# Patient Record
Sex: Male | Born: 1975 | Race: Black or African American | Hispanic: No | Marital: Married | Smoking: Current every day smoker
Health system: Southern US, Community
[De-identification: ages and names within clinical notes are randomized; demographics above are authoritative.]

## PROBLEM LIST (undated history)

## (undated) DIAGNOSIS — G4733 Obstructive sleep apnea (adult) (pediatric): Secondary | ICD-10-CM

## (undated) DIAGNOSIS — M1990 Unspecified osteoarthritis, unspecified site: Secondary | ICD-10-CM

## (undated) DIAGNOSIS — F419 Anxiety disorder, unspecified: Secondary | ICD-10-CM

## (undated) DIAGNOSIS — K5730 Diverticulosis of large intestine without perforation or abscess without bleeding: Secondary | ICD-10-CM

## (undated) DIAGNOSIS — E669 Obesity, unspecified: Secondary | ICD-10-CM

## (undated) DIAGNOSIS — F32A Depression, unspecified: Secondary | ICD-10-CM

## (undated) DIAGNOSIS — K7581 Nonalcoholic steatohepatitis (NASH): Secondary | ICD-10-CM

## (undated) DIAGNOSIS — I10 Essential (primary) hypertension: Secondary | ICD-10-CM

## (undated) DIAGNOSIS — R7303 Prediabetes: Secondary | ICD-10-CM

## (undated) DIAGNOSIS — K602 Anal fissure, unspecified: Secondary | ICD-10-CM

## (undated) HISTORY — DX: Obesity, unspecified: E66.9

## (undated) HISTORY — DX: Obstructive sleep apnea (adult) (pediatric): G47.33

## (undated) HISTORY — DX: Essential (primary) hypertension: I10

---

## 2005-10-07 ENCOUNTER — Emergency Department: Payer: Self-pay | Admitting: Emergency Medicine

## 2006-02-09 ENCOUNTER — Emergency Department (HOSPITAL_COMMUNITY): Admission: EM | Admit: 2006-02-09 | Discharge: 2006-02-09 | Payer: Self-pay | Admitting: Emergency Medicine

## 2007-02-12 IMAGING — CR DG HUMERUS 2V *L*
2 series · 2 of 2 positions shown · non-contrast
Comparison: None.

CLINICAL DATA: A pallet fell against the mid-humerus about two hours ago.  Now with upper arm and elbow pain.        
 LEFT HUMERUS - 2 VIEW:

[w humerus lat left *]
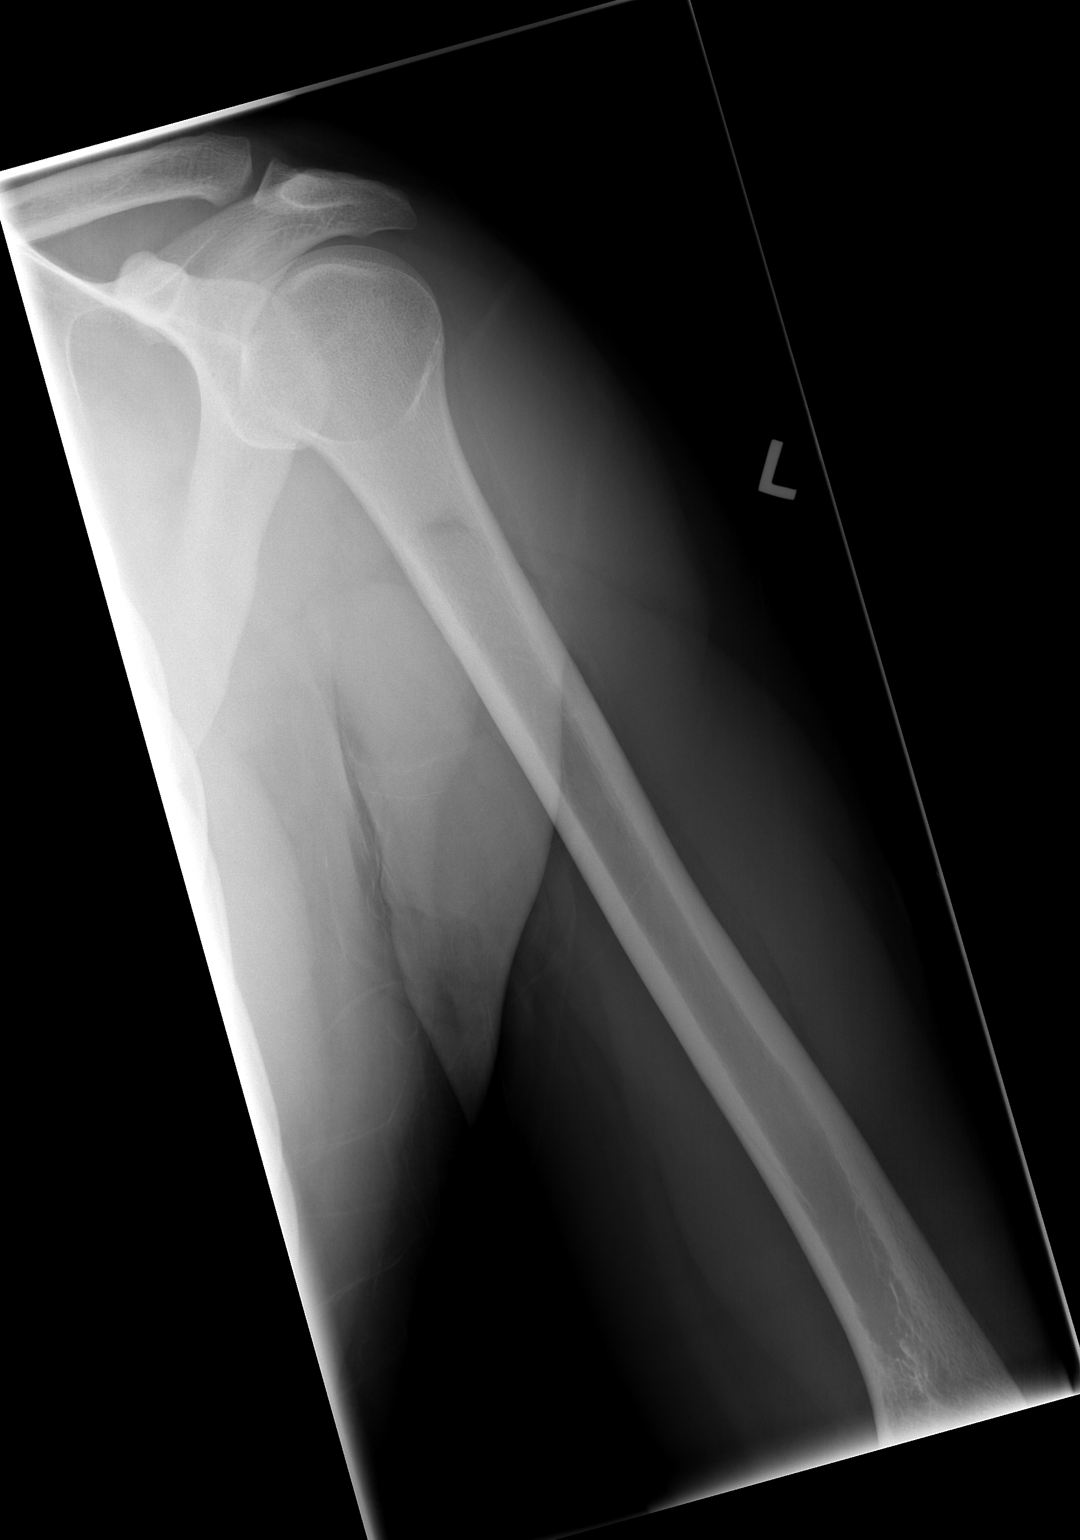

[w humerus ap left *]
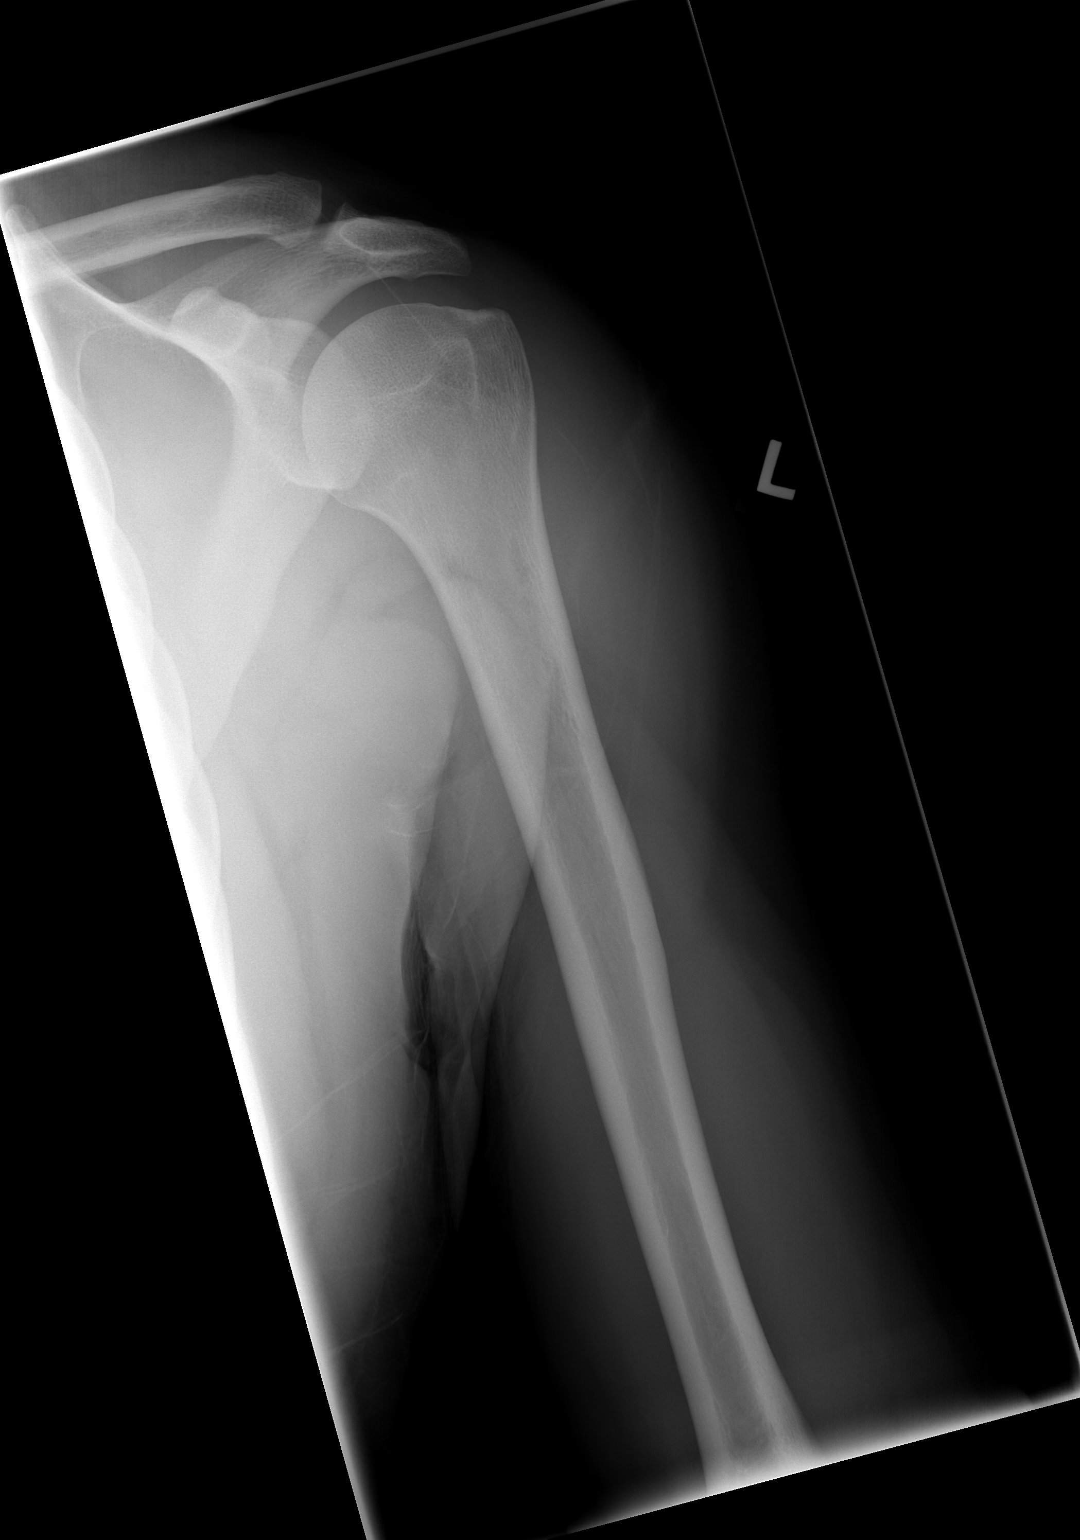

[2 of 2 positions shown; findings below may reference images not displayed]

The study is reviewed in the context of the accompanying elbow exam.  
 There is no evidence of fracture or other focal bone lesions.  Soft tissues are unremarkable.
IMPRESSION: Negative.

## 2007-04-22 ENCOUNTER — Emergency Department: Payer: Self-pay | Admitting: Emergency Medicine

## 2007-12-23 ENCOUNTER — Emergency Department: Payer: Self-pay | Admitting: Emergency Medicine

## 2009-06-20 ENCOUNTER — Emergency Department: Payer: Self-pay | Admitting: Emergency Medicine

## 2009-11-02 ENCOUNTER — Emergency Department: Payer: Self-pay | Admitting: Emergency Medicine

## 2009-11-30 ENCOUNTER — Emergency Department: Payer: Self-pay | Admitting: Emergency Medicine

## 2009-12-06 ENCOUNTER — Emergency Department: Payer: Self-pay | Admitting: Unknown Physician Specialty

## 2009-12-21 ENCOUNTER — Ambulatory Visit: Payer: Self-pay | Admitting: Surgery

## 2010-11-05 IMAGING — CT CT ABD-PELV W/ CM
1 of 2 series · 14 of 32 positions shown, 18 images · non-contrast
Comparison: none

REASON FOR EXAM: (1) left abd pain; (2) same
COMMENTS:

[Series 2: abdomen · axial · 0.81mm/px · z∈[-196,+244]mm · 14 of 98 slices shown, 18 images]
[im 5/98  soft-tissue]
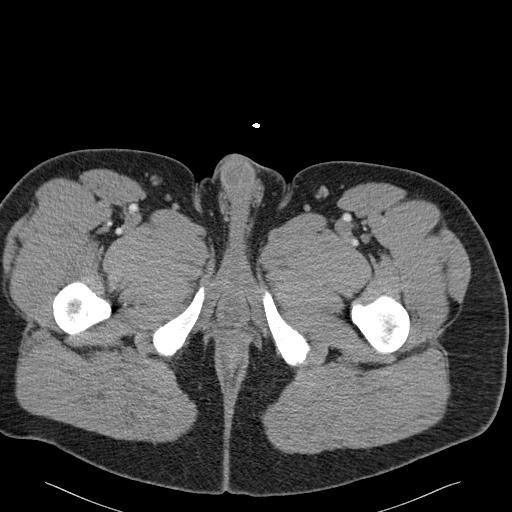
[im 5/98  bone]
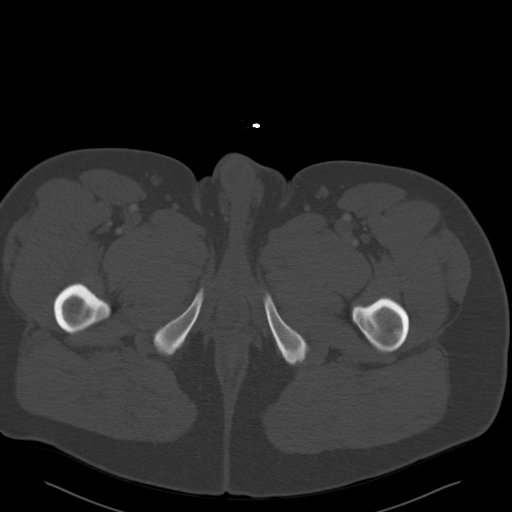
[im 13/98  soft-tissue]
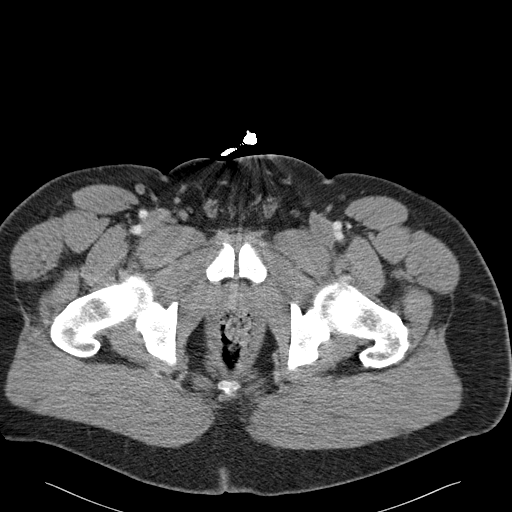
[im 21/98  soft-tissue]
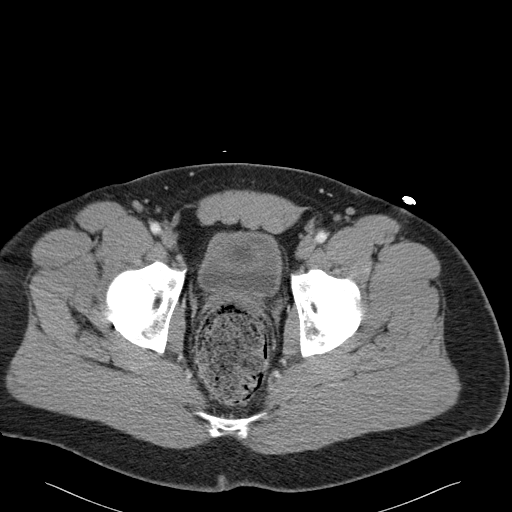
[im 29/98  soft-tissue]
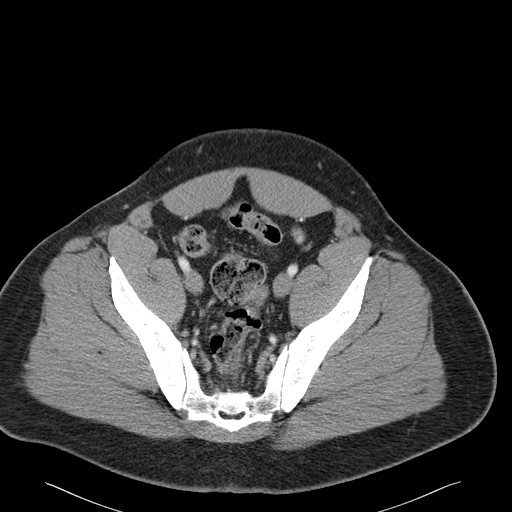
[im 37/98  soft-tissue]
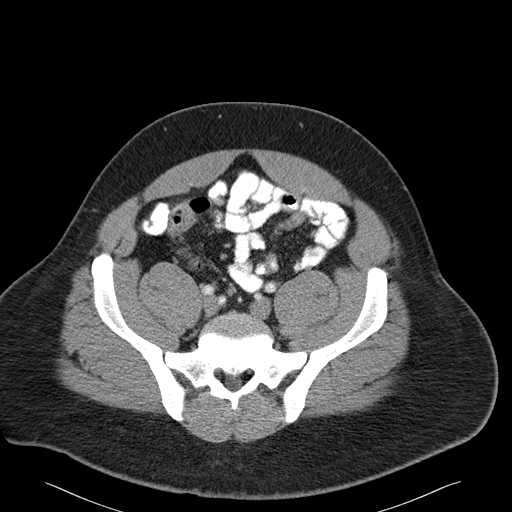
[im 45/98  soft-tissue]
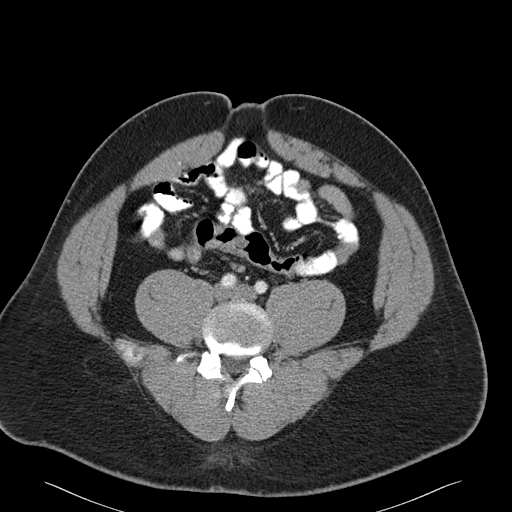
[im 53/98  soft-tissue]
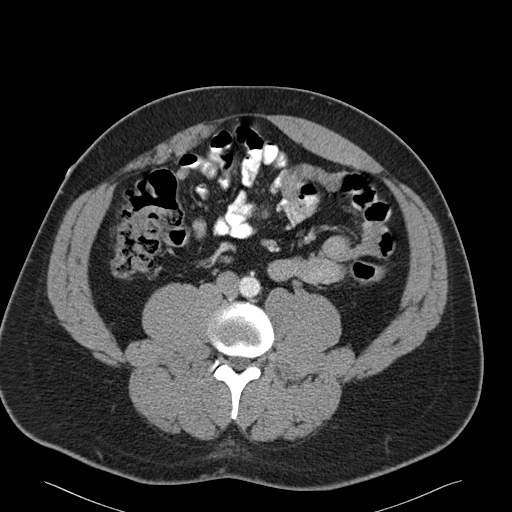
[im 61/98  soft-tissue]
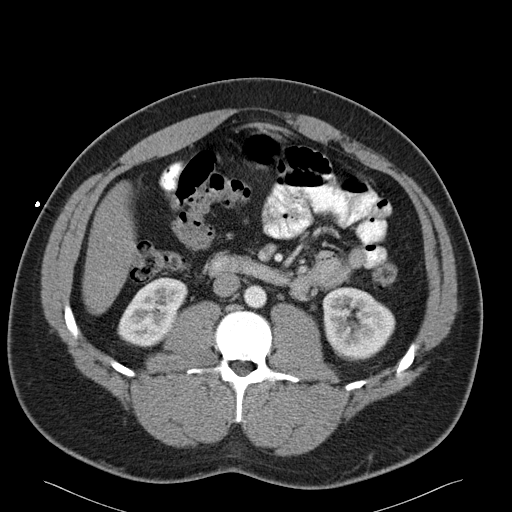
[im 69/98  soft-tissue]
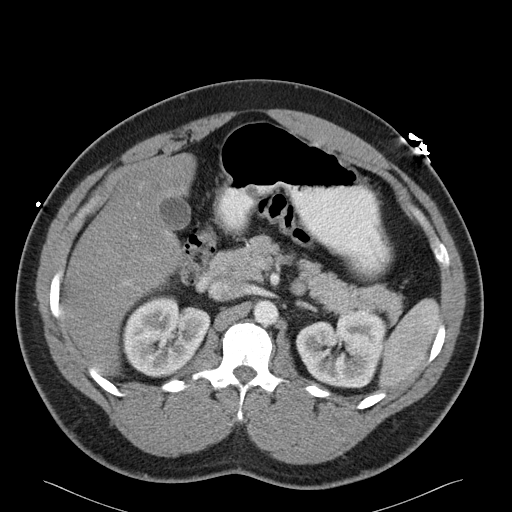
[im 69/98  bone]
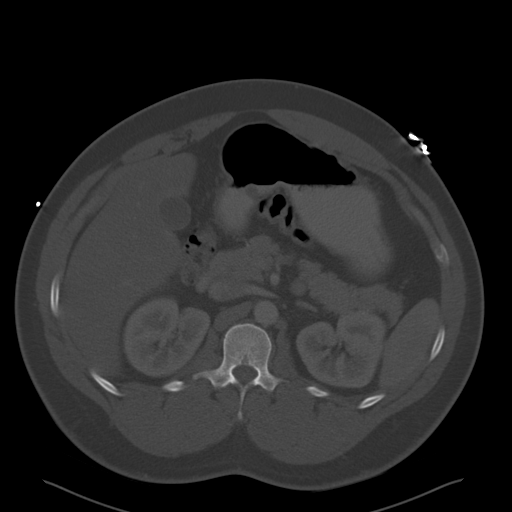
[im 77/98  soft-tissue]
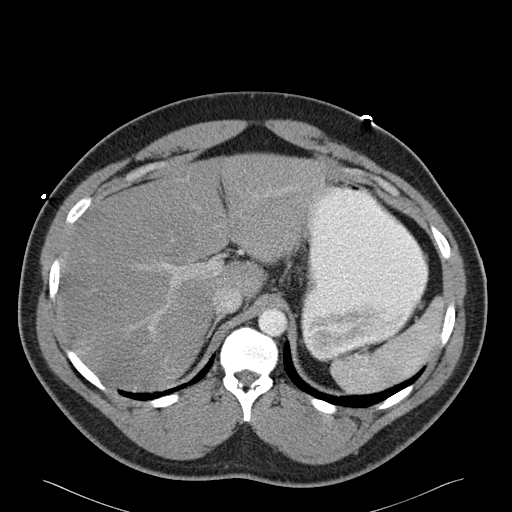
[im 81/98  lung]
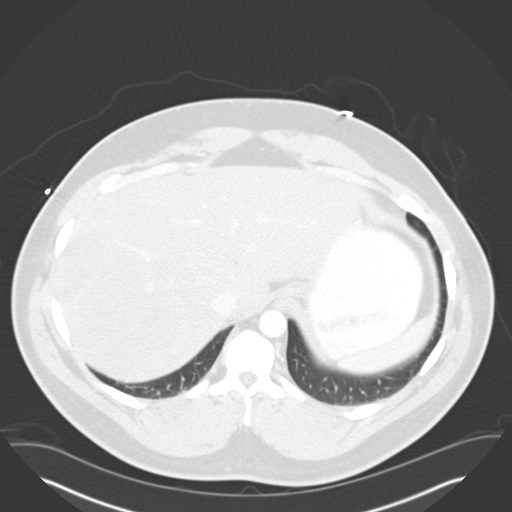
[im 85/98  soft-tissue]
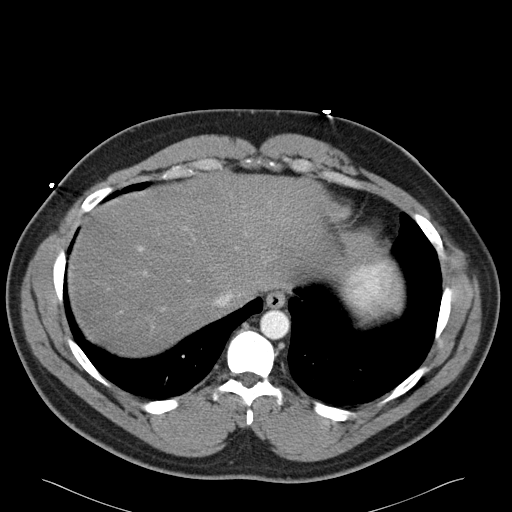
[im 85/98  lung]
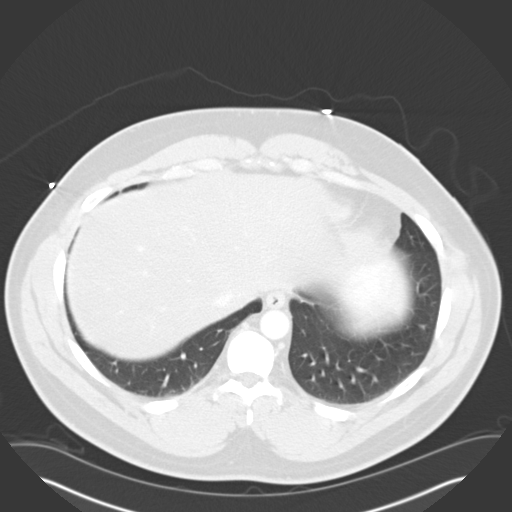
[im 89/98  lung]
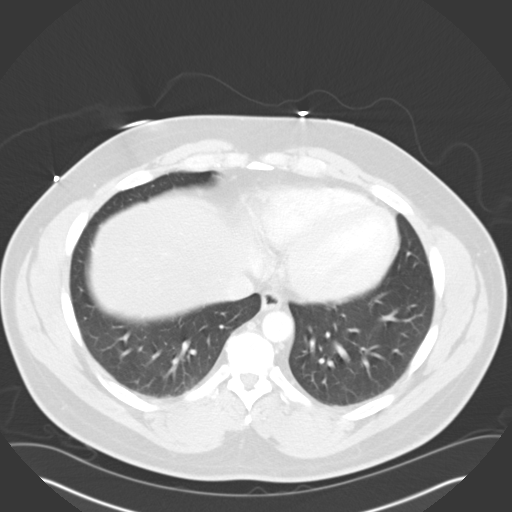
[im 93/98  soft-tissue]
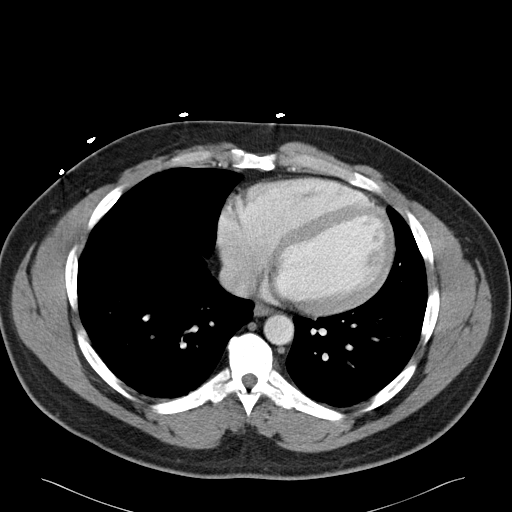
[im 93/98  lung]
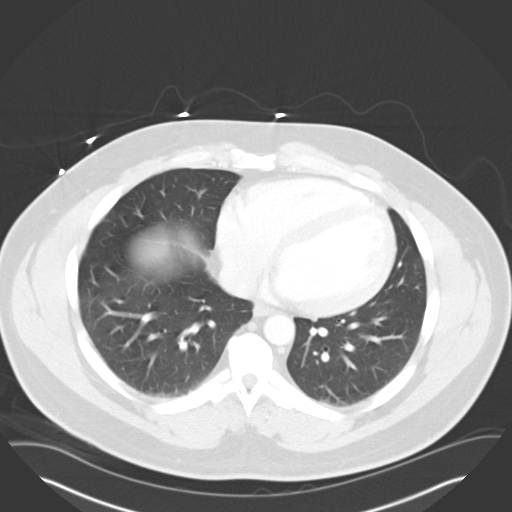

[14 of 32 positions shown; findings below may reference images not displayed]

PROCEDURE:     CT  - CT ABDOMEN / PELVIS  W  - November 02, 2009  [DATE]

RESULT:     Axial CT scanning was performed through the abdomen and pelvis
at 5 mm intervals and slice thicknesses. Comparison is made to a noncontrast
study 22 April, 2007. Review of 3-dimensional reconstructed images was
performed separately on the [employer] server monitor.

 The patient received oral contrast material as well as the IV contrast. The
orally administered contrast however has not traversed the entire small
bowel. The contrast and stool stool and gas pattern within the small bowel
is within the limits of normal. There is a broadly necked umbilical hernia
containing fat and a loop of normal appearing small bowel. The colon
exhibits a normal gas pattern. There is no evidence of obstruction or acute
inflammation. The stomach is moderately distended with fluid.

The spleen is normal in density and contour. The pancreas exhibits no acute
inflammatory change. The kidneys enhance well with no evidence of
obstruction. The perinephric fat is normal in appearance. The periaortic and
pericaval regions are normal in appearance. There are a few normal sized
inguinal lymph nodes present. I see no inguinal hernia.

The liver exhibits decreased density diffusely consistent with fatty
infiltration. The gallbladder is adequately distended with no evidence of
calcified stones. I see no adrenal masses. The caliber of the abdominal
aorta is normal. The partially distended urinary bladder is normal in
appearance. The seminal vesicles and prostate gland are normal in
appearance. The lumbar vertebral bodies are preserved in height. The lung
bases are clear.
IMPRESSION: 1. The study is limited due to lack of contrast within the colon but I do
not see evidence of acute inflammatory change of the small or large bowel.
2. The appendix is demonstrated and is normal in appearance. A few normal
sized lymph nodes in the mesentery in the right lower quadrant of the
abdomen are seen.
3. I do not see evidence of acute hepatobiliary abnormality nor acute
urinary tract abnormality.
4. There is a ventral hernia containing loops of normal appearing small
bowel.

## 2010-12-05 HISTORY — PX: UMBILICAL HERNIA REPAIR: SHX196

## 2014-04-25 ENCOUNTER — Encounter: Payer: Self-pay | Admitting: *Deleted

## 2014-04-29 ENCOUNTER — Ambulatory Visit (INDEPENDENT_AMBULATORY_CARE_PROVIDER_SITE_OTHER): Payer: BC Managed Care – PPO | Admitting: Neurology

## 2014-04-29 ENCOUNTER — Encounter: Payer: Self-pay | Admitting: Neurology

## 2014-04-29 VITALS — BP 148/90 | HR 78 | Ht 70.47 in | Wt 272.2 lb

## 2014-04-29 DIAGNOSIS — R202 Paresthesia of skin: Secondary | ICD-10-CM

## 2014-04-29 DIAGNOSIS — H539 Unspecified visual disturbance: Secondary | ICD-10-CM

## 2014-04-29 DIAGNOSIS — R209 Unspecified disturbances of skin sensation: Secondary | ICD-10-CM

## 2014-04-29 DIAGNOSIS — R269 Unspecified abnormalities of gait and mobility: Secondary | ICD-10-CM

## 2014-04-29 LAB — VITAMIN B12: Vitamin B-12: 467 pg/mL (ref 211–911)

## 2014-04-29 LAB — SEDIMENTATION RATE: Sed Rate: 1 mm/hr (ref 0–16)

## 2014-04-29 LAB — C-REACTIVE PROTEIN

## 2014-04-29 NOTE — Progress Notes (Signed)
Note faxed.

## 2014-04-29 NOTE — Patient Instructions (Addendum)
1.  MRI brain wwo contrast 2.  EMG of the right arm and leg 3.  Check blood work 4.  Return to clinic in 54-months  ELECTROMYOGRAM AND NERVE CONDUCTION STUDIES (EMG/NCS) INSTRUCTIONS  How to Prepare The neurologist conducting the EMG will need to know if you have certain medical conditions. Tell the neurologist and other EMG lab personnel if you:   Have a pacemaker or any other electrical medical device   Take blood-thinning medications   Have hemophilia, a blood-clotting disorder that causes prolonged bleeding Bathing Take a shower or bath shortly before your exam in order to remove oils from your skin. Don't apply lotions or creams before the exam.  What to Expect You'll likely be asked to change into a hospital gown for the procedure and lie down on an examination table. The following explanations can help you understand what will happen during the exam.    Electrodes. The neurologist or a technician places surface electrodes at various locations on your skin depending on where you're experiencing symptoms. Or the neurologist may insert needle electrodes at different sites depending on your symptoms.    Sensations. The electrodes will at times transmit a tiny electrical current that you may feel as a twinge or spasm. The needle electrode may cause discomfort or pain that usually ends shortly after the needle is removed. If you are concerned about discomfort or pain, you may want to talk to the neurologist about taking a short break during the exam.    Instructions. During the needle EMG, the neurologist will assess whether there is any spontaneous electrical activity when the muscle is at rest - activity that isn't present in healthy muscle tissue - and the degree of activity when you slightly contract the muscle.  He or she will give you instructions on resting and contracting a muscle at appropriate times. Depending on what muscles and nerves the neurologist is examining, he or she may ask  you to change positions during the exam.  After your EMG You may experience some temporary, minor bruising where the needle electrode was inserted into your muscle. This bruising should fade within several days. If it persists, contact your primary care doctor.

## 2014-04-29 NOTE — Progress Notes (Signed)
[facility] Clinic Note - Initial Visit   Date: 04/29/2014  Shane Erickson MRN: [id number] DOB: Mar 08, 1976   Dear Shane Levering, PA-C:  Thank you for your kind referral of Shane Erickson for consultation of numbness/tingling of the extremities. Although his history is well known to you, please allow [place] to reiterate it for the purpose of our medical record. The patient was accompanied to the clinic by self who also provides collateral information.     History of Present Illness: Shane Erickson is a 38 y.o. right-handed [place] American male with history of hyperlipidemia, depression, hypertension, OSA, and obesity presenting for evaluation of numbness and tingling of the arms and legs.    He works as a Administrator and on May 44IH, 2015 he felt as if he was having heart attack and call his primary care office who recommended that he gets is blood pressure checked which was 165/103 and go to the nearest emergency department. He was told that his labs were normal, but that he was dehydrated.  Soon afterwards, he developed numbness and tingling involving his his shoulders, arms, fingers, thighs, knees, and feet.  Symptoms are sporadic and intermittent, lasting all day but varying in location.  He denies any exacerbating or alleviating factors.  He also complains of achiness and tingling of his ankles, unsteady gait, and forgetfulness.  He is stumbling more frequently, but denies any weakness.  Denies any loss of vision, but does complain of blurry vision.    No family history of neuropathy.  His sister and aunt have multiple sclerosis.    Out-side paper records, electronic medical record, and images have been reviewed where available and summarized as:  Labs 04/10/2014:  Na 138, potassium 4.1, Glu 87, BUN 12, Cr 1.23, AST 18, ALT 20    Past Medical History  Diagnosis Date  . High cholesterol   . Chronic depression   . Hypertension   . OSA (obstructive sleep apnea)   .  Obesity     Past Surgical History  Procedure Laterality Date  . Umbilical hernia repair  2012     Medications:  Current Outpatient Prescriptions on File Prior to Visit  Medication Sig Dispense Refill  . buPROPion (WELLBUTRIN XL) 300 MG 24 hr tablet Take 300 mg by mouth daily.      . clobetasol cream (TEMOVATE) 4.74 % Apply 1 application topically 2 (two) times daily.      Marland Kitchen lisinopril-hydrochlorothiazide (PRINZIDE,ZESTORETIC) 20-12.5 MG per tablet Take 1 tablet by mouth daily.      Marland Kitchen OVER THE COUNTER MEDICATION Testosterone Booster [employer] Product 2 tablets twice a day       No current facility-administered medications on file prior to visit.    Allergies:  Allergies  Allergen Reactions  . Chapstick   . Lip Balm   . Sunscreens     Family History: Family History  Problem Relation Age of Onset  . Kidney disease Father   . CVA Father   . Heart attack Father   . Hypertension Father   . CAD Father   . Thyroid disease Mother   . Multiple sclerosis Sister   . CAD Paternal Uncle     Social History: History   Social History  . Marital Status: Legally Separated    Spouse Name: N/A    Number of Children: N/A  . Years of Education: N/A   Occupational History  . truck driver    Social History Main Topics  . Smoking status: Former  Smoker  . Smokeless tobacco: Never Used  . Alcohol Use: Yes  . Drug Use: Yes    Special: Marijuana, Cocaine, Methamphetamines  . Sexual Activity: No     Comment: abstinate since his 20's    Other Topics Concern  . Not on file   Social History Narrative  . No narrative on file    Review of Systems:  CONSTITUTIONAL: No fevers, chills, night sweats, or weight loss.   EYES: + blurry visual changes, no eye pain ENT: No hearing changes.  No history of nose bleeds.   RESPIRATORY: No cough, wheezing and shortness of breath.   CARDIOVASCULAR: Negative for chest pain, and palpitations.   GI: Negative for abdominal discomfort, blood in stools  or black stools.  No recent change in bowel habits.   GU:  No history of incontinence.   MUSCLOSKELETAL: No history of joint pain or swelling.  + myalgias.   SKIN: Negative for lesions, rash, and itching.   HEMATOLOGY/ONCOLOGY: Negative for prolonged bleeding, bruising easily, and swollen nodes.  No history of cancer.   ENDOCRINE: Negative for cold or heat intolerance, polydipsia or goiter.   PSYCH:  +depression or anxiety symptoms.   NEURO: As Above.   Vital Signs:  BP 148/90  Pulse 78  Ht 5' 10.47" (1.79 m)  Wt 272 lb 4 oz (123.492 kg)  BMI 38.54 kg/m2  SpO2 96%   General Medical Exam:   General:  Well appearing, comfortable.   Eyes/ENT: see cranial nerve examination.   Neck: No masses appreciated.  Full range of motion without tenderness.  No carotid bruits. Lhermitte's sign negative.  Respiratory:  Clear to auscultation, good air entry bilaterally.   Cardiac:  Regular rate and rhythm, no murmur.   Extremities:  No deformities, edema, or skin discoloration. Good capillary refill.   Skin:  Skin color, texture, turgor normal. No rashes or lesions.  Neurological Exam: MENTAL STATUS including orientation to time, place, person, recent and remote memory, attention span and concentration, language, and fund of knowledge is normal.  Speech is not dysarthric.  CRANIAL NERVES: II:  No visual field defects.  Unremarkable fundi.   III-IV-VI: Pupils equal round and reactive to light. No APD or INO. Normal conjugate, extra-ocular eye movements in all directions of gaze.  No nystagmus.  No ptosis.   V:  Normal facial sensation.   VII:  Normal facial symmetry and movements.  No pathologic facial reflexes.  VIII:  Normal hearing and vestibular function.   IX-X:  Normal palatal movement.   XI:  Normal shoulder shrug and head rotation.   XII:  Normal tongue strength and range of motion, no deviation or fasciculation.  MOTOR:  No atrophy, fasciculations or abnormal movements.  No pronator  drift.  Tone is normal.    Right Upper Extremity:    Left Upper Extremity:    Deltoid  5/5   Deltoid  5/5   Biceps  5/5   Biceps  5/5   Triceps  5/5   Triceps  5/5   Wrist extensors  5/5   Wrist extensors  5/5   Wrist flexors  5/5   Wrist flexors  5/5   Finger extensors  5/5   Finger extensors  5/5   Finger flexors  5/5   Finger flexors  5/5   Dorsal interossei  5/5   Dorsal interossei  5/5   Abductor pollicis  5/5   Abductor pollicis  5/5   Tone (Ashworth scale)  0  Tone (  Ashworth scale)  0   Right Lower Extremity:    Left Lower Extremity:    Hip flexors  5/5   Hip flexors  5/5   Hip extensors  5/5   Hip extensors  5/5   Knee flexors  5/5   Knee flexors  5/5   Knee extensors  5/5   Knee extensors  5/5   Dorsiflexors  5/5   Dorsiflexors  5/5   Plantarflexors  5/5   Plantarflexors  5/5   Toe extensors  5/5   Toe extensors  5/5   Toe flexors  5/5   Toe flexors  5/5   Tone (Ashworth scale)  0  Tone (Ashworth scale)  0   MSRs:  Right                                                                 Left brachioradialis 2+  brachioradialis 2+  biceps 2+  biceps 2+  triceps 2+  triceps 2+  patellar 2+  patellar 2+  ankle jerk 2+  ankle jerk 2+  Hoffman no  Hoffman no  plantar response down  plantar response down   SENSORY:  Diminished temperature in the feet bilaterally, otherwise normal and symmetric perception of light touch, pinprick, vibration, and proprioception.  Romberg's sign absent.   COORDINATION/GAIT: Normal finger-to- nose-finger and heel-to-shin.  Intact rapid alternating movements bilaterally.  Able to rise from a chair without using arms.  Gait slow, narrow based and stable. Tandem and stressed gait intact.    IMPRESSION: Mr. Lundberg is a 38 year-old man presenting for evaluation of generalized paresthesias.  His neurological examination is notable for diminished temperature in the feet, but otherwise intact.  Given the fluctuating nature of his symptoms, I will check  for demyelinating disease with MRI brain.  Generalized and sporadic paresthesias is atypical presentation for peripheral neuropathy, but with his relatively young age, it is prudent to check for neuropathy labs (including autoimmune disease, diabetes, sarcoidosis) and obtain NCS/EMG if imaging is nondiagnostic.   PLAN/RECOMMENDATIONS:  1.  MRI brain wwo contrast 2.  EMG of the right arm and leg, if imaging is negative 3.  Check ESR, CRP, ANA, ENA, ACE, vitamin B12, vitamin D25, 2hr-GTT 4.  Return to clinic in 26-month   The duration of this appointment visit was 40 minutes of face-to-face time with the patient.  Greater than 50% of this time was spent in counseling, explanation of diagnosis, planning of further management, and coordination of care.   Thank you for allowing me to participate in patient's care.  If I can answer any additional questions, I would be pleased to do so.    Sincerely,    Donika K. PPosey Pronto DO

## 2014-04-30 LAB — ANA: Anti Nuclear Antibody(ANA): POSITIVE — AB

## 2014-04-30 LAB — ANGIOTENSIN CONVERTING ENZYME: ANGIOTENSIN-CONVERTING ENZYME: 3 U/L — AB (ref 8–52)

## 2014-04-30 LAB — ENA 9 PANEL
CENTROMERE AB SCREEN: NEGATIVE
ENA SM Ab Ser-aCnc: <1
Jo-1 Antibody, IgG: <1
RIBOSOMAL P PROTEIN AB: NEGATIVE
SM/RNP: <1
SSA (RO) (ENA) ANTIBODY, IGG: NEGATIVE
SSB (La) (ENA) Antibody, IgG: <1
Scleroderma (Scl-70) (ENA) Antibody, IgG: <1

## 2014-04-30 LAB — VITAMIN D 25 HYDROXY (VIT D DEFICIENCY, FRACTURES): Vit D, 25-Hydroxy: 32 ng/mL (ref 30–89)

## 2014-04-30 LAB — ANTI-NUCLEAR AB-TITER (ANA TITER): ANA Titer 1: NEGATIVE

## 2014-05-02 ENCOUNTER — Ambulatory Visit: Payer: Self-pay | Admitting: Neurology

## 2014-05-13 ENCOUNTER — Ambulatory Visit (HOSPITAL_COMMUNITY)
Admission: RE | Admit: 2014-05-13 | Discharge: 2014-05-13 | Disposition: A | Discharge status: Home / Self Care | Payer: BC Managed Care – PPO | Source: Ambulatory Visit | Attending: Neurology | Admitting: Neurology

## 2014-05-13 DIAGNOSIS — H539 Unspecified visual disturbance: Secondary | ICD-10-CM

## 2014-05-13 DIAGNOSIS — J3489 Other specified disorders of nose and nasal sinuses: Secondary | ICD-10-CM | POA: Insufficient documentation

## 2014-05-13 DIAGNOSIS — R269 Unspecified abnormalities of gait and mobility: Secondary | ICD-10-CM | POA: Insufficient documentation

## 2014-05-13 DIAGNOSIS — R209 Unspecified disturbances of skin sensation: Secondary | ICD-10-CM

## 2014-05-13 MED ORDER — GADOBENATE DIMEGLUMINE 529 MG/ML IV SOLN
20.0000 mL | Freq: Once | INTRAVENOUS | Status: AC
  Administered 2014-05-13: 20 mL via INTRAVENOUS

## 2014-06-30 ENCOUNTER — Encounter: Payer: BC Managed Care – PPO | Admitting: Neurology

## 2014-07-01 ENCOUNTER — Encounter: Payer: Self-pay | Admitting: Neurology

## 2014-07-01 ENCOUNTER — Telehealth: Payer: Self-pay | Admitting: Neurology

## 2014-07-01 NOTE — Telephone Encounter (Signed)
Pt no showed EMG study scheduled for 06/30/14. No show letter mailed to pt. Pt still has a follow up scheduled w/ Dr. Allena KatzPatel 07/04/14 / Sherri S.

## 2014-07-03 ENCOUNTER — Telehealth: Payer: Self-pay | Admitting: Neurology

## 2014-07-03 NOTE — Telephone Encounter (Signed)
Pt called at 8:38PM on 07/02/2014 to cancel his 07/04/14 appt. His reason was due to him being out of town working, will call back to r/s

## 2014-07-04 ENCOUNTER — Ambulatory Visit: Payer: BC Managed Care – PPO | Admitting: Neurology

## 2015-11-09 ENCOUNTER — Emergency Department (HOSPITAL_COMMUNITY)
Admission: EM | Admit: 2015-11-09 | Discharge: 2015-11-09 | Disposition: A | Discharge status: Home / Self Care | Payer: Worker's Compensation | Attending: Emergency Medicine | Admitting: Emergency Medicine

## 2015-11-09 ENCOUNTER — Encounter (HOSPITAL_COMMUNITY): Payer: Self-pay | Admitting: *Deleted

## 2015-11-09 DIAGNOSIS — S8992XA Unspecified injury of left lower leg, initial encounter: Secondary | ICD-10-CM | POA: Insufficient documentation

## 2015-11-09 DIAGNOSIS — Z79899 Other long term (current) drug therapy: Secondary | ICD-10-CM | POA: Insufficient documentation

## 2015-11-09 DIAGNOSIS — M545 Low back pain, unspecified: Secondary | ICD-10-CM

## 2015-11-09 DIAGNOSIS — Y990 Civilian activity done for income or pay: Secondary | ICD-10-CM | POA: Insufficient documentation

## 2015-11-09 DIAGNOSIS — W1839XA Other fall on same level, initial encounter: Secondary | ICD-10-CM | POA: Insufficient documentation

## 2015-11-09 DIAGNOSIS — Z8659 Personal history of other mental and behavioral disorders: Secondary | ICD-10-CM | POA: Insufficient documentation

## 2015-11-09 DIAGNOSIS — S8991XA Unspecified injury of right lower leg, initial encounter: Secondary | ICD-10-CM | POA: Insufficient documentation

## 2015-11-09 DIAGNOSIS — F17200 Nicotine dependence, unspecified, uncomplicated: Secondary | ICD-10-CM | POA: Insufficient documentation

## 2015-11-09 DIAGNOSIS — E669 Obesity, unspecified: Secondary | ICD-10-CM | POA: Insufficient documentation

## 2015-11-09 DIAGNOSIS — Z8669 Personal history of other diseases of the nervous system and sense organs: Secondary | ICD-10-CM | POA: Insufficient documentation

## 2015-11-09 DIAGNOSIS — Y9389 Activity, other specified: Secondary | ICD-10-CM | POA: Insufficient documentation

## 2015-11-09 DIAGNOSIS — Z7952 Long term (current) use of systemic steroids: Secondary | ICD-10-CM | POA: Insufficient documentation

## 2015-11-09 DIAGNOSIS — Y9289 Other specified places as the place of occurrence of the external cause: Secondary | ICD-10-CM | POA: Insufficient documentation

## 2015-11-09 DIAGNOSIS — I10 Essential (primary) hypertension: Secondary | ICD-10-CM | POA: Insufficient documentation

## 2015-11-09 DIAGNOSIS — M5416 Radiculopathy, lumbar region: Secondary | ICD-10-CM | POA: Insufficient documentation

## 2015-11-09 MED ORDER — DEXAMETHASONE SODIUM PHOSPHATE 4 MG/ML IJ SOLN
10.0000 mg | Freq: Once | INTRAMUSCULAR | Status: AC
  Administered 2015-11-09: 10 mg via INTRAMUSCULAR
  Filled 2015-11-09: qty 3

## 2015-11-09 MED ORDER — DIAZEPAM 5 MG/ML IJ SOLN
10.0000 mg | Freq: Once | INTRAMUSCULAR | Status: AC
  Administered 2015-11-09: 10 mg via INTRAMUSCULAR
  Filled 2015-11-09: qty 2

## 2015-11-09 MED ORDER — FAMOTIDINE 20 MG PO TABS: 20.0000 mg | ORAL_TABLET | Freq: Two times a day (BID) | ORAL | Status: DC

## 2015-11-09 MED ORDER — NAPROXEN 500 MG PO TABS: 500.0000 mg | ORAL_TABLET | Freq: Two times a day (BID) | ORAL | Status: DC

## 2015-11-09 MED ORDER — PREDNISONE 20 MG PO TABS: ORAL_TABLET | ORAL | Status: DC

## 2015-11-09 MED ORDER — CYCLOBENZAPRINE HCL 10 MG PO TABS: 10.0000 mg | ORAL_TABLET | Freq: Three times a day (TID) | ORAL | Status: DC | PRN

## 2015-11-09 MED ORDER — KETOROLAC TROMETHAMINE 60 MG/2ML IM SOLN
60.0000 mg | Freq: Once | INTRAMUSCULAR | Status: AC
  Administered 2015-11-09: 60 mg via INTRAMUSCULAR
  Filled 2015-11-09: qty 2

## 2015-11-09 NOTE — ED Notes (Signed)
Pt up to restroom with wifes assistance

## 2015-11-09 NOTE — ED Provider Notes (Signed)
CSN: [id number]     Arrival date & time 11/09/15  0159 History   First MD Initiated Contact with Patient 11/09/15 0225    Chief Complaint  Patient presents with  . Back Pain     (Consider location/radiation/quality/duration/timing/severity/associated sxs/prior Treatment) HPI patient states "I think I broke my back". He states he had gotten to work and he was reaching over to pick up some keys out of a box in a secure area and he felt a pop in his lower back. He states he fell to his knees due to the pain. This was about 11:45 PM. He describes the pain as pressure and aching and states it goes down both his legs into his calves but is worse on the left than the right. He states moving his left leg or standing up makes the pain worse. He states nothing makes it feel better however he is sitting on the side of the bed leaning over and he states that helps minimally. He denies any nausea or vomiting. He denies any urinary rectal incontinence. He states he's never had back problems in the past.   PCP Dr Yehuda Mao  Past Medical History  Diagnosis Date  . High cholesterol   . Chronic depression   . Hypertension   . OSA (obstructive sleep apnea)   . Obesity    Past Surgical History  Procedure Laterality Date  . Umbilical hernia repair  2012   Family History  Problem Relation Age of Onset  . Kidney disease Father   . CVA Father   . Heart attack Father   . Hypertension Father   . CAD Father   . Thyroid disease Mother   . Multiple sclerosis Sister   . CAD Paternal Uncle    Social History  Substance Use Topics  . Smoking status: Current Every Day Smoker -- 0.33 packs/day  . Smokeless tobacco: Never Used  . Alcohol Use: Yes  employed Lives with spouse   Review of Systems  All other systems reviewed and are negative.     Allergies  Chapstick; Lip balm; and Sunscreens  Home Medications   Prior to Admission medications   Medication Sig Start Date End Date Taking? Authorizing  Provider  clobetasol cream (TEMOVATE) 0.05 % Apply 1 application topically 2 (two) times daily.    Historical Provider, MD  cyclobenzaprine (FLEXERIL) 10 MG tablet Take 1 tablet (10 mg total) by mouth 3 (three) times daily as needed for muscle spasms. 11/09/15   Devoria Albe, MD  famotidine (PEPCID) 20 MG tablet Take 1 tablet (20 mg total) by mouth 2 (two) times daily. 11/09/15   Devoria Albe, MD  lisinopril-hydrochlorothiazide (PRINZIDE,ZESTORETIC) 20-12.5 MG per tablet Take 1 tablet by mouth daily.    Historical Provider, MD  naproxen (NAPROSYN) 500 MG tablet Take 1 tablet (500 mg total) by mouth 2 (two) times daily. 11/09/15   Devoria Albe, MD  OVER THE COUNTER MEDICATION Testosterone Booster [employer] Product 2 tablets twice a day    Historical Provider, MD  predniSONE (DELTASONE) 20 MG tablet Take 3 po QD x 3d , then 2 po QD x 3d then 1 po QD x 3d 11/09/15   Devoria Albe, MD   BP 128/78 mmHg  Pulse 77  Temp(Src) 98.4 F (36.9 C) (Oral)  Resp 20  Ht 6' (1.829 m)  Wt 290 lb (131.543 kg)  BMI 39.32 kg/m2  SpO2 100%  Vital signs normal   Physical Exam  Constitutional: He is oriented to person, place, and  time. He appears well-developed and well-nourished.  Non-toxic appearance. He does not appear ill. No distress.  Patient is sitting on the side of the bed trying to hold still and not moved.  HENT:  Head: Normocephalic and atraumatic.  Right Ear: External ear normal.  Left Ear: External ear normal.  Nose: Nose normal. No mucosal edema or rhinorrhea.  Mouth/Throat: Oropharynx is clear and moist and mucous membranes are normal. No dental abscesses or uvula swelling.  Eyes: Conjunctivae and EOM are normal. Pupils are equal, round, and reactive to light.  Neck: Normal range of motion and full passive range of motion without pain. Neck supple.  Cardiovascular: Normal rate, regular rhythm and normal heart sounds.  Exam reveals no gallop and no friction rub.   No murmur heard. Pulmonary/Chest: Effort normal  and breath sounds normal. No respiratory distress. He has no wheezes. He has no rhonchi. He has no rales. He exhibits no tenderness and no crepitus.  Abdominal: Soft. Normal appearance and bowel sounds are normal. He exhibits no distension. There is no tenderness. There is no rebound and no guarding.  Musculoskeletal: Normal range of motion. He exhibits no edema or tenderness.  Patient is tender diffusely in his lower lumbar spine but he is extremely tender over the large paraspinous lumbar muscles masses and he does not like to do range of motion because it causes pain. He has not had much pain over the SI joints bilaterally. His patellar reflexes are 0 bilaterally however he is very tense and even with distraction they were reflexes were 0. Patient has bilateral pain with straight leg raising worse on the left than the right.  Neurological: He is alert and oriented to person, place, and time. He has normal strength. No cranial nerve deficit.  Skin: Skin is warm, dry and intact. No rash noted. No erythema. No pallor.  Psychiatric: He has a normal mood and affect. His speech is normal and behavior is normal. His mood appears not anxious.  Nursing note and vitals reviewed.   ED Course  Procedures (including critical care time)  Medications  diazepam (VALIUM) injection 10 mg (10 mg Intramuscular Given 11/09/15 0315)  dexamethasone (DECADRON) injection 10 mg (10 mg Intramuscular Given 11/09/15 0316)  ketorolac (TORADOL) injection 60 mg (60 mg Intramuscular Given 11/09/15 0316)   Patient was given Valium IM, Decadron IM, and Toradol for pain. X-rays were not done since there was no trauma that would have caused a lumbar spine fracture. We discussed if he's not improving he may need a MRI in the future however with the acute pain most patients do improve with conservative treatment.   Recheck 04:10 patient is laying flat on the stretcher sleeping. His pain is improved.    MDM   Final diagnoses:   Acute low back pain  Lumbar radiculopathy    New Prescriptions   CYCLOBENZAPRINE (FLEXERIL) 10 MG TABLET    Take 1 tablet (10 mg total) by mouth 3 (three) times daily as needed for muscle spasms.   FAMOTIDINE (PEPCID) 20 MG TABLET    Take 1 tablet (20 mg total) by mouth 2 (two) times daily.   NAPROXEN (NAPROSYN) 500 MG TABLET    Take 1 tablet (500 mg total) by mouth 2 (two) times daily.   PREDNISONE (DELTASONE) 20 MG TABLET    Take 3 po QD x 3d , then 2 po QD x 3d then 1 po QD x 3d    Plan discharge  Devoria Albe, MD, Armando Gang  Devoria AlbeIva Cady Hafen, MD 11/09/15 [phone]

## 2015-11-09 NOTE — ED Notes (Signed)
Pt states he was bent down to put keys in work box and when he lifted back up he heard a pop and is having severe pain in lower back and down bilateral legs; pt states the pain is worse going down his left leg

## 2015-11-09 NOTE — Discharge Instructions (Signed)
Use ice and heat on your back. Take the cyclobenzprine, prednisone and pepcid until gone. When the prednisone is gone you can continue on the naproxen. Follow up with workman's comp today.    Back Injury Prevention Back injuries can be very painful. They can also be difficult to heal. After having one back injury, you are more likely to injure your back again. It is important to learn how to avoid injuring or re-injuring your back. The following tips can help you to prevent a back injury. WHAT SHOULD I KNOW ABOUT PHYSICAL FITNESS?  Exercise for 30 minutes per day on most days of the week or as told by your doctor. Make sure to:  Do aerobic exercises, such as walking, jogging, biking, or swimming.  Do exercises that increase balance and strength, such as tai chi and yoga.  Do stretching exercises. This helps with flexibility.  Try to develop strong belly (abdominal) muscles. Your belly muscles help to support your back.  Stay at a healthy weight. This helps to decrease your risk of a back injury. WHAT SHOULD I KNOW ABOUT MY DIET?  Talk with your doctor about your overall diet. Take supplements and vitamins only as told by your doctor.  Talk with your doctor about how much calcium and vitamin D you need each day. These nutrients help to prevent weakening of the bones (osteoporosis).  Include good sources of calcium in your diet, such as:  Dairy products.  Green leafy vegetables.  Products that have had calcium added to them (fortified).  Include good sources of vitamin D in your diet, such as:  Milk.  Foods that have had vitamin D added to them. WHAT SHOULD I KNOW ABOUT MY POSTURE?  Sit up straight and stand up straight. Avoid leaning forward when you sit or hunching over when you stand.  Choose chairs that have good low-back (lumbar) support.  If you work at a desk, sit close to it so you do not need to lean over. Keep your chin tucked in. Keep your neck drawn back. Keep  your elbows bent so your arms look like the letter "L" (right angle).  Sit high and close to the steering wheel when you drive. Add a low-back support to your car seat, if needed.  Avoid sitting or standing in one position for very long. Take breaks to get up, stretch, and walk around at least one time every hour. Take breaks every hour if you are driving for long periods of time.  Sleep on your side with your knees slightly bent, or sleep on your back with a pillow under your knees. Do not lie on the front of your body to sleep. WHAT SHOULD I KNOW ABOUT LIFTING, TWISTING, AND REACHING Lifting and Heavy Lifting  Avoid heavy lifting, especially lifting over and over again. If you must do heavy lifting:  Stretch before lifting.  Work slowly.  Rest between lifts.  Use a tool such as a cart or a dolly to move objects if one is available.  Make several small trips instead of carrying one heavy load.  Ask for help when you need it, especially when moving big objects.  Follow these steps when lifting:  Stand with your feet shoulder-width apart.  Get as close to the object as you can. Do not pick up a heavy object that is far from your body.  Use handles or lifting straps if they are available.  Bend at your knees. Squat down, but keep your heels off  the floor.  Keep your shoulders back. Keep your chin tucked in. Keep your back straight.  Lift the object slowly while you tighten the muscles in your legs, belly, and butt. Keep the object as close to the center of your body as possible.  Follow these steps when putting down a heavy load:  Stand with your feet shoulder-width apart.  Lower the object slowly while you tighten the muscles in your legs, belly, and butt. Keep the object as close to the center of your body as possible.  Keep your shoulders back. Keep your chin tucked in. Keep your back straight.  Bend at your knees. Squat down, but keep your heels off the floor.  Use  handles or lifting straps if they are available. Twisting and Reaching  Avoid lifting heavy objects above your waist.  Do not twist at your waist while you are lifting or carrying a load. If you need to turn, move your feet.  Do not bend over without bending at your knees.  Avoid reaching over your head, across a table, or for an object on a high surface.  WHAT ARE SOME OTHER TIPS?  Avoid wet floors and icy ground. Keep sidewalks clear of ice to prevent falls.   Do not sleep on a mattress that is too soft or too hard.   Keep items that you use often within easy reach.   Put heavier objects on shelves at waist level, and put lighter objects on lower or higher shelves.  Find ways to lower your stress, such as:  Exercise.  Massage.  Relaxation techniques.  Talk with your doctor if you feel anxious or depressed. These conditions can make back pain worse.  Wear flat heel shoes with cushioned soles.  Avoid making quick (sudden) movements.  Use both shoulder straps when carrying a backpack.  Do not use any tobacco products, including cigarettes, chewing tobacco, or electronic cigarettes. If you need help quitting, ask your doctor.   This information is not intended to replace advice given to you by your health care provider. Make sure you discuss any questions you have with your health care provider.   Document Released: 05/09/2008 Document Revised: 04/07/2015 Document Reviewed: 11/25/2014 Elsevier Interactive Patient Education 2016 [place] therapy can help ease sore, stiff, injured, and tight muscles and joints. Heat relaxes your muscles, which may help ease your pain. Heat therapy should only be used on old, pre-existing, or long-lasting (chronic) injuries. Do not use heat therapy unless told by your doctor. HOW TO USE HEAT THERAPY There are several different kinds of heat therapy, including:  Moist heat pack.  Warm water bath.  Hot water  bottle.  Electric heating pad.  Heated gel pack.  Heated wrap.  Electric heating pad. GENERAL HEAT THERAPY RECOMMENDATIONS   Do not sleep while using heat therapy. Only use heat therapy while you are awake.  Your skin may turn pink while using heat therapy. Do not use heat therapy if your skin turns red.  Do not use heat therapy if you have new pain.  High heat or long exposure to heat can cause burns. Be careful when using heat therapy to avoid burning your skin.  Do not use heat therapy on areas of your skin that are already irritated, such as with a rash or sunburn. GET HELP IF:   You have blisters, redness, swelling (puffiness), or numbness.  You have new pain.  Your pain is worse. MAKE SURE YOU:  Understand  these instructions.  Will watch your condition.  Will get help right away if you are not doing well or get worse.   This information is not intended to replace advice given to you by your health care provider. Make sure you discuss any questions you have with your health care provider.   Document Released: 02/13/2012 Document Revised: 12/12/2014 Document Reviewed: 01/14/2014 Elsevier Interactive Patient Education [employer].

## 2016-02-01 ENCOUNTER — Other Ambulatory Visit: Payer: Self-pay | Admitting: Gastroenterology

## 2016-02-01 DIAGNOSIS — K6289 Other specified diseases of anus and rectum: Secondary | ICD-10-CM

## 2016-02-03 ENCOUNTER — Ambulatory Visit
Admission: RE | Admit: 2016-02-03 | Discharge: 2016-02-03 | Disposition: A | Discharge status: Home / Self Care | Payer: BLUE CROSS/BLUE SHIELD | Source: Ambulatory Visit | Attending: Gastroenterology | Admitting: Gastroenterology

## 2016-02-03 DIAGNOSIS — K6289 Other specified diseases of anus and rectum: Secondary | ICD-10-CM

## 2016-02-03 MED ORDER — IOPAMIDOL (ISOVUE-300) INJECTION 61%
125.0000 mL | Freq: Once | INTRAVENOUS | Status: AC | PRN
  Administered 2016-02-03: 125 mL via INTRAVENOUS

## 2016-03-05 DIAGNOSIS — K602 Anal fissure, unspecified: Secondary | ICD-10-CM

## 2016-03-05 HISTORY — DX: Anal fissure, unspecified: K60.2

## 2016-03-31 ENCOUNTER — Encounter (HOSPITAL_BASED_OUTPATIENT_CLINIC_OR_DEPARTMENT_OTHER): Payer: Self-pay | Admitting: *Deleted

## 2016-03-31 NOTE — Pre-Procedure Instructions (Signed)
To come for BMET and EKG 

## 2016-04-04 ENCOUNTER — Other Ambulatory Visit: Payer: Self-pay

## 2016-04-04 ENCOUNTER — Encounter (HOSPITAL_BASED_OUTPATIENT_CLINIC_OR_DEPARTMENT_OTHER)
Admission: RE | Admit: 2016-04-04 | Discharge: 2016-04-04 | Disposition: A | Discharge status: Home / Self Care | Payer: BLUE CROSS/BLUE SHIELD | Source: Ambulatory Visit | Attending: Surgery | Admitting: Surgery

## 2016-04-04 DIAGNOSIS — K602 Anal fissure, unspecified: Secondary | ICD-10-CM | POA: Diagnosis present

## 2016-04-04 DIAGNOSIS — I10 Essential (primary) hypertension: Secondary | ICD-10-CM | POA: Diagnosis not present

## 2016-04-04 DIAGNOSIS — F17200 Nicotine dependence, unspecified, uncomplicated: Secondary | ICD-10-CM | POA: Diagnosis not present

## 2016-04-04 LAB — BASIC METABOLIC PANEL
Anion gap: 10 (ref 5–15)
BUN: 11 mg/dL (ref 6–20)
CHLORIDE: 106 mmol/L (ref 101–111)
CO2: 28 mmol/L (ref 22–32)
CREATININE: 1.24 mg/dL (ref 0.61–1.24)
Calcium: 9.3 mg/dL (ref 8.9–10.3)
GFR calc Af Amer: >60 mL/min (ref >60)
GFR calc non Af Amer: >60 mL/min (ref >60)
GLUCOSE: 89 mg/dL (ref 65–99)
POTASSIUM: 3.8 mmol/L (ref 3.5–5.1)
Sodium: 144 mmol/L (ref 135–145)

## 2016-04-05 ENCOUNTER — Ambulatory Visit: Payer: Self-pay | Admitting: Surgery

## 2016-04-07 ENCOUNTER — Ambulatory Visit (HOSPITAL_BASED_OUTPATIENT_CLINIC_OR_DEPARTMENT_OTHER): Payer: BLUE CROSS/BLUE SHIELD | Admitting: Anesthesiology

## 2016-04-07 ENCOUNTER — Encounter (HOSPITAL_BASED_OUTPATIENT_CLINIC_OR_DEPARTMENT_OTHER): Payer: Self-pay | Admitting: Anesthesiology

## 2016-04-07 ENCOUNTER — Ambulatory Visit (HOSPITAL_BASED_OUTPATIENT_CLINIC_OR_DEPARTMENT_OTHER)
Admission: RE | Admit: 2016-04-07 | Discharge: 2016-04-07 | Disposition: A | Discharge status: Home / Self Care | Payer: BLUE CROSS/BLUE SHIELD | Source: Ambulatory Visit | Attending: Surgery | Admitting: Surgery

## 2016-04-07 ENCOUNTER — Encounter (HOSPITAL_BASED_OUTPATIENT_CLINIC_OR_DEPARTMENT_OTHER)
Admission: RE | Disposition: A | Discharge status: Home / Self Care | Payer: Self-pay | Source: Ambulatory Visit | Attending: Surgery

## 2016-04-07 DIAGNOSIS — K602 Anal fissure, unspecified: Secondary | ICD-10-CM | POA: Diagnosis not present

## 2016-04-07 DIAGNOSIS — F17200 Nicotine dependence, unspecified, uncomplicated: Secondary | ICD-10-CM | POA: Insufficient documentation

## 2016-04-07 DIAGNOSIS — I10 Essential (primary) hypertension: Secondary | ICD-10-CM | POA: Insufficient documentation

## 2016-04-07 DIAGNOSIS — Z9889 Other specified postprocedural states: Secondary | ICD-10-CM

## 2016-04-07 HISTORY — DX: Anal fissure, unspecified: K60.2

## 2016-04-07 HISTORY — PX: SPHINCTEROTOMY: SHX5279

## 2016-04-07 SURGERY — SPHINCTEROTOMY, ANAL
Anesthesia: General | Site: Rectum

## 2016-04-07 MED ORDER — LIDOCAINE 2% (20 MG/ML) 5 ML SYRINGE
INTRAMUSCULAR | Status: AC
  Filled 2016-04-07: qty 5

## 2016-04-07 MED ORDER — ONDANSETRON HCL 4 MG/2ML IJ SOLN
INTRAMUSCULAR | Status: AC
  Filled 2016-04-07: qty 2

## 2016-04-07 MED ORDER — BUPIVACAINE-EPINEPHRINE (PF) 0.5% -1:200000 IJ SOLN
INTRAMUSCULAR | Status: AC
  Filled 2016-04-07: qty 30

## 2016-04-07 MED ORDER — LACTATED RINGERS IV SOLN
INTRAVENOUS | Status: DC
  Administered 2016-04-07: 12:00:00 via INTRAVENOUS

## 2016-04-07 MED ORDER — GLYCOPYRROLATE 0.2 MG/ML IJ SOLN: 0.2000 mg | Freq: Once | INTRAMUSCULAR | Status: DC | PRN

## 2016-04-07 MED ORDER — LIDOCAINE HCL (CARDIAC) 20 MG/ML IV SOLN
INTRAVENOUS | Status: DC | PRN
  Administered 2016-04-07: 50 mg via INTRAVENOUS

## 2016-04-07 MED ORDER — MIDAZOLAM HCL 2 MG/2ML IJ SOLN
INTRAMUSCULAR | Status: AC
  Filled 2016-04-07: qty 2

## 2016-04-07 MED ORDER — PROPOFOL 10 MG/ML IV BOLUS
INTRAVENOUS | Status: AC
  Filled 2016-04-07: qty 20

## 2016-04-07 MED ORDER — POVIDONE-IODINE 10 % EX OINT
TOPICAL_OINTMENT | CUTANEOUS | Status: DC | PRN
  Administered 2016-04-07: 1 via TOPICAL

## 2016-04-07 MED ORDER — MEPERIDINE HCL 25 MG/ML IJ SOLN: 6.2500 mg | INTRAMUSCULAR | Status: DC | PRN

## 2016-04-07 MED ORDER — CHLORHEXIDINE GLUCONATE 4 % EX LIQD: 1.0000 "application " | Freq: Once | CUTANEOUS | Status: DC

## 2016-04-07 MED ORDER — FENTANYL CITRATE (PF) 100 MCG/2ML IJ SOLN
INTRAMUSCULAR | Status: AC
  Filled 2016-04-07: qty 2

## 2016-04-07 MED ORDER — OXYCODONE HCL 5 MG/5ML PO SOLN: 5.0000 mg | Freq: Once | ORAL | Status: DC | PRN

## 2016-04-07 MED ORDER — MIDAZOLAM HCL 2 MG/2ML IJ SOLN
1.0000 mg | INTRAMUSCULAR | Status: DC | PRN
  Administered 2016-04-07: 2 mg via INTRAVENOUS

## 2016-04-07 MED ORDER — SCOPOLAMINE 1 MG/3DAYS TD PT72: 1.0000 | MEDICATED_PATCH | Freq: Once | TRANSDERMAL | Status: DC | PRN

## 2016-04-07 MED ORDER — ONDANSETRON HCL 4 MG/2ML IJ SOLN
INTRAMUSCULAR | Status: DC | PRN
  Administered 2016-04-07: 4 mg via INTRAVENOUS

## 2016-04-07 MED ORDER — PROPOFOL 10 MG/ML IV BOLUS
INTRAVENOUS | Status: DC | PRN
  Administered 2016-04-07: 300 mg via INTRAVENOUS

## 2016-04-07 MED ORDER — BUPIVACAINE LIPOSOME 1.3 % IJ SUSP
INTRAMUSCULAR | Status: AC
Start: 2016-04-07 — End: 2016-04-07
  Filled 2016-04-07: qty 20

## 2016-04-07 MED ORDER — POVIDONE-IODINE 10 % EX OINT
TOPICAL_OINTMENT | CUTANEOUS | Status: AC
  Filled 2016-04-07: qty 28.35

## 2016-04-07 MED ORDER — DEXAMETHASONE SODIUM PHOSPHATE 4 MG/ML IJ SOLN
INTRAMUSCULAR | Status: DC | PRN
  Administered 2016-04-07: 10 mg via INTRAVENOUS

## 2016-04-07 MED ORDER — FENTANYL CITRATE (PF) 100 MCG/2ML IJ SOLN
50.0000 ug | INTRAMUSCULAR | Status: DC | PRN
  Administered 2016-04-07: 50 ug via INTRAVENOUS
  Administered 2016-04-07: 100 ug via INTRAVENOUS

## 2016-04-07 MED ORDER — BUPIVACAINE LIPOSOME 1.3 % IJ SUSP
INTRAMUSCULAR | Status: DC | PRN
  Administered 2016-04-07: 20 mL

## 2016-04-07 MED ORDER — HYDROCODONE-ACETAMINOPHEN 5-325 MG PO TABS: 1.0000 | ORAL_TABLET | ORAL | Status: DC | PRN

## 2016-04-07 MED ORDER — OXYCODONE HCL 5 MG PO TABS: 5.0000 mg | ORAL_TABLET | Freq: Once | ORAL | Status: DC | PRN

## 2016-04-07 MED ORDER — HYDROMORPHONE HCL 1 MG/ML IJ SOLN: 0.2500 mg | INTRAMUSCULAR | Status: DC | PRN

## 2016-04-07 SURGICAL SUPPLY — 40 items
BLADE SURG 15 STRL LF DISP TIS (BLADE) ×1 IMPLANT
BLADE SURG 15 STRL SS (BLADE) ×2
CANISTER SUCT 1200ML W/VALVE (MISCELLANEOUS) ×3 IMPLANT
COVER MAYO STAND STRL (DRAPES) IMPLANT
DECANTER SPIKE VIAL GLASS SM (MISCELLANEOUS) IMPLANT
DRSG PAD ABDOMINAL 8X10 ST (GAUZE/BANDAGES/DRESSINGS) ×3 IMPLANT
ELECT REM PT RETURN 9FT ADLT (ELECTROSURGICAL) ×3
ELECTRODE REM PT RTRN 9FT ADLT (ELECTROSURGICAL) ×1 IMPLANT
GAUZE PETROLATUM 1 X8 (GAUZE/BANDAGES/DRESSINGS) IMPLANT
GLOVE BIO SURGEON STRL SZ8 (GLOVE) ×3 IMPLANT
GLOVE EXAM NITRILE EXT CUFF MD (GLOVE) ×3 IMPLANT
GLOVE SURG SS PI 7.0 STRL IVOR (GLOVE) ×3 IMPLANT
GOWN STRL REUS W/ TWL LRG LVL3 (GOWN DISPOSABLE) ×1 IMPLANT
GOWN STRL REUS W/ TWL XL LVL3 (GOWN DISPOSABLE) ×1 IMPLANT
GOWN STRL REUS W/TWL LRG LVL3 (GOWN DISPOSABLE) ×2
GOWN STRL REUS W/TWL XL LVL3 (GOWN DISPOSABLE) ×2
IV CATH PLACEMENT UNIT 16 GA (IV SOLUTION) IMPLANT
NEEDLE HYPO 25X1 1.5 SAFETY (NEEDLE) ×3 IMPLANT
NS IRRIG 1000ML POUR BTL (IV SOLUTION) ×3 IMPLANT
PACK BASIN DAY SURGERY FS (CUSTOM PROCEDURE TRAY) ×3 IMPLANT
PACK LITHOTOMY IV (CUSTOM PROCEDURE TRAY) ×3 IMPLANT
PENCIL BUTTON HOLSTER BLD 10FT (ELECTRODE) ×3 IMPLANT
SHEET MEDIUM DRAPE 40X70 STRL (DRAPES) ×3 IMPLANT
SPONGE GAUZE 4X4 12PLY STER LF (GAUZE/BANDAGES/DRESSINGS) ×6 IMPLANT
SURGILUBE 2OZ TUBE FLIPTOP (MISCELLANEOUS) ×3 IMPLANT
SUT CHROMIC 2 0 SH (SUTURE) IMPLANT
SUT CHROMIC 3 0 SH 27 (SUTURE) IMPLANT
SUT SILK 2 0 TIES 17X18 (SUTURE)
SUT SILK 2-0 18XBRD TIE BLK (SUTURE) IMPLANT
SUT VIC AB 3-0 PS1 18 (SUTURE)
SUT VIC AB 3-0 PS1 18XBRD (SUTURE) IMPLANT
SYR CONTROL 10ML LL (SYRINGE) ×3 IMPLANT
SYRINGE 10CC LL (SYRINGE) IMPLANT
TOWEL OR 17X24 6PK STRL BLUE (TOWEL DISPOSABLE) ×3 IMPLANT
TRAY DSU PREP LF (CUSTOM PROCEDURE TRAY) ×3 IMPLANT
TRAY PROCTOSCOPIC FIBER OPTIC (SET/KITS/TRAYS/PACK) IMPLANT
TUBE CONNECTING 20'X1/4 (TUBING) ×1
TUBE CONNECTING 20X1/4 (TUBING) ×2 IMPLANT
UNDERPAD 30X30 (UNDERPADS AND DIAPERS) ×3 IMPLANT
YANKAUER SUCT BULB TIP NO VENT (SUCTIONS) ×3 IMPLANT

## 2016-04-07 NOTE — Anesthesia Procedure Notes (Signed)
Procedure Name: LMA Insertion Date/Time: 04/07/2016 12:28 PM Performed by: Caren MacadamARTER, Jessina Marse W Pre-anesthesia Checklist: Patient identified, Emergency Drugs available, Suction available and Patient being monitored Patient Re-evaluated:Patient Re-evaluated prior to inductionOxygen Delivery Method: Circle System Utilized Preoxygenation: Pre-oxygenation with 100% oxygen Intubation Type: IV induction Ventilation: Mask ventilation without difficulty LMA: LMA inserted LMA Size: 5.0 Number of attempts: 1 Airway Equipment and Method: Bite block Placement Confirmation: positive ETCO2 and breath sounds checked- equal and bilateral Tube secured with: Tape Dental Injury: Teeth and Oropharynx as per pre-operative assessment

## 2016-04-07 NOTE — H&P (Signed)
Sidonie DickensShaun Oestreicher 02/16/2016 4:29 PM Location: [facility] Patient #: [id number] DOB: 05/04/1976 Undefined / Language: [place] / Race: Black or African American Male   History of Present Illness Molli Hazard(Kentravious Lipford B. Daphine DeutscherMartin MD; 02/16/2016 5:14 PM) Patient words: New-anal fissure.  The patient is a 40 year old male who presents with an anal fissure. He comes in with his wife and he has had many sleepless nights with pain after defecation.    Problem List/Past Medical Doristine Devoid(Chemira Jones, CMA; 02/16/2016 4:36 PM) Back Pain Hypertension Stomach Ulcer  Past Surgical History Doristine Devoid(Chemira Jones, CMA; 02/16/2016 4:37 PM) Umbilical Hernia Repair  Allergies Doristine Devoid(Chemira Jones, CMA; 02/16/2016 4:35 PM) No Known Drug Allergies03/14/2017  Medication History Doristine Devoid(Chemira Jones, CMA; 02/16/2016 4:35 PM) Oxycodone-Acetaminophen (7.5-325MG  Tablet, Oral) Active. Lisinopril-Hydrochlorothiazide (20-12.5MG  Tablet, Oral) Active. Medications Reconciled  Social History Doristine Devoid(Chemira Jones, CMA; 02/16/2016 4:38 PM) Current tobacco use Current every day smoker. Alcohol use Occasional alcohol use. Caffeine use Coffee, Tea, Carbonated beverages. No drug use  Family History Doristine Devoid(Chemira Jones, CMA; 02/16/2016 4:39 PM) Arthritis Father. Bleeding disorder Father. Hypertension Father, Brother. Stroke / CVA of the Brain Father. Thyroid problems Mother, Sister.  Vitals (Chemira Jones CMA; 02/16/2016 4:35 PM) 02/16/2016 4:35 PM Weight: 276.8 lb Height: 72in Body Surface Area: 2.45 m Body Mass Index: 37.54 kg/m  Temp.: 99.58F(Oral)  BP: 120/84 (Sitting, Left Arm, Standard)       Physical Exam (Alvon Nygaard B. Daphine DeutscherMartin MD; 02/16/2016 5:15 PM) Chest and Lung Exam Note: clear   Cardiovascular Note: SR without murmurs   Rectal Note: very tight anal sphicter; posterior ridge and tenderness consistent with an anal fissure     Assessment & Plan Molli Hazard(Madalen Gavin B. Daphine DeutscherMartin MD; 02/16/2016 5:17 PM) ANAL FISSURE  (K60.2) Impression: I have discussed EUA and lateral internal sphicterotomy. He understands about possible incontinence issues and what is causing his pain. The TNG does not help the spasm very much.

## 2016-04-07 NOTE — Transfer of Care (Signed)
Immediate Anesthesia Transfer of Care Note  Patient: Shane Erickson  Procedure(s) Performed: Procedure(s): LATERAL INTERNAL SPHINCTEROTOMY (N/A) EXAM UNDER ANESTHESIA (N/A)  Patient Location: PACU  Anesthesia Type:General  Level of Consciousness: awake, alert  and oriented  Airway & Oxygen Therapy: Patient Spontanous Breathing and Patient connected to face mask oxygen  Post-op Assessment: Report given to RN and Post -op Vital signs reviewed and stable  Post vital signs: Reviewed and stable  Last Vitals:  Filed Vitals:   04/07/16 1148 04/07/16 1326  BP: 130/86 157/89  Pulse: 70 95  Temp: 36.8 C   Resp: 20 16    Last Pain:  Filed Vitals:   04/07/16 1329  PainSc: 3       Patients Stated Pain Goal: 4 (04/07/16 1148)  Complications: No apparent anesthesia complications

## 2016-04-07 NOTE — Anesthesia Preprocedure Evaluation (Signed)
Anesthesia Evaluation  Patient identified by MRN, date of birth, ID band Patient awake    Reviewed: Allergy & Precautions, NPO status , Patient's Chart, lab work & pertinent test results  Airway Mallampati: I  TM Distance: >3 FB Neck ROM: Full    Dental  (+) Teeth Intact, Dental Advisory Given   Pulmonary Current Smoker,  breath sounds clear to auscultation        Cardiovascular hypertension, Pt. on medications Rhythm:Regular Rate:Normal     Neuro/Psych    GI/Hepatic   Endo/Other  Morbid obesity  Renal/GU      Musculoskeletal   Abdominal   Peds  Hematology   Anesthesia Other Findings   Reproductive/Obstetrics                             Anesthesia Physical Anesthesia Plan  ASA: II  Anesthesia Plan: General   Post-op Pain Management:    Induction: Intravenous  Airway Management Planned: LMA  Additional Equipment:   Intra-op Plan:   Post-operative Plan: Extubation in OR  Informed Consent: I have reviewed the patients History and Physical, chart, labs and discussed the procedure including the risks, benefits and alternatives for the proposed anesthesia with the patient or authorized representative who has indicated his/her understanding and acceptance.   Dental advisory given  Plan Discussed with: CRNA, Anesthesiologist and Surgeon  Anesthesia Plan Comments:         Anesthesia Quick Evaluation  

## 2016-04-07 NOTE — Discharge Instructions (Signed)
How to Take a Sitz Bath A sitz bath is a warm water bath that is taken while you are sitting down. The water should only come up to your hips and should cover your buttocks. Your health care provider may recommend a sitz bath to help you:  1. Clean the lower part of your body, including your genital area. 2. With itching. 3. With pain. 4. With sore muscles or muscles that tighten or spasm. HOW TO TAKE A SITZ BATH Take 3-4 sitz baths per day or as told by your health care provider. 1. Partially fill a bathtub with warm water. You will only need the water to be deep enough to cover your hips and buttocks when you are sitting in it. 2. If your health care provider told you to put medicine in the water, follow the directions exactly. 3. Sit in the water and open the tub drain a little. 4. Turn on the warm water again to keep the tub at the correct level. Keep the water running constantly. 5. Soak in the water for 15-20 minutes or as told by your health care provider. 6. After the sitz bath, pat the affected area dry first. Do not rub it. 7. Be careful when you stand up after the sitz bath because you may feel dizzy. SEEK MEDICAL CARE IF: 1. Your symptoms get worse. Do not continue with sitz baths if your symptoms get worse. 2. You have new symptoms. Do not continue with sitz baths until you talk with your health care provider.   This information is not intended to replace advice given to you by your health care provider. Make sure you discuss any questions you have with your health care provider.   Document Released: 08/13/2004 Document Revised: 04/07/2015 Document Reviewed: 11/19/2014 Elsevier Interactive Patient Education 2016 [employer]    Use Miralax daily if needed for constipation   Post Anesthesia Home Care Instructions  Activity: Get plenty of rest for the remainder of the day. A responsible adult should stay with you for 24 hours following the procedure.  For the next 24  hours, DO NOT: -Drive a car -Advertising copywriterperate machinery -Drink alcoholic beverages -Take any medication unless instructed by your physician -Make any legal decisions or sign important papers.  Meals: Start with liquid foods such as gelatin or soup. Progress to regular foods as tolerated. Avoid greasy, spicy, heavy foods. If nausea and/or vomiting occur, drink only clear liquids until the nausea and/or vomiting subsides. Call your physician if vomiting continues.  Special Instructions/Symptoms: Your throat may feel dry or sore from the anesthesia or the breathing tube placed in your throat during surgery. If this causes discomfort, gargle with warm salt water. The discomfort should disappear within 24 hours.  If you had a scopolamine patch placed behind your ear for the management of post- operative nausea and/or vomiting:  1. The medication in the patch is effective for 72 hours, after which it should be removed.  Wrap patch in a tissue and discard in the trash. Wash hands thoroughly with soap and water. 2. You may remove the patch earlier than 72 hours if you experience unpleasant side effects which may include dry mouth, dizziness or visual disturbances. 3. Avoid touching the patch. Wash your hands with soap and water after contact with the patch.   Information for Discharge Teaching: EXPAREL (bupivacaine liposome injectable suspension)   Your surgeon gave you EXPAREL(bupivacaine) in your surgical incision to help control your pain after surgery.   EXPAREL is  a local anesthetic that provides pain relief by numbing the tissue around the surgical site.  EXPAREL is designed to release pain medication over time and can control pain for up to 72 hours.  Depending on how you respond to EXPAREL, you may require less pain medication during your recovery.  Possible side effects:  Temporary loss of sensation or ability to move in the area where bupivacaine was injected.  Nausea, vomiting,  constipation  Rarely, numbness and tingling in your mouth or lips, lightheadedness, or anxiety may occur.  Call your doctor right away if you think you may be experiencing any of these sensations, or if you have other questions regarding possible side effects.  Follow all other discharge instructions given to you by your surgeon or nurse. Eat a healthy diet and drink plenty of water or other fluids.  If you return to the hospital for any reason within 96 hours following the administration of EXPAREL, please inform your health care providers.

## 2016-04-07 NOTE — Interval H&P Note (Signed)
History and Physical Interval Note:  04/07/2016 12:33 PM  Shane Erickson  has presented today for surgery, with the diagnosis of ANAL FISSURE  The various methods of treatment have been discussed with the patient and family. After consideration of risks, benefits and other options for treatment, the patient has consented to  Procedure(s): LATERAL INTERNAL SPHINCTEROTOMY (N/A) EXAM UNDER ANESTHESIA (N/A) as a surgical intervention .  The patient's history has been reviewed, patient examined, no change in status, stable for surgery.  I have reviewed the patient's chart and labs.  Questions were answered to the patient's satisfaction.     Emberlee Sortino B

## 2016-04-07 NOTE — Anesthesia Postprocedure Evaluation (Signed)
Anesthesia Post Note  Patient: Shane Erickson  Procedure(s) Performed: Procedure(s) (LRB): LATERAL INTERNAL SPHINCTEROTOMY (N/A) EXAM UNDER ANESTHESIA (N/A)  Patient location during evaluation: PACU Anesthesia Type: General Level of consciousness: awake and alert Pain management: pain level controlled Vital Signs Assessment: post-procedure vital signs reviewed and stable Respiratory status: spontaneous breathing, nonlabored ventilation and respiratory function stable Cardiovascular status: blood pressure returned to baseline and stable Postop Assessment: no signs of nausea or vomiting Anesthetic complications: no    Last Vitals:  Filed Vitals:   04/07/16 1406 04/07/16 1455  BP:  134/90  Pulse: 63 67  Temp:  36.9 C  Resp: 15 18    Last Pain:  Filed Vitals:   04/07/16 1456  PainSc: 0-No pain                 Samael Blades A
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
## 2016-04-10 ENCOUNTER — Encounter (HOSPITAL_BASED_OUTPATIENT_CLINIC_OR_DEPARTMENT_OTHER): Payer: Self-pay | Admitting: Surgery

## 2016-04-11 NOTE — Addendum Note (Signed)
Addendum  created 04/11/16 [id number] by Lance CoonWesley Jaelyn Cloninger, CRNA   Modules edited: Charges VN

## 2018-03-06 NOTE — Progress Notes (Deleted)
Psychiatric Initial Adult Assessment   Patient Identification: Shane LandauShaun C Francis MRN:  [id number] Date of Evaluation:  03/06/2018 Referral Source: *** Chief Complaint:   Visit Diagnosis: No diagnosis found.  History of Present Illness:   Shane Erickson is a 42 y.o. year old male with a history of depression, hypertension, who is referred for    Associated Signs/Symptoms: Depression Symptoms:  {DEPRESSION SYMPTOMS:20000} (Hypo) Manic Symptoms:  {BHH MANIC SYMPTOMS:22872} Anxiety Symptoms:  {BHH ANXIETY SYMPTOMS:22873} Psychotic Symptoms:  {BHH PSYCHOTIC SYMPTOMS:22874} PTSD Symptoms: {BHH PTSD SYMPTOMS:22875}  Past Psychiatric History:  Outpatient:  Psychiatry admission:  Previous suicide attempt:  Past trials of medication:  History of violence:   Previous Psychotropic Medications: {YES/NO:21197}  Substance Abuse History in the last 12 months:  {yes no:314532}  Consequences of Substance Abuse: {BHH CONSEQUENCES OF SUBSTANCE ABUSE:22880}  Past Medical History:  Past Medical History:  Diagnosis Date  . Anal fissure 03/2016  . Hypertension    states under control with med., has been on med. x 2 yr.  . Obesity   . 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 SPHINCTEROTOMY N/A 04/07/2016   Procedure: LATERAL INTERNAL SPHINCTEROTOMY;  Surgeon: Luretha MurphyMatthew Martin, MD;  Location: [facility];  Service: General;  Laterality: N/A;  . UMBILICAL HERNIA REPAIR  2012    Family Psychiatric History: ***  Family History:  Family History  Problem Relation Age of Onset  . Kidney disease Father   . CVA Father   . Heart attack Father   . Hypertension Father   . CAD Father   . Thyroid disease Mother   . Multiple sclerosis Sister   . CAD Paternal Uncle     Social History:   Social History   Socioeconomic History  . Marital status: Legally Separated    Spouse name:  Not on file  . Number of children: Not on file  . Years of education: Not on file  . Highest education level: Not on file  Occupational History  . Occupation: truck Public librariandriver  Social Needs  . Financial resource strain: Not on file  . Food insecurity:    Worry: Not on file    Inability: Not on file  . Transportation needs:    Medical: Not on file    Non-medical: Not on file  Tobacco Use  . Smoking status: Current Every Day Smoker    Types: E-cigarettes  . Smokeless tobacco: Never Used  Substance and Sexual Activity  . Alcohol use: Yes    Comment: rarely  . Drug use: No  . Sexual activity: Never  Lifestyle  . Physical activity:    Days per week: Not on file    Minutes per session: Not on file  . Stress: Not on file  Relationships  . Social connections:    Talks on phone: Not on file    Gets together: Not on file    Attends religious service: Not on file    Active member of club or organization: Not on file    Attends meetings of clubs or organizations: Not on file    Relationship status: Not on file  Other Topics Concern  . Not on file  Social History Narrative  . Not on file    Additional Social History: ***  Allergies:   Allergies  Allergen Reactions  . Lip Balm Rash  BLISTERS  . Sunscreens Rash    BLISTERS  . Soap Itching    IRISH SPRING    Metabolic Disorder Labs: No results found for: HGBA1C, MPG No results found for: PROLACTIN No results found for: CHOL, TRIG, HDL, CHOLHDL, VLDL, LDLCALC   Current Medications: Current Outpatient Medications  Medication Sig Dispense Refill  . HYDROcodone-acetaminophen (NORCO/VICODIN) 5-325 MG tablet Take 1-2 tablets by mouth every 4 (four) hours as needed for moderate pain or severe pain. 40 tablet 0  . lisinopril-hydrochlorothiazide (PRINZIDE,ZESTORETIC) 20-12.5 MG per tablet Take 1 tablet by mouth daily.     No current facility-administered medications for this visit.     Neurologic: Headache: No Seizure:  No Paresthesias:No  Musculoskeletal: Strength & Muscle Tone: within normal limits Gait & Station: normal Patient leans: N/A  Psychiatric Specialty Exam: ROS  There were no vitals taken for this visit.There is no height or weight on file to calculate BMI.  General Appearance: Fairly Groomed  Eye Contact:  Good  Speech:  Clear and Coherent  Volume:  Normal  Mood:  {BHH MOOD:22306}  Affect:  {Affect (PAA):22687}  Thought Process:  Coherent and Goal Directed  Orientation:  Full (Time, Place, and Person)  Thought Content:  Logical  Suicidal Thoughts:  {ST/HT (PAA):22692}  Homicidal Thoughts:  {ST/HT (PAA):22692}  Memory:  Immediate;   Good Recent;   Good Remote;   Good  Judgement:  {Judgement (PAA):22694}  Insight:  {Insight (PAA):22695}  Psychomotor Activity:  Normal  Concentration:  Concentration: Good and Attention Span: Good  Recall:  Good  Fund of Knowledge:Good  Language: Good  Akathisia:  No  Handed:  Right  AIMS (if indicated):  N/A  Assets:  Communication Skills Desire for Improvement  ADL's:  Intact  Cognition: WNL  Sleep:  ***   Assessment  Plan  The patient demonstrates the following risk factors for suicide: Chronic risk factors for suicide include: {Chronic Risk Factors for ONGEXBM:84132440}. Acute risk factors for suicide include: {Acute Risk Factors for NUUVOZD:66440347}. Protective factors for this patient include: {Protective Factors for Suicide QQVZ:56387564}. Considering these factors, the overall suicide risk at this point appears to be {Desc; low/moderate/high:110033}. Patient {ACTION; IS/IS PPI:95188416} appropriate for outpatient follow up.   Treatment Plan Summary: Plan as above   Neysa Hotter, MD 4/2/201911:58 AM

## 2018-03-08 ENCOUNTER — Ambulatory Visit (HOSPITAL_COMMUNITY): Payer: Self-pay | Admitting: Psychiatry

## 2019-06-25 ENCOUNTER — Other Ambulatory Visit: Payer: Self-pay

## 2019-06-25 DIAGNOSIS — Z20822 Contact with and (suspected) exposure to covid-19: Secondary | ICD-10-CM

## 2019-06-28 LAB — NOVEL CORONAVIRUS, NAA: SARS-CoV-2, NAA: NOT DETECTED

## 2022-03-22 HISTORY — PX: MENISCUS REPAIR: SHX5179

## 2022-06-01 ENCOUNTER — Encounter: Payer: Self-pay | Admitting: Vascular Surgery

## 2022-06-01 ENCOUNTER — Ambulatory Visit: Payer: BC Managed Care – PPO | Admitting: Vascular Surgery

## 2022-06-01 VITALS — BP 138/102 | HR 71 | Temp 99.0°F | Resp 18 | Ht 70.0 in | Wt 311.0 lb

## 2022-06-01 DIAGNOSIS — I83812 Varicose veins of left lower extremities with pain: Secondary | ICD-10-CM

## 2022-06-01 NOTE — Progress Notes (Signed)
ASSESSMENT & PLAN   CHRONIC VENOUS INSUFFICIENCY: This patient presents with painful varicose veins of the left lower extremity.  He has CEAP C2 venous disease (varicose veins).  We have discussed the importance of intermittent leg elevation and the proper positioning for this.  He has thigh-high compression stockings with a gradient of 20 to 30 mmHg and I have encouraged him to continue to wear these.  We have discussed the importance of exercise specifically walking and water aerobics.  I encouraged him to avoid prolonged sitting and standing.  We discussed the importance of maintaining a healthy weight as central obesity especially increases lower extremity venous pressure.  I bring him back in 3 months for formal venous reflux study on the left.  If he has significant reflux in the left great saphenous vein that I think he would be a candidate for laser ablation of the left great saphenous vein with 10-20 stab phlebectomies.  REASON FOR CONSULT:    Painful varicose veins left leg.  The consult is requested by Dr. Eugenia Mcalpine.  HPI:   Shane Erickson is a 46 y.o. male who presents with painful varicose veins of the left lower extremity.  He has had symptoms for over a year.  He describes aching pain and heaviness in his left leg which is aggravated by standing and sitting and relieved with elevation.  He has been wearing thigh-high compression stockings with a gradient of 20 to 30 mmHg.  He denies any previous history of DVT.  He said no previous venous procedures.  Past Medical History:  Diagnosis Date   Anal fissure 03/2016   Hypertension    states under control with med., has been on med. x 2 yr.   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    Family History  Problem Relation Age of Onset   Kidney disease Father    CVA Father    Heart attack Father    Hypertension Father    CAD Father    Thyroid disease Mother     Multiple sclerosis Sister    CAD Paternal Uncle     SOCIAL HISTORY: Social History   Tobacco Use   Smoking status: Erickson Day    Types: E-cigarettes   Smokeless tobacco: Never  Substance Use Topics   Alcohol use: Yes    Comment: rarely    Allergies  Allergen Reactions   Chapstick Rash    BLISTERS   Sunscreens Rash    BLISTERS   Soap Itching    IRISH SPRING    Current Outpatient Medications  Medication Sig Dispense Refill   amoxicillin (AMOXIL) 250 MG capsule Take 250 mg by mouth 3 (three) times daily.     HYDROcodone-acetaminophen (NORCO/VICODIN) 5-325 MG tablet Take 1-2 tablets by mouth Erickson 4 (four) hours as needed for moderate pain or severe pain. 40 tablet 0   lisinopril-hydrochlorothiazide (PRINZIDE,ZESTORETIC) 20-12.5 MG per tablet Take 1 tablet by mouth daily.     No current facility-administered medications for this visit.    REVIEW OF SYSTEMS:  [X]  denotes positive finding, [ ]  denotes negative finding Cardiac  Comments:  Chest pain or chest pressure:    Shortness of breath upon exertion:    Short of breath when lying flat:    Irregular heart rhythm:        Vascular    Pain in calf, thigh, or hip brought  on by ambulation:    Pain in feet at night that wakes you up from your sleep:     Blood clot in your veins:    Leg swelling:         Pulmonary    Oxygen at home:    Productive cough:     Wheezing:         Neurologic    Sudden weakness in arms or legs:     Sudden numbness in arms or legs:     Sudden onset of difficulty speaking or slurred speech:    Temporary loss of vision in one eye:     Problems with dizziness:         Gastrointestinal    Blood in stool:     Vomited blood:         Genitourinary    Burning when urinating:     Blood in urine:        Psychiatric    Major depression:         Hematologic    Bleeding problems:    Problems with blood clotting too easily:        Skin    Rashes or ulcers:        Constitutional    Fever  or chills:    -  PHYSICAL EXAM:   Vitals:   06/01/22 1135  BP: (!) 138/102  Pulse: 71  Resp: 18  Temp: 99 F (37.2 C)  TempSrc: Temporal  SpO2: 96%  Weight: (!) 311 lb (141.1 kg)  Height: 5\' 10"  (1.778 m)   Body mass index is 44.62 kg/m.  GENERAL: The patient is a well-nourished male, in no acute distress. The vital signs are documented above. CARDIAC: There is a regular rate and rhythm.  VASCULAR: I do not detect carotid bruits. He has palpable posterior tibial pulses bilaterally. He has some dilated varicose veins in his medial left thigh and left calf.   I did look at his great saphenous vein myself with the SonoSite and he does appear to have reflux.  The vein is significantly dilated. PULMONARY: There is good air exchange bilaterally without wheezing or rales. ABDOMEN: Soft and non-tender with normal pitched bowel sounds.  MUSCULOSKELETAL: There are no major deformities. NEUROLOGIC: No focal weakness or paresthesias are detected. SKIN: There are no ulcers or rashes noted. PSYCHIATRIC: The patient has a normal affect.  DATA:    He did not have formal venous reflux testing however I did look at the great saphenous vein myself with the SonoSite and he does appear to have reflux.  The vein is dilated up to about 5 mm.  A total of 45 minutes was spent on this visit. 25 minutes was face to face time. More than 50% of the time was spent on counseling and coordinating with the patient.    [facility]

## 2022-06-09 ENCOUNTER — Other Ambulatory Visit: Payer: Self-pay

## 2022-06-09 DIAGNOSIS — I83812 Varicose veins of left lower extremities with pain: Secondary | ICD-10-CM

## 2022-07-04 ENCOUNTER — Ambulatory Visit (HOSPITAL_COMMUNITY)
Admission: RE | Admit: 2022-07-04 | Discharge: 2022-07-04 | Disposition: A | Discharge status: Home / Self Care | Payer: BC Managed Care – PPO | Source: Ambulatory Visit | Attending: Specialist | Admitting: Specialist

## 2022-07-04 ENCOUNTER — Other Ambulatory Visit (HOSPITAL_COMMUNITY): Payer: Self-pay | Admitting: Specialist

## 2022-07-04 DIAGNOSIS — M79605 Pain in left leg: Secondary | ICD-10-CM

## 2022-07-24 ENCOUNTER — Encounter (HOSPITAL_COMMUNITY): Payer: Self-pay | Admitting: *Deleted

## 2022-07-24 ENCOUNTER — Emergency Department (HOSPITAL_COMMUNITY): Payer: BC Managed Care – PPO

## 2022-07-24 ENCOUNTER — Other Ambulatory Visit: Payer: Self-pay

## 2022-07-24 ENCOUNTER — Emergency Department (HOSPITAL_COMMUNITY)
Admission: EM | Admit: 2022-07-24 | Discharge: 2022-07-25 | Disposition: A | Discharge status: Home / Self Care | Payer: BC Managed Care – PPO | Attending: Emergency Medicine | Admitting: Emergency Medicine

## 2022-07-24 DIAGNOSIS — I10 Essential (primary) hypertension: Secondary | ICD-10-CM | POA: Diagnosis not present

## 2022-07-24 DIAGNOSIS — Z79899 Other long term (current) drug therapy: Secondary | ICD-10-CM | POA: Diagnosis not present

## 2022-07-24 DIAGNOSIS — R519 Headache, unspecified: Secondary | ICD-10-CM | POA: Diagnosis present

## 2022-07-24 LAB — BASIC METABOLIC PANEL
Anion gap: 10 (ref 5–15)
BUN: 12 mg/dL (ref 6–20)
CO2: 27 mmol/L (ref 22–32)
Calcium: 9.2 mg/dL (ref 8.9–10.3)
Chloride: 99 mmol/L (ref 98–111)
Creatinine, Ser: 1.38 mg/dL — ABNORMAL HIGH (ref 0.61–1.24)
GFR, Estimated: >60 mL/min (ref >60)
Glucose, Bld: 98 mg/dL (ref 70–99)
Potassium: 3.3 mmol/L — ABNORMAL LOW (ref 3.5–5.1)
Sodium: 136 mmol/L (ref 135–145)

## 2022-07-24 LAB — CBC WITH DIFFERENTIAL/PLATELET
Abs Immature Granulocytes: 0.01 10*3/uL (ref 0.00–0.07)
Basophils Absolute: 0 10*3/uL (ref 0.0–0.1)
Basophils Relative: 1 %
Eosinophils Absolute: 0.1 10*3/uL (ref 0.0–0.5)
Eosinophils Relative: 1 %
HCT: 48.8 % (ref 39.0–52.0)
Hemoglobin: 16.2 g/dL (ref 13.0–17.0)
Immature Granulocytes: 0 %
Lymphocytes Relative: 46 %
Lymphs Abs: 2.4 10*3/uL (ref 0.7–4.0)
MCH: 28.2 pg (ref 26.0–34.0)
MCHC: 33.2 g/dL (ref 30.0–36.0)
MCV: 85 fL (ref 80.0–100.0)
Monocytes Absolute: 0.8 10*3/uL (ref 0.1–1.0)
Monocytes Relative: 15 %
Neutro Abs: 1.9 10*3/uL (ref 1.7–7.7)
Neutrophils Relative %: 37 %
Platelets: 343 10*3/uL (ref 150–400)
RBC: 5.74 MIL/uL (ref 4.22–5.81)
RDW: 14.1 % (ref 11.5–15.5)
WBC: 5.3 10*3/uL (ref 4.0–10.5)
nRBC: 0 % (ref 0.0–0.2)

## 2022-07-24 MED ORDER — HYDRALAZINE HCL 20 MG/ML IJ SOLN
10.0000 mg | Freq: Once | INTRAMUSCULAR | Status: AC
  Administered 2022-07-25: 10 mg via INTRAVENOUS
  Filled 2022-07-24: qty 1

## 2022-07-24 MED ORDER — KETOROLAC TROMETHAMINE 15 MG/ML IJ SOLN
10.0000 mg | Freq: Once | INTRAMUSCULAR | Status: AC
  Administered 2022-07-25: 10 mg via INTRAVENOUS
  Filled 2022-07-24: qty 1

## 2022-07-24 NOTE — ED Triage Notes (Signed)
Pt got BP reading of 182/120 at home.  Also c/o HA. Pt drove from [place] to here today.

## 2022-07-24 NOTE — ED Provider Notes (Signed)
[facility] EMERGENCY DEPARTMENT Provider Note   CSN: [id number] Arrival date & time: 07/24/22  2140     History {Add pertinent medical, surgical, social history, OB history to HPI:1} Chief Complaint  Patient presents with   Hypertension    Shane Erickson is a 46 y.o. male.   Hypertension  Patient presents with headache chest pain hypertension.  Began earlier today.  Came from [place].  States dull headache.  Also some chest tightness.  History of hypertension states he has been running high over the last couple weeks.  He is on medicines but states he is worried it is not working.  Also states that he is under a lot of stress due to some medical issues with his wife.  States that he tends to get headaches when blood pressure gets really high.  States it is still and diffuse over his head.  No numbness weakness.  No confusion.  No photophobia.  Began gradually.  Had some chest tightness.  States it was up when he was moving around but not exertional.     Home Medications Prior to Admission medications   Medication Sig Start Date End Date Taking? Authorizing Provider  amoxicillin (AMOXIL) 250 MG capsule Take 250 mg by mouth 3 (three) times daily.    [provider]  HYDROcodone-acetaminophen (NORCO/VICODIN) 5-325 MG tablet Take 1-2 tablets by mouth every 4 (four) hours as needed for moderate pain or severe pain. 04/07/16   Luretha Murphy, MD  lisinopril-hydrochlorothiazide (PRINZIDE,ZESTORETIC) 20-12.5 MG per tablet Take 1 tablet by mouth daily.    [provider]      Allergies    Chapstick, Sunscreens, and Soap    Review of Systems   Review of Systems  Physical Exam Updated Vital Signs BP (!) 155/113   Pulse 69   Temp 98.3 F (36.8 C) (Oral)   Resp 13   Ht 6' (1.829 m)   Wt (!) 137 kg   SpO2 98%   BMI 40.96 kg/m  Physical Exam Vitals and nursing note reviewed.  Constitutional:      Appearance: Normal appearance.  Cardiovascular:     Rate and  Rhythm: Regular rhythm.  Chest:     Chest wall: No tenderness.  Abdominal:     Tenderness: There is no abdominal tenderness.  Musculoskeletal:        General: No tenderness.     Cervical back: Neck supple.  Skin:    General: Skin is warm.     Capillary Refill: Capillary refill takes less than 2 seconds.  Neurological:     Mental Status: He is oriented to person, place, and time.     ED Results / Procedures / Treatments   Labs (all labs ordered are listed, but only abnormal results are displayed) Labs Reviewed  BASIC METABOLIC PANEL - Abnormal; Notable for the following components:      Result Value   Potassium 3.3 (*)    Creatinine, Ser 1.38 (*)    All other components within normal limits  CBC WITH DIFFERENTIAL/PLATELET  TROPONIN I (HIGH SENSITIVITY)    EKG EKG Interpretation  Date/Time:  Sunday July 24 2022 23:43:11 EDT Ventricular Rate:  73 PR Interval:  178 QRS Duration: 113 QT Interval:  394 QTC Calculation: 435 R Axis:   57 Text Interpretation: Sinus rhythm Borderline intraventricular conduction delay Low voltage, precordial leads Borderline ST elevation, lateral leads No significant change since last tracing Nonspecific inferior ST changes similar to prior. Confirmed by Benjiman Core [phone])  on 07/24/2022 11:48:08 PM  Radiology No results found.  Procedures Procedures  {Document cardiac monitor, telemetry assessment procedure when appropriate:1}  Medications Ordered in ED Medications  hydrALAZINE (APRESOLINE) injection 10 mg (has no administration in time range)  ketorolac (TORADOL) 15 MG/ML injection 10 mg (has no administration in time range)    ED Course/ Medical Decision Making/ A&P                           Medical Decision Making Amount and/or Complexity of Data Reviewed Labs: ordered. Radiology: ordered.  Risk Prescription drug management.   Patient with head tension.  History of same.  Is also under stress.  But states it has been  high over the last week with systolics more at the 150s compared to 180s today.  Dull chest pain with it.  Also dull headache.  States both these sometimes come on when his headache goes high.  Not exertional.  However will get chest x-ray EKG and blood work to evaluate.  Creatinine slightly elevated but near baseline.  EKG similar before but does have some nonspecific inferior changes.  We will give some medicine to help bring down the blood pressure more acutely in the setting of the chest pain and headache.  We will also treat a headache.  {Document critical care time when appropriate:1} {Document review of labs and clinical decision tools ie heart score, Chads2Vasc2 etc:1}  {Document your independent review of radiology images, and any outside records:1} {Document your discussion with family members, caretakers, and with consultants:1} {Document social determinants of health affecting pt's care:1} {Document your decision making why or why not admission, treatments were needed:1} Final Clinical Impression(s) / ED Diagnoses Final diagnoses:  None    Rx / DC Orders ED Discharge Orders     None

## 2022-07-25 LAB — TROPONIN I (HIGH SENSITIVITY): Troponin I (High Sensitivity): 5 ng/L (ref <18)

## 2022-07-25 NOTE — Discharge Instructions (Signed)
Call your provider tomorrow for potential adjustment of your medications.

## 2022-09-07 ENCOUNTER — Encounter (HOSPITAL_COMMUNITY): Payer: BC Managed Care – PPO

## 2022-09-07 ENCOUNTER — Ambulatory Visit: Payer: BC Managed Care – PPO | Admitting: Vascular Surgery

## 2022-10-12 ENCOUNTER — Ambulatory Visit: Payer: BC Managed Care – PPO | Admitting: Vascular Surgery

## 2022-10-12 ENCOUNTER — Ambulatory Visit (HOSPITAL_COMMUNITY): Payer: BC Managed Care – PPO | Attending: Vascular Surgery

## 2024-02-02 ENCOUNTER — Encounter (HOSPITAL_BASED_OUTPATIENT_CLINIC_OR_DEPARTMENT_OTHER): Payer: Self-pay | Admitting: Orthopedic Surgery

## 2024-02-08 ENCOUNTER — Encounter (HOSPITAL_BASED_OUTPATIENT_CLINIC_OR_DEPARTMENT_OTHER)
Admission: RE | Admit: 2024-02-08 | Discharge: 2024-02-08 | Disposition: A | Discharge status: Home / Self Care | Source: Ambulatory Visit | Attending: Orthopedic Surgery | Admitting: Orthopedic Surgery

## 2024-02-08 DIAGNOSIS — M25811 Other specified joint disorders, right shoulder: Secondary | ICD-10-CM | POA: Diagnosis not present

## 2024-02-08 DIAGNOSIS — I10 Essential (primary) hypertension: Secondary | ICD-10-CM | POA: Diagnosis not present

## 2024-02-08 DIAGNOSIS — E66813 Obesity, class 3: Secondary | ICD-10-CM | POA: Diagnosis not present

## 2024-02-08 DIAGNOSIS — Z01818 Encounter for other preprocedural examination: Secondary | ICD-10-CM | POA: Insufficient documentation

## 2024-02-08 DIAGNOSIS — Z8249 Family history of ischemic heart disease and other diseases of the circulatory system: Secondary | ICD-10-CM | POA: Diagnosis not present

## 2024-02-08 DIAGNOSIS — M19011 Primary osteoarthritis, right shoulder: Secondary | ICD-10-CM | POA: Diagnosis not present

## 2024-02-08 DIAGNOSIS — Z6841 Body Mass Index (BMI) 40.0 and over, adult: Secondary | ICD-10-CM | POA: Diagnosis not present

## 2024-02-08 DIAGNOSIS — Z87891 Personal history of nicotine dependence: Secondary | ICD-10-CM | POA: Diagnosis not present

## 2024-02-08 DIAGNOSIS — G4730 Sleep apnea, unspecified: Secondary | ICD-10-CM | POA: Diagnosis not present

## 2024-02-08 LAB — BASIC METABOLIC PANEL
Anion gap: 13 (ref 5–15)
BUN: 14 mg/dL (ref 6–20)
CO2: 28 mmol/L (ref 22–32)
Calcium: 10 mg/dL (ref 8.9–10.3)
Chloride: 98 mmol/L (ref 98–111)
Creatinine, Ser: 1.46 mg/dL — ABNORMAL HIGH (ref 0.61–1.24)
GFR, Estimated: 60 mL/min — ABNORMAL LOW (ref >60)
Glucose, Bld: 76 mg/dL (ref 70–99)
Potassium: 4.3 mmol/L (ref 3.5–5.1)
Sodium: 139 mmol/L (ref 135–145)

## 2024-02-08 NOTE — Progress Notes (Signed)

## 2024-02-09 ENCOUNTER — Encounter (HOSPITAL_BASED_OUTPATIENT_CLINIC_OR_DEPARTMENT_OTHER)
Admission: RE | Disposition: A | Discharge status: Home / Self Care | Payer: Self-pay | Source: Home / Self Care | Attending: Orthopedic Surgery

## 2024-02-09 ENCOUNTER — Ambulatory Visit (HOSPITAL_BASED_OUTPATIENT_CLINIC_OR_DEPARTMENT_OTHER): Payer: Self-pay | Admitting: Certified Registered"

## 2024-02-09 ENCOUNTER — Other Ambulatory Visit: Payer: Self-pay

## 2024-02-09 ENCOUNTER — Encounter (HOSPITAL_BASED_OUTPATIENT_CLINIC_OR_DEPARTMENT_OTHER): Payer: Self-pay | Admitting: Orthopedic Surgery

## 2024-02-09 ENCOUNTER — Ambulatory Visit (HOSPITAL_BASED_OUTPATIENT_CLINIC_OR_DEPARTMENT_OTHER)
Admission: RE | Admit: 2024-02-09 | Discharge: 2024-02-09 | Disposition: A | Discharge status: Home / Self Care | Payer: BC Managed Care – PPO | Attending: Orthopedic Surgery | Admitting: Orthopedic Surgery

## 2024-02-09 DIAGNOSIS — Z87891 Personal history of nicotine dependence: Secondary | ICD-10-CM | POA: Insufficient documentation

## 2024-02-09 DIAGNOSIS — E66813 Obesity, class 3: Secondary | ICD-10-CM | POA: Insufficient documentation

## 2024-02-09 DIAGNOSIS — Z6841 Body Mass Index (BMI) 40.0 and over, adult: Secondary | ICD-10-CM | POA: Insufficient documentation

## 2024-02-09 DIAGNOSIS — Z01818 Encounter for other preprocedural examination: Secondary | ICD-10-CM

## 2024-02-09 DIAGNOSIS — M19011 Primary osteoarthritis, right shoulder: Secondary | ICD-10-CM | POA: Diagnosis not present

## 2024-02-09 DIAGNOSIS — I10 Essential (primary) hypertension: Secondary | ICD-10-CM | POA: Insufficient documentation

## 2024-02-09 DIAGNOSIS — M25811 Other specified joint disorders, right shoulder: Secondary | ICD-10-CM | POA: Insufficient documentation

## 2024-02-09 DIAGNOSIS — G4730 Sleep apnea, unspecified: Secondary | ICD-10-CM | POA: Insufficient documentation

## 2024-02-09 DIAGNOSIS — Z8249 Family history of ischemic heart disease and other diseases of the circulatory system: Secondary | ICD-10-CM | POA: Insufficient documentation

## 2024-02-09 HISTORY — PX: SHOULDER ARTHROSCOPY WITH SUBACROMIAL DECOMPRESSION: SHX5684

## 2024-02-09 HISTORY — PX: SHOULDER ARTHROSCOPY WITH DISTAL CLAVICLE RESECTION: SHX5675

## 2024-02-09 SURGERY — SHOULDER ARTHROSCOPY WITH SUBACROMIAL DECOMPRESSION
Anesthesia: Regional | Site: Shoulder | Laterality: Right

## 2024-02-09 MED ORDER — MIDAZOLAM HCL 2 MG/2ML IJ SOLN
2.0000 mg | Freq: Once | INTRAMUSCULAR | Status: AC
  Administered 2024-02-09: 2 mg via INTRAVENOUS

## 2024-02-09 MED ORDER — DEXAMETHASONE SODIUM PHOSPHATE 10 MG/ML IJ SOLN
INTRAMUSCULAR | Status: DC | PRN
  Administered 2024-02-09: 10 mg via INTRAVENOUS

## 2024-02-09 MED ORDER — ROCURONIUM BROMIDE 100 MG/10ML IV SOLN
INTRAVENOUS | Status: DC | PRN
  Administered 2024-02-09: 70 mg via INTRAVENOUS

## 2024-02-09 MED ORDER — DEXAMETHASONE SODIUM PHOSPHATE 10 MG/ML IJ SOLN
INTRAMUSCULAR | Status: AC
  Filled 2024-02-09: qty 1

## 2024-02-09 MED ORDER — OXYCODONE HCL 5 MG PO TABS: 5.0000 mg | ORAL_TABLET | Freq: Once | ORAL | Status: DC | PRN

## 2024-02-09 MED ORDER — ROCURONIUM BROMIDE 10 MG/ML (PF) SYRINGE
PREFILLED_SYRINGE | INTRAVENOUS | Status: AC
  Filled 2024-02-09: qty 10

## 2024-02-09 MED ORDER — MIDAZOLAM HCL 2 MG/2ML IJ SOLN
INTRAMUSCULAR | Status: AC
  Filled 2024-02-09: qty 2

## 2024-02-09 MED ORDER — ACETAMINOPHEN 500 MG PO TABS
1000.0000 mg | ORAL_TABLET | Freq: Once | ORAL | Status: AC
  Administered 2024-02-09: 1000 mg via ORAL

## 2024-02-09 MED ORDER — SUGAMMADEX SODIUM 200 MG/2ML IV SOLN
INTRAVENOUS | Status: DC | PRN
  Administered 2024-02-09: 400 mg via INTRAVENOUS

## 2024-02-09 MED ORDER — OXYCODONE HCL 5 MG/5ML PO SOLN: 5.0000 mg | Freq: Once | ORAL | Status: DC | PRN

## 2024-02-09 MED ORDER — LIDOCAINE 2% (20 MG/ML) 5 ML SYRINGE
INTRAMUSCULAR | Status: AC
  Filled 2024-02-09: qty 5

## 2024-02-09 MED ORDER — SODIUM CHLORIDE 0.9 % IR SOLN
Status: DC | PRN
  Administered 2024-02-09: 3000 mL

## 2024-02-09 MED ORDER — OXYCODONE HCL 5 MG PO TABS: 5.0000 mg | ORAL_TABLET | ORAL | 0 refills | Status: DC | PRN

## 2024-02-09 MED ORDER — PROPOFOL 10 MG/ML IV BOLUS
INTRAVENOUS | Status: DC | PRN
  Administered 2024-02-09: 200 mg via INTRAVENOUS

## 2024-02-09 MED ORDER — PROPOFOL 10 MG/ML IV BOLUS
INTRAVENOUS | Status: AC
  Filled 2024-02-09: qty 20

## 2024-02-09 MED ORDER — FENTANYL CITRATE (PF) 100 MCG/2ML IJ SOLN
INTRAMUSCULAR | Status: AC
  Filled 2024-02-09: qty 2

## 2024-02-09 MED ORDER — LIDOCAINE 2% (20 MG/ML) 5 ML SYRINGE
INTRAMUSCULAR | Status: DC | PRN
  Administered 2024-02-09: 20 mg via INTRAVENOUS

## 2024-02-09 MED ORDER — PHENYLEPHRINE HCL (PRESSORS) 10 MG/ML IV SOLN
INTRAVENOUS | Status: DC | PRN
  Administered 2024-02-09 (×2): 160 ug via INTRAVENOUS
  Administered 2024-02-09: 80 ug via INTRAVENOUS

## 2024-02-09 MED ORDER — LACTATED RINGERS IV SOLN: INTRAVENOUS | Status: DC

## 2024-02-09 MED ORDER — ONDANSETRON 4 MG PO TBDP: 4.0000 mg | ORAL_TABLET | Freq: Three times a day (TID) | ORAL | 0 refills | Status: AC | PRN

## 2024-02-09 MED ORDER — ONDANSETRON HCL 4 MG/2ML IJ SOLN
INTRAMUSCULAR | Status: AC
  Filled 2024-02-09: qty 2

## 2024-02-09 MED ORDER — AMISULPRIDE (ANTIEMETIC) 5 MG/2ML IV SOLN: 10.0000 mg | Freq: Once | INTRAVENOUS | Status: DC | PRN

## 2024-02-09 MED ORDER — ONDANSETRON HCL 4 MG/2ML IJ SOLN
INTRAMUSCULAR | Status: DC | PRN
  Administered 2024-02-09: 4 mg via INTRAVENOUS

## 2024-02-09 MED ORDER — PHENYLEPHRINE 80 MCG/ML (10ML) SYRINGE FOR IV PUSH (FOR BLOOD PRESSURE SUPPORT)
PREFILLED_SYRINGE | INTRAVENOUS | Status: AC
  Filled 2024-02-09: qty 10

## 2024-02-09 MED ORDER — FENTANYL CITRATE (PF) 100 MCG/2ML IJ SOLN
100.0000 ug | Freq: Once | INTRAMUSCULAR | Status: AC
  Administered 2024-02-09: 100 ug via INTRAVENOUS

## 2024-02-09 MED ORDER — BUPIVACAINE HCL (PF) 0.5 % IJ SOLN
INTRAMUSCULAR | Status: DC | PRN
  Administered 2024-02-09: 15 mL via PERINEURAL

## 2024-02-09 MED ORDER — CEFAZOLIN SODIUM-DEXTROSE 3-4 GM/150ML-% IV SOLN
3.0000 g | INTRAVENOUS | Status: AC
  Administered 2024-02-09: 3 g via INTRAVENOUS

## 2024-02-09 MED ORDER — FENTANYL CITRATE (PF) 100 MCG/2ML IJ SOLN
25.0000 ug | INTRAMUSCULAR | Status: DC | PRN
Start: 2024-02-09 — End: 2024-02-09

## 2024-02-09 MED ORDER — CEFAZOLIN SODIUM-DEXTROSE 3-4 GM/150ML-% IV SOLN
INTRAVENOUS | Status: AC
  Filled 2024-02-09: qty 150

## 2024-02-09 MED ORDER — BUPIVACAINE LIPOSOME 1.3 % IJ SUSP
INTRAMUSCULAR | Status: DC | PRN
  Administered 2024-02-09: 10 mL via PERINEURAL

## 2024-02-09 MED ORDER — ACETAMINOPHEN 500 MG PO TABS
ORAL_TABLET | ORAL | Status: AC
  Filled 2024-02-09: qty 2

## 2024-02-09 SURGICAL SUPPLY — 47 items
BNDG COHESIVE 4X5 TAN STRL LF (GAUZE/BANDAGES/DRESSINGS) IMPLANT
BURR OVAL 8 FLU 4.0X13 (MISCELLANEOUS) IMPLANT
CANNULA 5.75X71 LONG (CANNULA) IMPLANT
CANNULA TWIST IN 8.25X7CM (CANNULA) IMPLANT
COOLER ICEMAN CLASSIC (MISCELLANEOUS) ×1 IMPLANT
CUTTER BONE 4.0MM X 13CM (MISCELLANEOUS) ×1 IMPLANT
DRAPE INCISE IOBAN 66X45 STRL (DRAPES) ×1 IMPLANT
DRAPE STERI 35X30 U-POUCH (DRAPES) ×1 IMPLANT
DRAPE SURG 17X23 STRL (DRAPES) ×1 IMPLANT
DRAPE U-SHAPE 47X51 STRL (DRAPES) ×1 IMPLANT
DRAPE U-SHAPE 76X120 STRL (DRAPES) ×2 IMPLANT
DURAPREP 26ML APPLICATOR (WOUND CARE) ×1 IMPLANT
GAUZE PAD ABD 8X10 STRL (GAUZE/BANDAGES/DRESSINGS) ×2 IMPLANT
GAUZE SPONGE 4X4 12PLY STRL (GAUZE/BANDAGES/DRESSINGS) ×1 IMPLANT
GAUZE XEROFORM 1X8 LF (GAUZE/BANDAGES/DRESSINGS) IMPLANT
GLOVE BIO SURGEON STRL SZ7.5 (GLOVE) ×2 IMPLANT
GLOVE BIOGEL PI IND STRL 8 (GLOVE) ×2 IMPLANT
GOWN STRL REUS W/ TWL LRG LVL3 (GOWN DISPOSABLE) ×1 IMPLANT
GOWN STRL REUS W/TWL XL LVL3 (GOWN DISPOSABLE) ×2 IMPLANT
LOOP 2 FIBERLINK CLOSED (SUTURE) IMPLANT
MANIFOLD NEPTUNE II (INSTRUMENTS) ×1 IMPLANT
NDL HD SCORPION MEGA LOADER (NEEDLE) IMPLANT
NDL SAFETY ECLIPSE 18X1.5 (NEEDLE) ×1 IMPLANT
PACK ARTHROSCOPY DSU (CUSTOM PROCEDURE TRAY) ×1 IMPLANT
PACK BASIN DAY SURGERY FS (CUSTOM PROCEDURE TRAY) ×1 IMPLANT
PAD COLD SHLDR WRAP-ON (PAD) ×1 IMPLANT
PAD ORTHO SHOULDER 7X19 LRG (SOFTGOODS) IMPLANT
SLEEVE ARM SUSPENSION SYSTEM (MISCELLANEOUS) ×1 IMPLANT
SLEEVE SCD COMPRESS KNEE MED (STOCKING) ×1 IMPLANT
SLING ARM FOAM STRAP LRG (SOFTGOODS) IMPLANT
SLING ARM FOAM STRAP XLG (SOFTGOODS) IMPLANT
SLING S3 LATERAL DISP (MISCELLANEOUS) IMPLANT
SPIKE FLUID TRANSFER (MISCELLANEOUS) IMPLANT
STRIP CLOSURE SKIN 1/2X4 (GAUZE/BANDAGES/DRESSINGS) ×1 IMPLANT
SUT ETHILON 3 0 PS 1 (SUTURE) IMPLANT
SUT MNCRL AB 3-0 PS2 27 (SUTURE) ×1 IMPLANT
SUT PDS AB 0 CT 36 (SUTURE) IMPLANT
SUT VIC AB 0 CT1 36 (SUTURE) IMPLANT
SUT VIC AB 2-0 CT1 TAPERPNT 27 (SUTURE) IMPLANT
SUTURE TAPE TIGERLINK 1.3MM BL (SUTURE) IMPLANT
SUTURETAPE TIGERLINK 1.3MM BL (SUTURE) IMPLANT
SYR 5ML LL (SYRINGE) ×1 IMPLANT
TAPE FIBER 2MM 7IN #2 BLUE (SUTURE) IMPLANT
TOWEL GREEN STERILE FF (TOWEL DISPOSABLE) ×1 IMPLANT
TUBE CONNECTING 20X1/4 (TUBING) ×1 IMPLANT
TUBING ARTHROSCOPY IRRIG 16FT (MISCELLANEOUS) ×1 IMPLANT
WAND ABLATOR APOLLO I90 (BUR) ×1 IMPLANT

## 2024-02-09 NOTE — Brief Op Note (Signed)
 02/09/2024  1:03 PM  PATIENT:  Shane Erickson  48 y.o. male  PRE-OPERATIVE DIAGNOSIS:  Right shoulder acromiocalvicular osteoarthritis , impingement  POST-OPERATIVE DIAGNOSIS:  Right shoulder acromiocalvicular osteoarthritis , impingement  PROCEDURE:  Procedure(s): SHOULDER ARTHROSCOPY WITH SUBACROMIAL DECOMPRESSION (Right) SHOULDER ARTHROSCOPY WITH DISTAL CLAVICLE RESECTION (Right)  SURGEON:  Surgeons and Role:    * Yolonda Kida, MD - Primary  PHYSICIAN ASSISTANT: Dion Saucier, PA-C  ANESTHESIA:   regional and general  EBL:  10 mL   BLOOD ADMINISTERED:none  DRAINS: none   LOCAL MEDICATIONS USED:  NONE  SPECIMEN:  No Specimen  DISPOSITION OF SPECIMEN:  N/A  COUNTS:  YES  TOURNIQUET:  * No tourniquets in log *  DICTATION: .Note written in EPIC  PLAN OF CARE: Discharge to home after PACU  PATIENT DISPOSITION:  PACU - hemodynamically stable.   Delay start of Pharmacological VTE agent (>24hrs) due to surgical blood loss or risk of bleeding: not applicable

## 2024-02-09 NOTE — Op Note (Signed)
 Date of service : 02/09/2024  PREOPERATIVE DIAGNOSIS:  1.  Right shoulder subacromial impingement 2.  Right shoulder acromioclavicular joint arthritis   POSTOPERATIVE DIAGNOSIS:  1.  Right shoulder subacromial impingement 2.  Right shoulder acromioclavicular joint arthritis 3.  Right shoulder degenerative anterior labral tearing   PROCEDURE:  1.  Right shoulder arthroscopic extensive debridement of anterior labrum, subacromial bursa, rotator interval 2.  Right shoulder arthroscopic subacromial decompression with coracoacromial ligament release 3.  Right shoulder arthroscopic distal clavicle resection, 1 cm of resected distal clavicle   SURGEON: Maryan Rued, M.D.   ASSIST: Dion Saucier, PA-C.  Assistant attestation:  PA Mcclung present for the entire procedure.   ANESTHESIA:  general, interscalene block with Exparel   IV FLUIDS AND URINE: See anesthesia.   ESTIMATED BLOOD LOSS: 10 mL.   IMPLANTS: None   DRAINS: None   COMPLICATIONS: None.   DESCRIPTION OF PROCEDURE: The patient was brought to the operating room and placed supine on the operating table.  The patient had been signed prior to the procedure and this was documented. The patient had the anesthesia placed by the anesthesiologist.  A time-out was performed to confirm that this was the correct patient, site, side and location. The patient did receive antibiotics prior to the incision and was re-dosed during the procedure as needed at indicated intervals.  A tourniquet was not placed.  The patient had the operative extremity prepped and draped in the standard surgical fashion.        After obtaining informed consent the patient was brought to the operating table and underwent satisfactory anesthesia. An exam under anesthesia revealed full range of motion. He was placed in the left lateral decubitus position with an axillary roll and all bony prominences properly padded. A standard surgical timeout was performed. He was  placed in 10 pounds of gentle in-line suspension.   Standard posterior and anterior superior portals were established. A diagnostic evaluation of the glenohumeral joint was performed.  Which did demonstrate no cartilaginous abnormalities with no arthritis.  He did have degenerative tearing of the anterior labrum and some superior type I SLAP pathology.  Rotator cuff tendons were intact in the infraspinatus and teres minor.  There was some undersurface articular sided tearing of the supraspinatus which was less than 10%.  Subscapularis was intact.  The long head of biceps tendon was absent from the joint consistent with spontaneous rupture.  There was some degenerative tearing of the anterior and superior labrum.  While in the joint we did our extensive debridement.  The motorized shaver was introduced through the mid glenoid portal and we extensively debrided the anterior labrum, superior labrum as well as rotator interval.  We also gently debrided partial tears of the supraspinatus.  When we introduced into the subacromial space a wide bursectomy was performed with motorized shaver as well.   The arthroscope was inserted in the subacromial space and an additional lateral portal was established. An acromioplasty performed nicely decompressing the subacromial space with a motorized burr. Bursitis in the subacromial space was removed.   Next removed our arthroscope to the lateral portal and worked from the posterior portal to complete our subacromial decompression with a cutting block fashion.  This created a nice type I acromion.   We then turned our attention to the distal clavicle resection.  While viewing from the lateral portal and working from the mid glenoid/anterior portal we induced the radiofrequency wand to remove synovium from the [facility] joint.  We identified  a sclerotic and cystic distal clavicle.  The motorized bur was then placed in the anterior portal and we resected 1 cm distal clavicle.   The  arthroscope was then removed and portals closed with 4-0 Monocryl in standard fashion followed by a sterile occlusive dressing Polar Care ice sleeve and a slingshot sling. The patient was sent to recovery in stable condition and tolerated the procedure well   POSTOPERATIVE PLAN:  Leonidus will be in his sling until his nerve block resolves.  He can then discontinue the sling and begin all activities as tolerated.  Will plan on having him discharged home today from PACU.  See him back in the office in 2 weeks.

## 2024-02-09 NOTE — Anesthesia Procedure Notes (Signed)
 Anesthesia Regional Block: Interscalene brachial plexus block   Pre-Anesthetic Checklist: , timeout performed,  Correct Patient, Correct Site, Correct Laterality,  Correct Procedure, Correct Position, site marked,  Risks and benefits discussed,  Pre-op evaluation,  At surgeon's request and post-op pain management  Laterality: Right  Prep: Maximum Sterile Barrier Precautions used, chloraprep       Needles:  Injection technique: Single-shot  Needle Type: Echogenic Stimulator Needle     Needle Length: 5cm  Needle Gauge: 21     Additional Needles:   Procedures:,,,, ultrasound used (permanent image in chart),,    Narrative:  Start time: 02/09/2024 11:07 AM End time: 02/09/2024 11:10 AM Injection made incrementally with aspirations every 5 mL. Anesthesiologist: Elmer Picker, MD

## 2024-02-09 NOTE — Discharge Instructions (Addendum)
 Orthopedic surgery discharge instructions:  -Maintain postoperative bandage for 3 days.  You may then leave the Steri-Strips in place.  you may begin showering on postoperative day #3.  Do not submerge underwater.  Maintain that bandage until your follow-up appointment in 2 weeks.  -No lifting over 2 pounds with operateive arm.  You may use the arm immediately for activities of daily living such as bathing, washing your face and brushing your teeth, eating, and getting dressed.  Otherwise maintain your sling when you are out of the house and sleeping.  -Apply ice liberally to the shoulder throughout the day.  For mild to moderate pain use Tylenol and Advil as needed around-the-clock.  For breakthrough pain use oxycodone as necessary.  -You will return to see Dr. Aundria Rud in the office in 2 weeks for routine postoperative check with x-rays.  No Tylenol before 4:15pm today.   Post Anesthesia Home Care Instructions  Activity: Get plenty of rest for the remainder of the day. A responsible individual must stay with you for 24 hours following the procedure.  For the next 24 hours, DO NOT: -Drive a car -Advertising copywriter -Drink alcoholic beverages -Take any medication unless instructed by your physician -Make any legal decisions or sign important papers.  Meals: Start with liquid foods such as gelatin or soup. Progress to regular foods as tolerated. Avoid greasy, spicy, heavy foods. If nausea and/or vomiting occur, drink only clear liquids until the nausea and/or vomiting subsides. Call your physician if vomiting continues.  Special Instructions/Symptoms: Your throat may feel dry or sore from the anesthesia or the breathing tube placed in your throat during surgery. If this causes discomfort, gargle with warm salt water. The discomfort should disappear within 24 hours.  If you had a scopolamine patch placed behind your ear for the management of post- operative nausea and/or vomiting:  1. The  medication in the patch is effective for 72 hours, after which it should be removed.  Wrap patch in a tissue and discard in the trash. Wash hands thoroughly with soap and water. 2. You may remove the patch earlier than 72 hours if you experience unpleasant side effects which may include dry mouth, dizziness or visual disturbances. 3. Avoid touching the patch. Wash your hands with soap and water after contact with the patch.   Regional Anesthesia Blocks  1. You may not be able to move or feel the "blocked" extremity after a regional anesthetic block. This may last may last from 3-48 hours after placement, but it will go away. The length of time depends on the medication injected and your individual response to the medication. As the nerves start to wake up, you may experience tingling as the movement and feeling returns to your extremity. If the numbness and inability to move your extremity has not gone away after 48 hours, please call your surgeon.   2. The extremity that is blocked will need to be protected until the numbness is gone and the strength has returned. Because you cannot feel it, you will need to take extra care to avoid injury. Because it may be weak, you may have difficulty moving it or using it. You may not know what position it is in without looking at it while the block is in effect.  3. For blocks in the legs and feet, returning to weight bearing and walking needs to be done carefully. You will need to wait until the numbness is entirely gone and the strength has returned. You should  be able to move your leg and foot normally before you try and bear weight or walk. You will need someone to be with you when you first try to ensure you do not fall and possibly risk injury.  4. Bruising and tenderness at the needle site are common side effects and will resolve in a few days.  5. Persistent numbness or new problems with movement should be communicated to the surgeon or the [facility] [phone] [facility] [phone]).Information for Discharge Teaching: EXPAREL (bupivacaine liposome injectable suspension)   Pain relief is important to your recovery. The goal is to control your pain so you can move easier and return to your normal activities as soon as possible after your procedure. Your physician may use several types of medicines to manage pain, swelling, and more.  Your surgeon or anesthesiologist gave you EXPAREL(bupivacaine) to help control your pain after surgery.  EXPAREL is a local anesthetic designed to release slowly over an extended period of time to provide pain relief by numbing the tissue around the surgical site. EXPAREL is designed to release pain medication over time and can control pain for up to 72 hours. Depending on how you respond to EXPAREL, you may require less pain medication during your recovery. EXPAREL can help reduce or eliminate the need for opioids during the first few days after surgery when pain relief is needed the most. EXPAREL is not an opioid and is not addictive. It does not cause sleepiness or sedation.   Important! A teal colored band has been placed on your arm with the date, time and amount of EXPAREL you have received. Please leave this armband in place for the full 96 hours following administration, and then you may remove the band. If you return to the hospital for any reason within 96 hours following the administration of EXPAREL, the armband provides important information that your health care providers to know, and alerts them that you have received this anesthetic.    Possible side effects of EXPAREL: Temporary loss of sensation or ability to move in the area where medication was injected. Nausea, vomiting, constipation Rarely, numbness and tingling in your mouth or lips, lightheadedness, or anxiety may occur. Call your doctor right away if you think you may be experiencing any of these  sensations, or if you have other questions regarding possible side effects.  Follow all other discharge instructions given to you by your surgeon or nurse. Eat a healthy diet and drink plenty of water or other fluids.

## 2024-02-09 NOTE — Anesthesia Preprocedure Evaluation (Addendum)
 Anesthesia Evaluation  Patient identified by MRN, date of birth, ID band Patient awake    Reviewed: Allergy & Precautions, NPO status , Patient's Chart, lab work & pertinent test results  Airway Mallampati: II  TM Distance: >3 FB Neck ROM: Full    Dental  (+) Chipped, Dental Advisory Given,    Pulmonary sleep apnea and Continuous Positive Airway Pressure Ventilation , former smoker   Pulmonary exam normal breath sounds clear to auscultation       Cardiovascular hypertension, Pt. on medications Normal cardiovascular exam Rhythm:Regular Rate:Normal     Neuro/Psych negative neurological ROS  negative psych ROS   GI/Hepatic negative GI ROS, Neg liver ROS,,,  Endo/Other    Class 3 obesity (BMI 42)  Renal/GU negative Renal ROS  negative genitourinary   Musculoskeletal negative musculoskeletal ROS (+)    Abdominal   Peds  Hematology negative hematology ROS (+)   Anesthesia Other Findings   Reproductive/Obstetrics                             Anesthesia Physical Anesthesia Plan  ASA: 3  Anesthesia Plan: General and Regional   Post-op Pain Management: Regional block* and Tylenol PO (pre-op)*   Induction: Intravenous  PONV Risk Score and Plan: Midazolam, Dexamethasone and Ondansetron  Airway Management Planned: Oral ETT  Additional Equipment:   Intra-op Plan:   Post-operative Plan: Extubation in OR  Informed Consent: I have reviewed the patients History and Physical, chart, labs and discussed the procedure including the risks, benefits and alternatives for the proposed anesthesia with the patient or authorized representative who has indicated his/her understanding and acceptance.     Dental advisory given  Plan Discussed with: CRNA  Anesthesia Plan Comments:        Anesthesia Quick Evaluation

## 2024-02-09 NOTE — H&P (Signed)
 ORTHOPAEDIC H and P  REQUESTING PHYSICIAN: Yolonda Kida, MD  PCP:  Diamantina Providence, FNP  Chief Complaint: Right shoulder pain  HPI: Shane Erickson is a 48 y.o. male who complains of longstanding right shoulder pain that has been recalcitrant to conservative treatment.Marland Kitchen  Here today for arthroscopic treatment.  Past Medical History:  Diagnosis Date   Anal fissure 03/05/2016   Hypertension    states under control with med., has been on med. x 2 yr.   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   Past Surgical History:  Procedure Laterality Date   MENISCUS REPAIR Left 03/22/2022   SPHINCTEROTOMY N/A 04/07/2016   Procedure: LATERAL INTERNAL SPHINCTEROTOMY;  Surgeon: Luretha Murphy, MD;  Location: [facility];  Service: General;  Laterality: N/A;   UMBILICAL HERNIA REPAIR  12/05/2010   Social History   Socioeconomic History   Marital status: Married    Spouse name: Not on file   Number of children: Not on file   Years of education: Not on file   Highest education level: Not on file  Occupational History   Occupation: truck driver  Tobacco Use   Smoking status: Former    Types: E-cigarettes   Smokeless tobacco: Never  Substance and Sexual Activity   Alcohol use: Yes    Comment: rarely   Drug use: No   Sexual activity: Never  Other Topics Concern   Not on file  Social History Narrative   Not on file   Social Drivers of Health   Financial Resource Strain: Not on file  Food Insecurity: Not on file  Transportation Needs: Not on file  Physical Activity: Not on file  Stress: Not on file  Social Connections: Not on file   Family History  Problem Relation Age of Onset   Kidney disease Father    CVA Father    Heart attack Father    Hypertension Father    CAD Father    Thyroid disease Mother    Multiple sclerosis Sister    CAD Paternal Uncle    Allergies  Allergen Reactions    Chapstick Rash    BLISTERS   Sunscreens Rash    BLISTERS   Soap Itching    IRISH SPRING   Prior to Admission medications   Medication Sig Start Date End Date Taking? Authorizing Provider  losartan-hydrochlorothiazide (HYZAAR) 50-12.5 MG tablet Take 1 tablet by mouth daily.   Yes [provider]   No results found.  Positive ROS: All other systems have been reviewed and were otherwise negative with the exception of those mentioned in the HPI and as above.  Physical Exam: General: Alert, no acute distress Cardiovascular: No pedal edema Respiratory: No cyanosis, no use of accessory musculature GI: No organomegaly, abdomen is soft and non-tender Skin: No lesions in the area of chief complaint Neurologic: Sensation intact distally Psychiatric: Patient is competent for consent with normal mood and affect Lymphatic: No axillary or cervical lymphadenopathy  MUSCULOSKELETAL: Right upper extremity is warm and well-perfused with no open wounds or lesions.  Assessment: 1.  Right shoulder subacromial impingement  2.  Right shoulder acromioclavicular arthritis  Plan: Plan is to proceed today with arthroscopic surgery on the right shoulder.  We discussed arthroscopy with debridement and subacromial decompression and distal clavicle resection.  We also discussed diagnostics.  We reviewed and discussed again, the risk and benefits  of the procedure.  These include but are not limited to bleeding, infection, damage to surrounding nerves and vessels, stiffness, failure for pain relief, need for revision surgery or future surgery, as well as the risk of anesthesia.  He has provided informed consent.  Discharge home postop from PACU.    Yolonda Kida, MD Cell [phone]    02/09/2024 9:59 AM

## 2024-02-09 NOTE — Anesthesia Procedure Notes (Signed)
 Procedure Name: Intubation Date/Time: 02/09/2024 12:11 PM  Performed by: Lauralyn Primes, CRNAPre-anesthesia Checklist: Patient identified, Emergency Drugs available, Suction available and Patient being monitored Patient Re-evaluated:Patient Re-evaluated prior to induction Oxygen Delivery Method: Circle system utilized Preoxygenation: Pre-oxygenation with 100% oxygen Induction Type: IV induction Ventilation: Mask ventilation without difficulty and Oral airway inserted - appropriate to patient size Laryngoscope Size: Mac and 4 Grade View: Grade II Tube type: Oral Tube size: 7.5 mm Number of attempts: 1 Airway Equipment and Method: Stylet, Oral airway and Bite block Placement Confirmation: ETT inserted through vocal cords under direct vision, positive ETCO2 and breath sounds checked- equal and bilateral Secured at: 24 cm Tube secured with: Tape Dental Injury: Teeth and Oropharynx as per pre-operative assessment

## 2024-02-09 NOTE — Anesthesia Postprocedure Evaluation (Signed)
 Anesthesia Post Note  Patient: Shane Erickson  Procedure(s) Performed: SHOULDER ARTHROSCOPY WITH SUBACROMIAL DECOMPRESSION (Right: Shoulder) SHOULDER ARTHROSCOPY WITH DISTAL CLAVICLE RESECTION (Right: Shoulder)     Patient location during evaluation: PACU Anesthesia Type: Regional and General Level of consciousness: awake and alert Pain management: pain level controlled Vital Signs Assessment: post-procedure vital signs reviewed and stable Respiratory status: spontaneous breathing, nonlabored ventilation, respiratory function stable and patient connected to nasal cannula oxygen Cardiovascular status: blood pressure returned to baseline and stable Postop Assessment: no apparent nausea or vomiting Anesthetic complications: no  No notable events documented.  Last Vitals:  Vitals:   02/09/24 1330 02/09/24 1352  BP: (!) 146/94 (!) 138/97  Pulse: 79 78  Resp: 15 14  Temp:  (!) 36.1 C  SpO2: 95% 94%    Last Pain:  Vitals:   02/09/24 1352  TempSrc: Temporal  PainSc: 0-No pain                 Eliodoro Gullett L Katriana Dortch

## 2024-02-09 NOTE — Transfer of Care (Signed)
 Immediate Anesthesia Transfer of Care Note  Patient: Shane Erickson  Procedure(s) Performed: SHOULDER ARTHROSCOPY WITH SUBACROMIAL DECOMPRESSION (Right: Shoulder) SHOULDER ARTHROSCOPY WITH DISTAL CLAVICLE RESECTION (Right: Shoulder)  Patient Location: PACU  Anesthesia Type:GA combined with regional for post-op pain  Level of Consciousness: drowsy  Airway & Oxygen Therapy: Patient Spontanous Breathing and Patient connected to face mask oxygen  Post-op Assessment: Report given to RN and Post -op Vital signs reviewed and stable  Post vital signs: Reviewed and stable  Last Vitals:  Vitals Value Taken Time  BP 150/105 Dr. Armond Hang aware 02/09/24 1303  Temp    Pulse 82 02/09/24 1305  Resp 12 02/09/24 1305  SpO2 100 % 02/09/24 1305  Vitals shown include unfiled device data.  Last Pain:  Vitals:   02/09/24 1011  TempSrc: Temporal  PainSc: 0-No pain      Patients Stated Pain Goal: 3 (02/09/24 1011)  Complications: No notable events documented.

## 2024-02-10 ENCOUNTER — Encounter (HOSPITAL_BASED_OUTPATIENT_CLINIC_OR_DEPARTMENT_OTHER): Payer: Self-pay | Admitting: Orthopedic Surgery

## 2024-02-27 ENCOUNTER — Encounter (INDEPENDENT_AMBULATORY_CARE_PROVIDER_SITE_OTHER): Payer: Self-pay | Admitting: Internal Medicine

## 2024-02-27 ENCOUNTER — Ambulatory Visit (INDEPENDENT_AMBULATORY_CARE_PROVIDER_SITE_OTHER): Payer: Self-pay | Admitting: Internal Medicine

## 2024-02-27 VITALS — BP 131/92 | HR 67 | Temp 98.7°F | Ht 70.0 in | Wt 310.0 lb

## 2024-02-27 DIAGNOSIS — I10 Essential (primary) hypertension: Secondary | ICD-10-CM | POA: Diagnosis not present

## 2024-02-27 DIAGNOSIS — G4733 Obstructive sleep apnea (adult) (pediatric): Secondary | ICD-10-CM | POA: Insufficient documentation

## 2024-02-27 DIAGNOSIS — R944 Abnormal results of kidney function studies: Secondary | ICD-10-CM | POA: Insufficient documentation

## 2024-02-27 DIAGNOSIS — Z0289 Encounter for other administrative examinations: Secondary | ICD-10-CM

## 2024-02-27 DIAGNOSIS — K7581 Nonalcoholic steatohepatitis (NASH): Secondary | ICD-10-CM | POA: Insufficient documentation

## 2024-02-27 DIAGNOSIS — R7303 Prediabetes: Secondary | ICD-10-CM | POA: Insufficient documentation

## 2024-02-27 DIAGNOSIS — E66813 Obesity, class 3: Secondary | ICD-10-CM

## 2024-02-27 DIAGNOSIS — Z6841 Body Mass Index (BMI) 40.0 and over, adult: Secondary | ICD-10-CM

## 2024-02-27 NOTE — Assessment & Plan Note (Signed)
 Currently on amlodipine 10 mg once a day blood pressure slightly above target of less than 130/80.  Losing 10% of body weight may improve blood pressure control.  I reviewed renal parameters from labs in November which showed a decreased GFR he is not aware of having chronic kidney disease but upon review of medical records he says several GFR is in the low 60s and will 1 recently at 57.  His blood pressure medications have been changed he is no longer on ARB hydrochlorothiazide so anticipate some improvement.  Patient advised to maintain adequate hydration and avoid nephrotoxins.

## 2024-02-27 NOTE — Assessment & Plan Note (Signed)
 We reviewed anthropometrics, biometrics, associated medical conditions and contributing factors with patient. he would benefit from a medically tailored reduced calorie nutrional plan based on his REE (resting energy expenditure), which will be determined by indirect calorimetry.  We will also assess for cardiometabolic risk and nutritional derangements via fasting labs at intake appointment.

## 2024-02-27 NOTE — Assessment & Plan Note (Signed)
 I reviewed labs in Care Everywhere from November 2024 he had an AST of 33 and ALT of 67 alkaline phosphatase was 57 and albumin was 3.9.  He had a CT scan in 2017 of the abdomen that showed diffuse hepatic fatty infiltration.  Patient would benefit from losing 15% of body weight also reducing saturated fats and simple sugars in diet.  He may also be a candidate for GLP-1 therapy.  We will do further risk stratification at intake visit

## 2024-02-27 NOTE — Assessment & Plan Note (Signed)
 According to patient he has severe OSA he is currently on CPAP therapy.  Losing 15% of body weight may reduce AHI he may also benefit from treatment with GLP-1.

## 2024-02-27 NOTE — Assessment & Plan Note (Signed)
 Most recent A1c is 5.8 in November 2024  Patient aware of disease state and risk of progression. This may contribute to abnormal cravings, fatigue and diabetic complications without having diabetes.   We have discussed treatment options which include: losing 7 to 10% of body weight, increasing physical activity to a goal of 150 minutes a week at moderate intensity.  Advised to maintain a diet low on simple and processed carbohydrates.  He may also be a candidate for pharmacoprophylaxis with metformin or incretin mimetic.

## 2024-02-27 NOTE — Progress Notes (Signed)
 Office: [phone]  /  Fax: [phone]   Initial Visit  Shane Erickson was seen in clinic today to evaluate for obesity. He is interested in losing weight to improve overall health and reduce the risk of weight related complications. He presents today to review program treatment options, initial physical assessment, and evaluation.     He was referred by: Specialist back surgeon  When asked what else they would like to accomplish? He states: Improve existing medical conditions and Improve quality of life  When asked how has your weight affected you? 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, had work related injury last year and required back surgery   Some associated conditions: Hypertension, OSA, and Prediabetes  Contributing factors: Disruption of circadian rhythm / sleep disordered breathing, Consumption of processed foods, Moderate to high levels of stress, Reduced physical activity, and Strong orexigenic signaling and inadequate inhibitory control   Weight promoting medications identified: None  Current nutrition plan: None  Current level of physical activity: None  Current or previous pharmacotherapy: Other: Ozempic for 6 months and did not lose   Response to medication: Ineffective so it was discontinued   Past medical history includes:   Past Medical History:  Diagnosis Date   Anal fissure 03/05/2016   Hypertension    states under control with med., has been on med. x 2 yr.   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  Peak Weight:327 , Body Fat%:39.1, Visceral Fat Rating:39, Weight trend over the last 12 months: Increasing  General:  Alert, oriented and cooperative. Patient is in no acute distress.  Respiratory: Normal respiratory effort, no problems with respiration noted   Gait: able to ambulate independently  Mental Status: Normal mood and affect. Normal behavior. Normal judgment and thought content.   DIAGNOSTIC DATA REVIEWED:  BMET    Component Value Date/Time   NA 139 02/08/2024 1300   K 4.3 02/08/2024 1300   CL 98 02/08/2024 1300   CO2 28 02/08/2024 1300   GLUCOSE 76 02/08/2024 1300   BUN 14 02/08/2024 1300   CREATININE 1.46 (H) 02/08/2024 1300   CALCIUM 10.0 02/08/2024 1300   GFRNONAA 60 (L) 02/08/2024 1300   GFRAA >60 04/04/2016 1325   No results found for: "HGBA1C" No results found for: "INSULIN" CBC    Component Value Date/Time   WBC 5.3 07/24/2022 2216   RBC 5.74 07/24/2022 2216   HGB 16.2 07/24/2022 2216   HCT 48.8 07/24/2022 2216   PLT 343 07/24/2022 2216   MCV 85.0 07/24/2022 2216   MCH 28.2 07/24/2022 2216   MCHC 33.2 07/24/2022 2216   RDW 14.1 07/24/2022 2216   Iron/TIBC/Ferritin/ %Sat No results found for: "IRON", "TIBC", "FERRITIN", "IRONPCTSAT" Lipid Panel  No results found for: "CHOL", "TRIG", "HDL", "CHOLHDL", "VLDL", "LDLCALC", "LDLDIRECT" Hepatic Function Panel  No results found for: "PROT", "ALBUMIN", "AST", "ALT", "ALKPHOS", "BILITOT", "BILIDIR", "IBILI" No results found  for: "TSH"   Assessment and Plan:   Metabolic dysfunction-associated steatohepatitis (MASH) Assessment & Plan: I reviewed labs in Care Everywhere from November 2024 he had an AST of 33 and ALT of 67 alkaline phosphatase was 57 and albumin was 3.9.  He had a CT scan in 2017 of the abdomen that showed diffuse hepatic fatty infiltration.  Patient would benefit from losing 15% of body weight also reducing saturated fats and simple sugars in diet.  He may also be a candidate  for GLP-1 therapy.  We will do further risk stratification at intake visit   Prediabetes Assessment & Plan: Most recent A1c is 5.8 in November 2024  Patient aware of disease state and risk of progression. This may contribute to abnormal cravings, fatigue and diabetic complications without having diabetes.   We have discussed treatment options which include: losing 7 to 10% of body weight, increasing physical activity to a goal of 150 minutes a week at moderate intensity.  Advised to maintain a diet low on simple and processed carbohydrates.  He may also be a candidate for pharmacoprophylaxis with metformin or incretin mimetic.     OSA (obstructive sleep apnea) - possibly severe, on CPAP Assessment & Plan: According to patient he has severe OSA he is currently on CPAP therapy.  Losing 15% of body weight may reduce AHI he may also benefit from treatment with GLP-1.   Primary hypertension Assessment & Plan: Currently on amlodipine 10 mg once a day blood pressure slightly above target of less than 130/80.  Losing 10% of body weight may improve blood pressure control.  I reviewed renal parameters from labs in November which showed a decreased GFR he is not aware of having chronic kidney disease but upon review of medical records he says several GFR is in the low 60s and will 1 recently at 57.  His blood pressure medications have been changed he is no longer on ARB hydrochlorothiazide so anticipate some improvement.  Patient advised to maintain adequate hydration and avoid nephrotoxins.   Class 3 severe obesity with serious comorbidity and body mass index (BMI) of 40.0 to 44.9 in adult, unspecified obesity type [facility]) Assessment & Plan: We reviewed anthropometrics, biometrics, associated medical conditions and contributing factors with patient. he would benefit from a medically tailored reduced calorie nutrional plan based on his REE (resting energy expenditure), which will be determined by  indirect calorimetry.  We will also assess for cardiometabolic risk and nutritional derangements via fasting labs at intake appointment.     Decreased GFR Assessment & Plan: Currently on amlodipine 10 mg once a day blood pressure slightly above target of less than 130/80.  Losing 10% of body weight may improve blood pressure control.  I reviewed renal parameters from labs in November which showed a decreased GFR he is not aware of having chronic kidney disease but upon review of medical records he says several GFR is in the low 60s and will 1 recently at 57.  His blood pressure medications have been changed he is no longer on ARB hydrochlorothiazide so anticipate some improvement.  Patient advised to maintain adequate hydration and avoid nephrotoxins.         Obesity Treatment / Action Plan:  Patient will work on garnering support from family and friends to begin weight loss journey. Will work on eliminating or reducing the presence of highly palatable, calorie dense foods in the home. Will complete provided nutritional and psychosocial assessment questionnaire before the next appointment. Will be scheduled  for indirect calorimetry to determine resting energy expenditure in a fasting state.  This will allow [place] to create a reduced calorie, high-protein meal plan to promote loss of fat mass while preserving muscle mass. Counseled on the health benefits of losing 5%-15% of total body weight. Was counseled on nutritional approaches to weight loss and benefits of reducing processed foods and consuming plant-based foods and high quality protein as part of nutritional weight management. Was counseled on pharmacotherapy and role as an adjunct in weight management.   Obesity Education Performed Today:  He was weighed on the bioimpedance scale and results were discussed and documented in the synopsis.  We discussed obesity as a disease and the importance of a more detailed evaluation of all the  factors contributing to the disease.  We discussed the importance of long term lifestyle changes which include nutrition, exercise and behavioral modifications as well as the importance of customizing this to his specific health and social needs.  We discussed the benefits of reaching a healthier weight to alleviate the symptoms of existing conditions and reduce the risks of the biomechanical, metabolic and psychological effects of obesity.  Shane Erickson appears to be in the action stage of change and states they are ready to start intensive lifestyle modifications and behavioral modifications.  I have spent 40 minutes in the care of the patient today including: preparing to see patient (e.g. review and interpretation of tests, old notes ), obtaining and/or reviewing separately obtained history, performing a medically appropriate examination or evaluation, documenting clinical information in the electronic or other health care record, and independently interpreting results and communicating results to the patient, family, or caregiver   Reviewed by clinician on day of visit: allergies, medications, problem list, medical history, surgical history, family history, social history, and previous encounter notes pertinent to obesity diagnosis.   Worthy Rancher, MD

## 2024-03-25 ENCOUNTER — Ambulatory Visit (INDEPENDENT_AMBULATORY_CARE_PROVIDER_SITE_OTHER): Admitting: Family Medicine

## 2024-04-08 ENCOUNTER — Ambulatory Visit (INDEPENDENT_AMBULATORY_CARE_PROVIDER_SITE_OTHER): Admitting: Family Medicine

## 2024-04-09 ENCOUNTER — Ambulatory Visit (HOSPITAL_BASED_OUTPATIENT_CLINIC_OR_DEPARTMENT_OTHER): Payer: Worker's Compensation | Attending: Orthopedic Surgery | Admitting: Physical Therapy

## 2024-04-09 DIAGNOSIS — R2689 Other abnormalities of gait and mobility: Secondary | ICD-10-CM | POA: Insufficient documentation

## 2024-04-09 DIAGNOSIS — M5459 Other low back pain: Secondary | ICD-10-CM | POA: Insufficient documentation

## 2024-04-09 DIAGNOSIS — M6281 Muscle weakness (generalized): Secondary | ICD-10-CM | POA: Insufficient documentation

## 2024-04-09 NOTE — Therapy (Deleted)
 OUTPATIENT PHYSICAL THERAPY THORACOLUMBAR EVALUATION   Patient Name: Shane Erickson MRN: [id number] DOB:1976-04-17, 48 y.o., male Today's Date: 04/09/2024  END OF SESSION:   Past Medical History:  Diagnosis Date   Anal fissure 03/05/2016   Hypertension    states under control with med., has been on med. x 2 yr.   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   Past Surgical History:  Procedure Laterality Date   MENISCUS REPAIR Left 03/22/2022   SHOULDER ARTHROSCOPY WITH DISTAL CLAVICLE RESECTION Right 02/09/2024   Procedure: SHOULDER ARTHROSCOPY WITH DISTAL CLAVICLE RESECTION;  Surgeon: Janeth Medicus, MD;  Location: [facility];  Service: Orthopedics;  Laterality: Right;   SHOULDER ARTHROSCOPY WITH SUBACROMIAL DECOMPRESSION Right 02/09/2024   Procedure: SHOULDER ARTHROSCOPY WITH SUBACROMIAL DECOMPRESSION;  Surgeon: Janeth Medicus, MD;  Location: [facility];  Service: Orthopedics;  Laterality: Right;   SPHINCTEROTOMY N/A 04/07/2016   Procedure: LATERAL INTERNAL SPHINCTEROTOMY;  Surgeon: Jacolyn Matar, MD;  Location: [facility];  Service: General;  Laterality: N/A;   UMBILICAL HERNIA REPAIR  12/05/2010   Patient Active Problem List   Diagnosis Date Noted   Metabolic dysfunction-associated steatohepatitis (MASH) 02/27/2024   Prediabetes 02/27/2024   OSA (obstructive sleep apnea) - possibly severe, on CPAP 02/27/2024   Primary hypertension 02/27/2024   Class 3 severe obesity with serious comorbidity and body mass index (BMI) of 40.0 to 44.9 in adult 02/27/2024   Decreased GFR 02/27/2024   Hx of rectal sphincterotomy May 2017 04/07/2016   Paresthesia 04/29/2014    PCP: Aaron Aas  REFERRING PROVIDER:   Mort Ards, MD    REFERRING DIAG: M54.59 (ICD-10-CM) - Other low back pain   Rationale for Evaluation and Treatment: Rehabilitation  THERAPY DIAG:  No diagnosis  found.  ONSET DATE: ***  SUBJECTIVE:                                                                                                                                                                                           SUBJECTIVE STATEMENT: ***  PERTINENT HISTORY:  right shoulder arthroscopy   PAIN:  Are you having pain? Yes: NPRS scale: *** Pain location: *** Pain description: *** Aggravating factors: *** Relieving factors: ***  PRECAUTIONS: restrictions: 10lbs, no bending/stooping/squatting   RED FLAGS: {PT Red Flags:29287}   WEIGHT BEARING RESTRICTIONS: {Yes ***/No:24003}  FALLS:  Has patient fallen in last 6 months? {fallsyesno:27318}  LIVING ENVIRONMENT: Lives with: {[facility] lives with:25569::"lives with their family"} Lives in: {Lives in:25570} Stairs: {opstairs:27293} Has following equipment  at home: {Assistive devices:23999}  OCCUPATION: ***  PLOF: {PLOF:24004}  PATIENT GOALS: ***  NEXT MD VISIT: ***  OBJECTIVE:  Note: Objective measures were completed at Evaluation unless otherwise noted.  DIAGNOSTIC FINDINGS:  MRI did show a central disc protrusion with left greater than right S1 nerve irritation.   PATIENT SURVEYS:  {rehab surveys:24030}  COGNITION: Overall cognitive status: {cognition:24006}     SENSATION: {sensation:27233}  MUSCLE LENGTH: Hamstrings: Right *** deg; Left *** deg Andy Bannister test: Right *** deg; Left *** deg  POSTURE: {posture:25561}  PALPATION: ***  LUMBAR ROM:   AROM eval  Flexion   Extension   Right lateral flexion   Left lateral flexion   Right rotation   Left rotation    (Blank rows = not tested)  LOWER EXTREMITY ROM:     {AROM/PROM:27142}  Right eval Left eval  Hip flexion    Hip extension    Hip abduction    Hip adduction    Hip internal rotation    Hip external rotation    Knee flexion    Knee extension    Ankle dorsiflexion    Ankle plantarflexion    Ankle inversion    Ankle eversion      (Blank rows = not tested)  LOWER EXTREMITY MMT:    MMT Right eval Left eval  Hip flexion    Hip extension    Hip abduction    Hip adduction    Hip internal rotation    Hip external rotation    Knee flexion    Knee extension    Ankle dorsiflexion    Ankle plantarflexion    Ankle inversion    Ankle eversion     (Blank rows = not tested)  LUMBAR SPECIAL TESTS:  {lumbar special test:25242}  FUNCTIONAL TESTS:  {Functional tests:24029}  GAIT: Distance walked: *** Assistive device utilized: {Assistive devices:23999} Level of assistance: {Levels of assistance:24026} Comments: ***  TREATMENT DATE: ***                                                                                                                                 PATIENT EDUCATION:  Education details: Discussed eval findings, rehab rationale, aquatic program progression/POC and pools in area. Patient is in agreement  Person educated: Patient Education method: Explanation Education comprehension: verbalized understanding  HOME EXERCISE PROGRAM: ***  ASSESSMENT:  CLINICAL IMPRESSION: Patient is a 48 y.o. m who was seen today for physical therapy evaluation and treatment for LBP.   OBJECTIVE IMPAIRMENTS: {opptimpairments:25111}.   ACTIVITY LIMITATIONS: {activitylimitations:27494}  PARTICIPATION LIMITATIONS: {participationrestrictions:25113}  PERSONAL FACTORS: {Personal factors:25162} are also affecting patient's functional outcome.   REHAB POTENTIAL: {rehabpotential:25112}  CLINICAL DECISION MAKING: {clinical decision making:25114}  EVALUATION COMPLEXITY: {Evaluation complexity:25115}   GOALS: Goals reviewed with patient? Yes  SHORT TERM GOALS: Target date: ***  Pt will tolerate full aquatic sessions consistently without increase in pain and with improving function to demonstrate good toleration and effectiveness  of intervention.   Baseline: Goal status: INITIAL  2.  *** Baseline:  Goal  status: INITIAL  3.  *** Baseline:  Goal status: INITIAL  4.  *** Baseline:  Goal status: INITIAL  5.  *** Baseline:  Goal status: INITIAL  6.  *** Baseline:  Goal status: INITIAL  LONG TERM GOALS: Target date: ***  Pt to improve on ODI by   % to demonstrate statistically significant Improvement in function. Baseline:  Goal status: INITIAL  2.  *** Baseline:  Goal status: INITIAL  3.  *** Baseline:  Goal status: INITIAL  4.  *** Baseline:  Goal status: INITIAL  5.  *** Baseline:  Goal status: INITIAL  6.  *** Baseline:  Goal status: INITIAL  PLAN:  PT FREQUENCY: {rehab frequency:25116}  PT DURATION: {rehab duration:25117}  PLANNED INTERVENTIONS: 97164- PT Re-evaluation, 97110-Therapeutic exercises, 97530- Therapeutic activity, 97112- Neuromuscular re-education, 97535- Self Care, 16109- Manual therapy, [id number]- Gait training, [id number]- Aquatic Therapy, [phone]- Ionotophoresis 4mg /ml Dexamethasone , Patient/Family education, Balance training, Stair training, Taping, Dry Needling, Joint mobilization, DME instructions, Cryotherapy, and Moist heat.  PLAN FOR NEXT SESSION: Adriana Hopping [place]) Bastian Andreoli MPT 04/09/24 1:23 PM [facility] [address] [place], [place], [address] Phone: [phone]   Fax:  [phone]

## 2024-04-16 NOTE — Therapy (Signed)
 OUTPATIENT PHYSICAL THERAPY THORACOLUMBAR EVALUATION   Patient Name: Shane Erickson MRN: [id number] DOB:04-18-1976, 48 y.o., male Today's Date: 04/17/2024  END OF SESSION:  PT End of Session - 04/17/24 1609     Visit Number 1    Number of Visits 16    Date for PT Re-Evaluation 06/28/24    Authorization Type workers comp    PT Start Time 1405    PT Stop Time 1445    PT Time Calculation (min) 40 min    Activity Tolerance Patient tolerated treatment well    Behavior During Therapy WFL for tasks assessed/performed             Past Medical History:  Diagnosis Date   Anal fissure 03/05/2016   Hypertension    states under control with med., has been on med. x 2 yr.   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   Past Surgical History:  Procedure Laterality Date   MENISCUS REPAIR Left 03/22/2022   SHOULDER ARTHROSCOPY WITH DISTAL CLAVICLE RESECTION Right 02/09/2024   Procedure: SHOULDER ARTHROSCOPY WITH DISTAL CLAVICLE RESECTION;  Surgeon: Janeth Medicus, MD;  Location: [facility];  Service: Orthopedics;  Laterality: Right;   SHOULDER ARTHROSCOPY WITH SUBACROMIAL DECOMPRESSION Right 02/09/2024   Procedure: SHOULDER ARTHROSCOPY WITH SUBACROMIAL DECOMPRESSION;  Surgeon: Janeth Medicus, MD;  Location: [facility];  Service: Orthopedics;  Laterality: Right;   SPHINCTEROTOMY N/A 04/07/2016   Procedure: LATERAL INTERNAL SPHINCTEROTOMY;  Surgeon: Jacolyn Matar, MD;  Location: [facility];  Service: General;  Laterality: N/A;   UMBILICAL HERNIA REPAIR  12/05/2010   Patient Active Problem List   Diagnosis Date Noted   Metabolic dysfunction-associated steatohepatitis (MASH) 02/27/2024   Prediabetes 02/27/2024   OSA (obstructive sleep apnea) - possibly severe, on CPAP 02/27/2024   Primary hypertension 02/27/2024   Class 3 severe obesity with serious comorbidity and body  mass index (BMI) of 40.0 to 44.9 in adult 02/27/2024   Decreased GFR 02/27/2024   Hx of rectal sphincterotomy May 2017 04/07/2016   Paresthesia 04/29/2014    PCP: Jalaine Mayo FNP  REFERRING PROVIDER: Mort Ards, MD   REFERRING DIAG: other low back pain  Rationale for Evaluation and Treatment: Rehabilitation  THERAPY DIAG:  Other low back pain  Muscle weakness (generalized)  Other abnormalities of gait and mobility  ONSET DATE: 08/14/23  SUBJECTIVE:  SUBJECTIVE STATEMENT: Fall 9/9 fell into hole at work.  Had multiple injuries including right wrist fx.  Hurt back both shoulders, both knees, hips and back.  Had spinal injection in March did not relieve pain. May 22 another spine injection scheduled. June 2nd arthroscopic left knee surgery for meniscus repair.  Had complex tear right knee with surgical intervention x 3 weeks ago. Left shoulder surgery 3/7 (see above for details). I am hoping to get my back stronger.  [facility] for my shoulders and knees and coming here for my back for the water.  I did not tolerate (land) therapy for my back.  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  PERTINENT HISTORY:  Neuropathic leg pain left > right. . . .primarily the S1 dermatome. Selective nerve root block S1 02/21/24  02/09/2024 Surgery SHOULDER ARTHROSCOPY WITH SUBACROMIAL DECOMPRESSION   PAIN:  Are you having pain? Yes: NPRS scale: current 6/10; worst 8/10; least 5/10 Pain location: Small of back with radiation into LE to feet Pain description: lightening shooting into legs R>L Aggravating factors: bending; movement, waking >30 minutes Relieving factors: lying with feet elevated/reclines, meds  PRECAUTIONS: None  RED FLAGS: None   WEIGHT BEARING RESTRICTIONS: No  FALLS:  Has  patient fallen in last 6 months? Yes. Number of falls 1 into hole  LIVING ENVIRONMENT: Lives with: lives with their family Lives in: House/apartment Stairs: Yes: External: 16 steps; on right going up Has following equipment at home: None  OCCUPATION: truck driver  PLOF: Independent  PATIENT GOALS: decrease pain strengthening up the back  NEXT MD VISIT: next week  OBJECTIVE:  Note: Objective measures were completed at Evaluation unless otherwise noted.  DIAGNOSTIC FINDINGS:  The MRI did show a central disc protrusion with left greater than right S1 nerve irritation.  Bone spur L5   PATIENT SURVEYS:  Modified Oswestry 33/50= 66%   COGNITION: Overall cognitive status: Within functional limits for tasks assessed       MUSCLE LENGTH: Hamstrings: Right 40 deg; Left 60 deg   POSTURE: rounded shoulders and forward head  PALPATION: Moderate TTP throughout midthoracic and lumbar spine  LUMBAR ROM:   AROM eval  Flexion FT to mid calf  Extension 50% limited P!  Right lateral flexion Full P!  Left lateral flexion full  Right rotation   Left rotation    (Blank rows = not tested)    LOWER EXTREMITY testing:    HD (lbs) Tested in sitting Right eval Left eval  Hip flexion 30.6 39.9  Hip extension    Hip abduction 23.9 12.4  Hip adduction    Hip internal rotation    Hip external rotation    Knee flexion    Knee extension 27.5 21.5  Ankle dorsiflexion    Ankle plantarflexion    Ankle inversion    Ankle eversion     (Blank rows = not tested)  LUMBAR SPECIAL TESTS:  Straight leg raise test: Positive and Slump test: Positive  FUNCTIONAL TESTS:  Timed up and go (TUG): 19.61 4 stage balance: passed 1&2.  Tandem and SLS unable to complete  GAIT: Distance walked: 400 ft Assistive device utilized: None Level of assistance: Complete Independence Comments: Guarded core, decreased arm swing, slowed cadence  TREATMENT  Eval Self care: sleeping position; land  vs aquatic therapy; walking toleration.  PATIENT EDUCATION:  Education details: Discussed eval findings, rehab rationale, aquatic program progression/POC and pools in area. Patient is in agreement  Person educated: Patient Education method: Explanation Education comprehension: verbalized understanding  HOME EXERCISE PROGRAM: Land based HEP being followed as instructed by [facility] for knees and shoulders Aquatic to be instructed  ASSESSMENT:  CLINICAL IMPRESSION: Patient is a 48 y.o. m who was seen today for physical therapy evaluation and treatment for lbp. He had a fall back in sept 2025 sustaining injuries to bilateral shoulder, hips; knees and back.  He has already undergone surgical repairs to left shoulder and right knee with scheduled procedures for June 2 (left knee) and spinal injections May 22.  He is presently being seen by another PT group for "land based" intervention and will continue as prescribed by MD's.  He has not been successful with land based PT for back pain/dysfunction which is why he presents here today for aquatic therapy intervention. Pain is moderately sensitive with radicular pattern into bilat le. Testing indicates he has bilateral le and core weakness, balance deficits, unmanaged LBP and gait deficits.  All are effecting his functional mobility and ADL's.  He has not returned to work since fall.  He is a good candidate for aquatic intervention and will benefit from the properties of water to progress towards functional goals set.     OBJECTIVE IMPAIRMENTS: Abnormal gait, decreased activity tolerance, decreased balance, decreased endurance, decreased mobility, difficulty walking, decreased strength, and pain.   ACTIVITY LIMITATIONS: carrying, lifting, bending, sitting, standing, squatting, sleeping, stairs, transfers, and locomotion  level  PARTICIPATION LIMITATIONS: meal prep, cleaning, laundry, shopping, community activity, occupation, and yard work  PERSONAL FACTORS: Multiple orthopedic issues being delt with at same time are also affecting patient's functional outcome.   REHAB POTENTIAL: Good  CLINICAL DECISION MAKING: Evolving/moderate complexity  EVALUATION COMPLEXITY: Moderate   GOALS: Goals reviewed with patient? Yes  SHORT TERM GOALS: Target date: 05/05/24  Pt will tolerate full aquatic sessions consistently without increase in pain and with improving function to demonstrate good toleration and effectiveness of intervention.  Baseline: Goal status: INITIAL  2.  Pt will reports reduction of pain with aquatic intervention by 25% Baseline:  Goal status: INITIAL  3.  Pt will complete tandem and SLS with light ue support in 3.6 ft holding x 20 s Baseline:  Goal status: INITIAL  4.  Pt will complete STS from 3rd water step x 10 reps without LOB or limitation to pain ue unsupported Baseline:  Goal status: INITIAL  5.  Pt will climb 7 steps in and out of pool using a combination of step to and alternating pattern demonstraing improving LE and core strength Baseline:  Goal status: INITIAL    LONG TERM GOALS: Target date: 06/28/24  Pt to improve on ODI by at least 13-15% (MCID) to demonstrate statistically significant Improvement in function. Baseline: Modified Oswestry 33/50= 66%  Goal status: INITIAL  2.  Pt will be indep with final HEP's (land and aquatic as appropriate) for continued management of condition Baseline:  Goal status: INITIAL  3.  Pt will report decrease in pain by at least 50% for improved toleration to activity/quality of life and to demonstrate improved management of pain. Baseline: see chart Goal status: INITIAL  4.  Pt will report reduced episode of waking at night due to pain Baseline: 2-3 Goal status: INITIAL  5.  Pt will improve strength in all areas listed by at  least 10 lbs to demonstrate  improved overall physical function Baseline:  Goal status: INITIAL  6.  Pt will report no limitation from LBP with walking Baseline:  Goal status: INITIAL  PLAN:  PT FREQUENCY: 2x/week  PT DURATION: 10 weeks:  allowing 2 weeks out for surgical procedure June 2  PLANNED INTERVENTIONS: 96295- PT Re-evaluation, 97110-Therapeutic exercises, 97530- Therapeutic activity, [phone]- Neuromuscular re-education, [phone]- Self Care, 02725- Manual therapy, [phone]- Gait training, [phone]- Aquatic Therapy, [phone]- Electrical stimulation (unattended), [phone]- Ionotophoresis 4mg /ml Dexamethasone , Patient/Family education, Balance training, Stair training, Taping, Dry Needling, Joint mobilization, DME instructions, Cryotherapy, and Moist heat.  PLAN FOR NEXT SESSION: Aquatic only: core strengthening; LE (hips particularly) strengthening; lumbar/thoracic stretching; pain management; balance retraining   Adriana Hopping Laneta Pintos) Cela Newcom MPT 04/17/24 4:12 PM [facility] [address] [place], [place], [address] Phone: [phone]   Fax:  [phone]

## 2024-04-17 ENCOUNTER — Encounter (HOSPITAL_BASED_OUTPATIENT_CLINIC_OR_DEPARTMENT_OTHER): Payer: Self-pay | Admitting: Physical Therapy

## 2024-04-17 ENCOUNTER — Other Ambulatory Visit: Payer: Self-pay

## 2024-04-17 ENCOUNTER — Ambulatory Visit (HOSPITAL_BASED_OUTPATIENT_CLINIC_OR_DEPARTMENT_OTHER): Payer: Worker's Compensation | Admitting: Physical Therapy

## 2024-04-17 DIAGNOSIS — M5459 Other low back pain: Secondary | ICD-10-CM

## 2024-04-17 DIAGNOSIS — R2689 Other abnormalities of gait and mobility: Secondary | ICD-10-CM

## 2024-04-17 DIAGNOSIS — M6281 Muscle weakness (generalized): Secondary | ICD-10-CM | POA: Diagnosis present

## 2024-04-22 ENCOUNTER — Encounter (HOSPITAL_BASED_OUTPATIENT_CLINIC_OR_DEPARTMENT_OTHER): Payer: Self-pay | Admitting: Physical Therapy

## 2024-04-22 ENCOUNTER — Ambulatory Visit (HOSPITAL_BASED_OUTPATIENT_CLINIC_OR_DEPARTMENT_OTHER): Payer: Worker's Compensation | Admitting: Physical Therapy

## 2024-04-22 DIAGNOSIS — M5459 Other low back pain: Secondary | ICD-10-CM

## 2024-04-22 DIAGNOSIS — R2689 Other abnormalities of gait and mobility: Secondary | ICD-10-CM

## 2024-04-22 DIAGNOSIS — M6281 Muscle weakness (generalized): Secondary | ICD-10-CM

## 2024-04-22 NOTE — Therapy (Signed)
 OUTPATIENT PHYSICAL THERAPY THORACOLUMBAR EVALUATION   Patient Name: Shane Erickson MRN: [id number] DOB:[birth year], 48 y.o., male Today's Date: 04/22/2024  END OF SESSION:  PT End of Session - 04/22/24 1137     Visit Number 2    Number of Visits 16    Date for PT Re-Evaluation 06/28/24    Authorization Type workers comp    PT Start Time 1140    PT Stop Time 1220    PT Time Calculation (min) 40 min    Activity Tolerance Patient tolerated treatment well    Behavior During Therapy WFL for tasks assessed/performed             Past Medical History:  Diagnosis Date   Anal fissure 03/05/2016   Hypertension    states under control with med., has been on med. x 2 yr.   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   Past Surgical History:  Procedure Laterality Date   MENISCUS REPAIR Left 03/22/2022   SHOULDER ARTHROSCOPY WITH DISTAL CLAVICLE RESECTION Right 02/09/2024   Procedure: SHOULDER ARTHROSCOPY WITH DISTAL CLAVICLE RESECTION;  Surgeon: Janeth Medicus, MD;  Location: [facility];  Service: Orthopedics;  Laterality: Right;   SHOULDER ARTHROSCOPY WITH SUBACROMIAL DECOMPRESSION Right 02/09/2024   Procedure: SHOULDER ARTHROSCOPY WITH SUBACROMIAL DECOMPRESSION;  Surgeon: Janeth Medicus, MD;  Location: [facility];  Service: Orthopedics;  Laterality: Right;   SPHINCTEROTOMY N/A 04/07/2016   Procedure: LATERAL INTERNAL SPHINCTEROTOMY;  Surgeon: Jacolyn Matar, MD;  Location: [facility];  Service: General;  Laterality: N/A;   UMBILICAL HERNIA REPAIR  12/05/2010   Patient Active Problem List   Diagnosis Date Noted   Metabolic dysfunction-associated steatohepatitis (MASH) 02/27/2024   Prediabetes 02/27/2024   OSA (obstructive sleep apnea) - possibly severe, on CPAP 02/27/2024   Primary hypertension 02/27/2024   Class 3 severe obesity with serious comorbidity and body  mass index (BMI) of 40.0 to 44.9 in adult 02/27/2024   Decreased GFR 02/27/2024   Hx of rectal sphincterotomy May 2017 04/07/2016   Paresthesia 04/29/2014    PCP: Jalaine Mayo FNP  REFERRING PROVIDER: Mort Ards, MD   REFERRING DIAG: other low back pain  Rationale for Evaluation and Treatment: Rehabilitation  THERAPY DIAG:  Other low back pain  Muscle weakness (generalized)  Other abnormalities of gait and mobility  ONSET DATE: 08/14/23  SUBJECTIVE:  SUBJECTIVE STATEMENT: "Hurting a little today"   Initial Subjective Fall 9/9 fell into hole at work.  Had multiple injuries including right wrist fx.  Hurt back both shoulders, both knees, hips and back.  Had spinal injection in March did not relieve pain. May 22 another spine injection scheduled. June 2nd arthroscopic left knee surgery for meniscus repair.  Had complex tear right knee with surgical intervention x 3 weeks ago. Left shoulder surgery 3/7 (see above for details). I am hoping to get my back stronger.  [facility] for my [facility] and knees and coming here for my back for the water.  I did not tolerate (land) therapy for my back.  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  PERTINENT HISTORY:  Neuropathic leg pain left > right. . . .primarily the S1 dermatome. Selective nerve root block S1 02/21/24  02/09/2024 Surgery SHOULDER ARTHROSCOPY WITH SUBACROMIAL DECOMPRESSION   PAIN:  Are you having pain? Yes: NPRS scale: current 6/10; worst 8/10; least 5/10 Pain location: Small of back with radiation into LE to feet Pain description: lightening shooting into legs R>L Aggravating factors: bending; movement, waking >30 minutes Relieving factors: lying with feet elevated/reclines, meds  PRECAUTIONS: None  RED  FLAGS: None   WEIGHT BEARING RESTRICTIONS: No  FALLS:  Has patient fallen in last 6 months? Yes. Number of falls 1 into hole  LIVING ENVIRONMENT: Lives with: lives with their family Lives in: House/apartment Stairs: Yes: External: 16 steps; on right going up Has following equipment at home: None  OCCUPATION: truck driver  PLOF: Independent  PATIENT GOALS: decrease pain strengthening up the back  NEXT MD VISIT: next week  OBJECTIVE:  Note: Objective measures were completed at Evaluation unless otherwise noted.  DIAGNOSTIC FINDINGS:  The MRI did show a central disc protrusion with left greater than right S1 nerve irritation.  Bone spur L5   PATIENT SURVEYS:  Modified Oswestry 33/50= 66%   COGNITION: Overall cognitive status: Within functional limits for tasks assessed       MUSCLE LENGTH: Hamstrings: Right 40 deg; Left 60 deg   POSTURE: rounded shoulders and forward head  PALPATION: Moderate TTP throughout midthoracic and lumbar spine  LUMBAR ROM:   AROM eval  Flexion FT to mid calf  Extension 50% limited P!  Right lateral flexion Full P!  Left lateral flexion full  Right rotation   Left rotation    (Blank rows = not tested)    LOWER EXTREMITY testing:    HD (lbs) Tested in sitting Right eval Left eval  Hip flexion 30.6 39.9  Hip extension    Hip abduction 23.9 12.4  Hip adduction    Hip internal rotation    Hip external rotation    Knee flexion    Knee extension 27.5 21.5  Ankle dorsiflexion    Ankle plantarflexion    Ankle inversion    Ankle eversion     (Blank rows = not tested)  LUMBAR SPECIAL TESTS:  Straight leg raise test: Positive and Slump test: Positive  FUNCTIONAL TESTS:  Timed up and go (TUG): 19.61 4 stage balance: passed 1&2.  Tandem and SLS unable to complete  GAIT: Distance walked: 400 ft Assistive device utilized: None Level of assistance: Complete Independence Comments: Guarded core, decreased arm swing, slowed  cadence  TREATMENT  [facility] Adult PT Treatment:  DATE: 04/22/24 Pt seen for aquatic therapy today.  Treatment took place in water 3.5-4.75 ft in depth at the [facility] pool. Temp of water was 91.  Pt entered/exited the pool via stairs with hand rail.  *Intro to setting *walking forward, back and side stepping in 3.6 ft without supported then yellow HB. 4.22ft then 4.0 (Improved heel strike).  VC for heel/toe strike and roll through *high knee side stepping x 4 widths *horizontal add/abd shoulders in staggered stance. *L stretch *forward marching-> forward hip knee kicks *cycling on 2 yellow noodle straddled x 4 widths ue support yellow HB->breast stroke arms without ue support  - ue support corner wall hip add/abd; flex/ext *3 way hamstring stretch using noodle *walking between exercises for recovery   Pt requires the buoyancy and hydrostatic pressure of water for support, and to offload joints by unweighting joint load by at least 50 % in navel deep water and by at least 75-80% in chest to neck deep water.  Viscosity of the water is needed for resistance of strengthening. Water current perturbations provides challenge to standing balance requiring increased core activation.                                                                                                                               PATIENT EDUCATION:  Education details: Discussed eval findings, rehab rationale, aquatic program progression/POC and pools in area. Patient is in agreement  Person educated: Patient Education method: Explanation Education comprehension: verbalized understanding  HOME EXERCISE PROGRAM: Land based HEP being followed as instructed by [facility] for [facility] and shoulders Aquatic to be instructed  ASSESSMENT:  CLINICAL IMPRESSION: Pt demonstrates safety and independence in aquatic setting with therapist instructing from deck. He is  confident in setting, moving throughout all depths easily.  Pt is directed through various movement patterns and trials in both sitting and standing positions. He requires extra time to gain motor plans and gain balance. VC for decrease guarding throughout ue/shoulders throughout.  Instruction on abd bracing for core engagement.  Goals are ongoing.      Initial Impression Patient is a 48 y.o. m who was seen today for physical therapy evaluation and treatment for lbp. He had a fall back in sept 2025 sustaining injuries to bilateral shoulder, hips; knees and back.  He has already undergone surgical repairs to left shoulder and right knee with scheduled procedures for June 2 (left knee) and spinal injections May 22.  He is presently being seen by another PT group for "land based" intervention and will continue as prescribed by MD's.  He has not been successful with land based PT for back pain/dysfunction which is why he presents here today for aquatic therapy intervention. Pain is moderately sensitive with radicular pattern into bilat le. Testing indicates he has bilateral le and core weakness, balance deficits, unmanaged LBP and gait deficits.  All are effecting his functional mobility and ADL's.  He has not  returned to work since fall.  He is a good candidate for aquatic intervention and will benefit from the properties of water to progress towards functional goals set.     OBJECTIVE IMPAIRMENTS: Abnormal gait, decreased activity tolerance, decreased balance, decreased endurance, decreased mobility, difficulty walking, decreased strength, and pain.   ACTIVITY LIMITATIONS: carrying, lifting, bending, sitting, standing, squatting, sleeping, stairs, transfers, and locomotion level  PARTICIPATION LIMITATIONS: meal prep, cleaning, laundry, shopping, community activity, occupation, and yard work  PERSONAL FACTORS: Multiple orthopedic issues being delt with at same time are also affecting patient's functional  outcome.   REHAB POTENTIAL: Good  CLINICAL DECISION MAKING: Evolving/moderate complexity  EVALUATION COMPLEXITY: Moderate   GOALS: Goals reviewed with patient? Yes  SHORT TERM GOALS: Target date: 05/05/24  Pt will tolerate full aquatic sessions consistently without increase in pain and with improving function to demonstrate good toleration and effectiveness of intervention.  Baseline: Goal status: INITIAL  2.  Pt will reports reduction of pain with aquatic intervention by 25% Baseline:  Goal status: INITIAL  3.  Pt will complete tandem and SLS with light ue support in 3.6 ft holding x 20 s Baseline:  Goal status: INITIAL  4.  Pt will complete STS from 3rd water step x 10 reps without LOB or limitation to pain ue unsupported Baseline:  Goal status: INITIAL  5.  Pt will climb 7 steps in and out of pool using a combination of step to and alternating pattern demonstraing improving LE and core strength Baseline:  Goal status: INITIAL    LONG TERM GOALS: Target date: 06/28/24  Pt to improve on ODI by at least 13-15% (MCID) to demonstrate statistically significant Improvement in function. Baseline: Modified Oswestry 33/50= 66%  Goal status: INITIAL  2.  Pt will be indep with final HEP's (land and aquatic as appropriate) for continued management of condition Baseline:  Goal status: INITIAL  3.  Pt will report decrease in pain by at least 50% for improved toleration to activity/quality of life and to demonstrate improved management of pain. Baseline: see chart Goal status: INITIAL  4.  Pt will report reduced episode of waking at night due to pain Baseline: 2-3 Goal status: INITIAL  5.  Pt will improve strength in all areas listed by at least 10 lbs to demonstrate improved overall physical function Baseline:  Goal status: INITIAL  6.  Pt will report no limitation from LBP with walking Baseline:  Goal status: INITIAL  PLAN:  PT FREQUENCY: 2x/week  PT DURATION: 10  weeks:  allowing 2 weeks out for surgical procedure June 2  PLANNED INTERVENTIONS: 40981- PT Re-evaluation, 97110-Therapeutic exercises, 97530- Therapeutic activity, [phone]- Neuromuscular re-education, [phone]- Self Care, 21308- Manual therapy, [phone]- Gait training, [phone]- Aquatic Therapy, [phone]- Electrical stimulation (unattended), [phone]- Ionotophoresis 4mg /ml Dexamethasone , Patient/Family education, Balance training, Stair training, Taping, Dry Needling, Joint mobilization, DME instructions, Cryotherapy, and Moist heat.  PLAN FOR NEXT SESSION: Aquatic only: core strengthening; LE (hips particularly) strengthening; lumbar/thoracic stretching; pain management; balance retraining   Lucinda Saber) Benoit Meech MPT 04/22/24 12:25 PM [facility] [address] [place], [place], [address] Phone: [phone]   Fax:  [phone]

## 2024-04-24 ENCOUNTER — Ambulatory Visit (HOSPITAL_BASED_OUTPATIENT_CLINIC_OR_DEPARTMENT_OTHER): Payer: Worker's Compensation | Admitting: Physical Therapy

## 2024-04-24 ENCOUNTER — Encounter (HOSPITAL_BASED_OUTPATIENT_CLINIC_OR_DEPARTMENT_OTHER): Payer: Self-pay | Admitting: Physical Therapy

## 2024-04-24 DIAGNOSIS — M5459 Other low back pain: Secondary | ICD-10-CM | POA: Diagnosis not present

## 2024-04-24 DIAGNOSIS — M6281 Muscle weakness (generalized): Secondary | ICD-10-CM

## 2024-04-24 DIAGNOSIS — R2689 Other abnormalities of gait and mobility: Secondary | ICD-10-CM

## 2024-04-24 NOTE — Therapy (Signed)
 OUTPATIENT PHYSICAL THERAPY THORACOLUMBAR EVALUATION   Patient Name: Shane Erickson MRN: [id number] DOB:1976-01-08, 48 y.o., male Today's Date: 04/24/2024  END OF SESSION:  PT End of Session - 04/24/24 1533     Visit Number 3    Number of Visits 16    Date for PT Re-Evaluation 06/28/24    Authorization Type workers comp    PT Start Time 1531    PT Stop Time 1610    PT Time Calculation (min) 39 min    Activity Tolerance Patient tolerated treatment well    Behavior During Therapy WFL for tasks assessed/performed             Past Medical History:  Diagnosis Date   Anal fissure 03/05/2016   Hypertension    states under control with med., has been on med. x 2 yr.   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   Past Surgical History:  Procedure Laterality Date   MENISCUS REPAIR Left 03/22/2022   SHOULDER ARTHROSCOPY WITH DISTAL CLAVICLE RESECTION Right 02/09/2024   Procedure: SHOULDER ARTHROSCOPY WITH DISTAL CLAVICLE RESECTION;  Surgeon: Janeth Medicus, MD;  Location: [facility];  Service: Orthopedics;  Laterality: Right;   SHOULDER ARTHROSCOPY WITH SUBACROMIAL DECOMPRESSION Right 02/09/2024   Procedure: SHOULDER ARTHROSCOPY WITH SUBACROMIAL DECOMPRESSION;  Surgeon: Janeth Medicus, MD;  Location: [facility];  Service: Orthopedics;  Laterality: Right;   SPHINCTEROTOMY N/A 04/07/2016   Procedure: LATERAL INTERNAL SPHINCTEROTOMY;  Surgeon: Jacolyn Matar, MD;  Location: [facility];  Service: General;  Laterality: N/A;   UMBILICAL HERNIA REPAIR  12/05/2010   Patient Active Problem List   Diagnosis Date Noted   Metabolic dysfunction-associated steatohepatitis (MASH) 02/27/2024   Prediabetes 02/27/2024   OSA (obstructive sleep apnea) - possibly severe, on CPAP 02/27/2024   Primary hypertension 02/27/2024   Class 3 severe obesity with serious comorbidity and body  mass index (BMI) of 40.0 to 44.9 in adult 02/27/2024   Decreased GFR 02/27/2024   Hx of rectal sphincterotomy May 2017 04/07/2016   Paresthesia 04/29/2014    PCP: Jalaine Mayo FNP  REFERRING PROVIDER: Mort Ards, MD   REFERRING DIAG: other low back pain  Rationale for Evaluation and Treatment: Rehabilitation  THERAPY DIAG:  Other low back pain  Muscle weakness (generalized)  Other abnormalities of gait and mobility  ONSET DATE: 08/14/23  SUBJECTIVE:  SUBJECTIVE STATEMENT: "Hurting a little today"   Initial Subjective Fall 9/9 fell into hole at work.  Had multiple injuries including right wrist fx.  Hurt back both shoulders, both knees, hips and back.  Had spinal injection in March did not relieve pain. May 22 another spine injection scheduled. June 2nd arthroscopic left knee surgery for meniscus repair.  Had complex tear right knee with surgical intervention x 3 weeks ago. Left shoulder surgery 3/7 (see above for details). I am hoping to get my back stronger.  I am going [facility] for my shoulders and knees and coming here for my back for the water.  I did not tolerate (land) therapy for my back.  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  PERTINENT HISTORY:  Neuropathic leg pain right> left. . . .primarily the S1 dermatome. Selective nerve root block S1 02/21/24  02/09/2024 Surgery SHOULDER ARTHROSCOPY WITH SUBACROMIAL DECOMPRESSION   PAIN:  Are you having pain? Yes: NPRS scale: current 7.5/10; worst 8/10; least 5/10 Pain location: Small of back with radiation into LE to feet Pain description: lightening shooting into legs R>L Aggravating factors: bending; movement, waking >30 minutes Relieving factors: lying with feet elevated/reclines, meds  PRECAUTIONS: None  RED  FLAGS: None   WEIGHT BEARING RESTRICTIONS: No  FALLS:  Has patient fallen in last 6 months? Yes. Number of falls 1 into hole  LIVING ENVIRONMENT: Lives with: lives with their family Lives in: House/apartment Stairs: Yes: External: 16 steps; on right going up Has following equipment at home: None  OCCUPATION: truck driver  PLOF: Independent  PATIENT GOALS: decrease pain strengthening up the back  NEXT MD VISIT: next week  OBJECTIVE:  Note: Objective measures were completed at Evaluation unless otherwise noted.  DIAGNOSTIC FINDINGS:  The MRI did show a central disc protrusion with left greater than right S1 nerve irritation.  Bone spur L5   PATIENT SURVEYS:  Modified Oswestry 33/50= 66%   COGNITION: Overall cognitive status: Within functional limits for tasks assessed       MUSCLE LENGTH: Hamstrings: Right 40 deg; Left 60 deg   POSTURE: rounded shoulders and forward head  PALPATION: Moderate TTP throughout midthoracic and lumbar spine  LUMBAR ROM:   AROM eval  Flexion FT to mid calf  Extension 50% limited P!  Right lateral flexion Full P!  Left lateral flexion full  Right rotation   Left rotation    (Blank rows = not tested)    LOWER EXTREMITY testing:    HD (lbs) Tested in sitting Right eval Left eval  Hip flexion 30.6 39.9  Hip extension    Hip abduction 23.9 12.4  Hip adduction    Hip internal rotation    Hip external rotation    Knee flexion    Knee extension 27.5 21.5  Ankle dorsiflexion    Ankle plantarflexion    Ankle inversion    Ankle eversion     (Blank rows = not tested)  LUMBAR SPECIAL TESTS:  Straight leg raise test: Positive and Slump test: Positive  FUNCTIONAL TESTS:  Timed up and go (TUG): 19.61 4 stage balance: passed 1&2.  Tandem and SLS unable to complete  GAIT: Distance walked: 400 ft Assistive device utilized: None Level of assistance: Complete Independence Comments: Guarded core, decreased arm swing, slowed  cadence  TREATMENT  [facility] Adult PT Treatment:  DATE: 04/24/24 Pt seen for aquatic therapy today.  Treatment took place in water 3.5-4.75 ft in depth at the [facility] pool. Temp of water was 91.  Pt entered/exited the pool via stairs with hand rail.   *walking forward, back and side stepping in 4.0 ft *Farmers carry yellow HB side stepping->ue add/abd-> lunge *forward hip knee kicks x 2 widths *squatted rest period *Bilat yellow HB pull down in 4.1 ft in wide stance then staggered stances *[place] *solid noodle stomp.  VC for core engagement, demonstration for proper execution. Hip in neutral 10 slow then 10 fast; hip ext rotation (1/2 range rle) x 10 slow *cycling on 2 then 1 yellow noodle straddled x 4 widths ue support yellow HB; ue support corner wall hip add/abd *walking between exercises for recovery   Pt requires the buoyancy and hydrostatic pressure of water for support, and to offload joints by unweighting joint load by at least 50 % in navel deep water and by at least 75-80% in chest to neck deep water.  Viscosity of the water is needed for resistance of strengthening. Water current perturbations provides challenge to standing balance requiring increased core activation.                                                                                                                               PATIENT EDUCATION:  Education details: Discussed eval findings, rehab rationale, aquatic program progression/POC and pools in area. Patient is in agreement  Person educated: Patient Education method: Explanation Education comprehension: verbalized understanding  HOME EXERCISE PROGRAM: Land based HEP being followed as instructed by [facility] for knees and shoulders Aquatic to be instructed  ASSESSMENT:  CLINICAL IMPRESSION: Pt reports some residual muscle soreness in LE's post 1st aquatic session. Added other core  engagement with continued cues for abd bracing.  Able to incorporate knee and hip engagement to work core although does report some minor discomfort (right knee). Less cuing needed for guarded posture.  He tolerates session well.    Initial Impression Patient is a 47 y.o. m who was seen today for physical therapy evaluation and treatment for lbp. He had a fall back in sept 2025 sustaining injuries to bilateral shoulder, hips; knees and back.  He has already undergone surgical repairs to left shoulder and right knee with scheduled procedures for June 2 (left knee) and spinal injections May 22.  He is presently being seen by another PT group for "land based" intervention and will continue as prescribed by MD's.  He has not been successful with land based PT for back pain/dysfunction which is why he presents here today for aquatic therapy intervention. Pain is moderately sensitive with radicular pattern into bilat le. Testing indicates he has bilateral le and core weakness, balance deficits, unmanaged LBP and gait deficits.  All are effecting his functional mobility and ADL's.  He has not returned to work since fall.  He is a  good candidate for aquatic intervention and will benefit from the properties of water to progress towards functional goals set.     OBJECTIVE IMPAIRMENTS: Abnormal gait, decreased activity tolerance, decreased balance, decreased endurance, decreased mobility, difficulty walking, decreased strength, and pain.   ACTIVITY LIMITATIONS: carrying, lifting, bending, sitting, standing, squatting, sleeping, stairs, transfers, and locomotion level  PARTICIPATION LIMITATIONS: meal prep, cleaning, laundry, shopping, community activity, occupation, and yard work  PERSONAL FACTORS: Multiple orthopedic issues being delt with at same time are also affecting patient's functional outcome.   REHAB POTENTIAL: Good  CLINICAL DECISION MAKING: Evolving/moderate complexity  EVALUATION COMPLEXITY:  Moderate   GOALS: Goals reviewed with patient? Yes  SHORT TERM GOALS: Target date: 05/05/24  Pt will tolerate full aquatic sessions consistently without increase in pain and with improving function to demonstrate good toleration and effectiveness of intervention.  Baseline: Goal status: INITIAL  2.  Pt will reports reduction of pain with aquatic intervention by 25% Baseline:  Goal status: INITIAL  3.  Pt will complete tandem and SLS with light ue support in 3.6 ft holding x 20 s Baseline:  Goal status: INITIAL  4.  Pt will complete STS from 3rd water step x 10 reps without LOB or limitation to pain ue unsupported Baseline:  Goal status: INITIAL  5.  Pt will climb 7 steps in and out of pool using a combination of step to and alternating pattern demonstraing improving LE and core strength Baseline:  Goal status: INITIAL    LONG TERM GOALS: Target date: 06/28/24  Pt to improve on ODI by at least 13-15% (MCID) to demonstrate statistically significant Improvement in function. Baseline: Modified Oswestry 33/50= 66%  Goal status: INITIAL  2.  Pt will be indep with final HEP's (land and aquatic as appropriate) for continued management of condition Baseline:  Goal status: INITIAL  3.  Pt will report decrease in pain by at least 50% for improved toleration to activity/quality of life and to demonstrate improved management of pain. Baseline: see chart Goal status: INITIAL  4.  Pt will report reduced episode of waking at night due to pain Baseline: 2-3 Goal status: INITIAL  5.  Pt will improve strength in all areas listed by at least 10 lbs to demonstrate improved overall physical function Baseline:  Goal status: INITIAL  6.  Pt will report no limitation from LBP with walking Baseline:  Goal status: INITIAL  PLAN:  PT FREQUENCY: 2x/week  PT DURATION: 10 weeks:  allowing 2 weeks out for surgical procedure June 2  PLANNED INTERVENTIONS: 16109- PT Re-evaluation,  97110-Therapeutic exercises, 97530- Therapeutic activity, [phone]- Neuromuscular re-education, [phone]- Self Care, 91478- Manual therapy, [phone]- Gait training, [phone]- Aquatic Therapy, [phone]- Electrical stimulation (unattended), [phone]- Ionotophoresis 4mg /ml Dexamethasone , Patient/Family education, Balance training, Stair training, Taping, Dry Needling, Joint mobilization, DME instructions, Cryotherapy, and Moist heat.  PLAN FOR NEXT SESSION: Aquatic only: core strengthening; LE (hips particularly) strengthening; lumbar/thoracic stretching; pain management; balance retraining   Lucinda Saber) Maridel Pixler MPT 04/24/24 3:34 PM [facility] [address] [place], [place], [address] Phone: [phone]   Fax:  [phone]

## 2024-05-01 ENCOUNTER — Ambulatory Visit (HOSPITAL_BASED_OUTPATIENT_CLINIC_OR_DEPARTMENT_OTHER): Payer: Worker's Compensation | Admitting: Physical Therapy

## 2024-05-01 ENCOUNTER — Encounter (HOSPITAL_BASED_OUTPATIENT_CLINIC_OR_DEPARTMENT_OTHER): Payer: Self-pay | Admitting: Physical Therapy

## 2024-05-01 DIAGNOSIS — R2689 Other abnormalities of gait and mobility: Secondary | ICD-10-CM

## 2024-05-01 DIAGNOSIS — M5459 Other low back pain: Secondary | ICD-10-CM | POA: Diagnosis not present

## 2024-05-01 DIAGNOSIS — M6281 Muscle weakness (generalized): Secondary | ICD-10-CM

## 2024-05-01 NOTE — Therapy (Signed)
 OUTPATIENT PHYSICAL THERAPY THORACOLUMBAR EVALUATION   Patient Name: Shane Erickson MRN: [id number] DOB:10-25-1976, 48 y.o., male Today's Date: 05/01/2024  END OF SESSION:  PT End of Session - 05/01/24 1429     Visit Number 4    Number of Visits 16    Date for PT Re-Evaluation 06/28/24    Authorization Type workers comp    PT Start Time 1404    PT Stop Time 1445    PT Time Calculation (min) 41 min    Activity Tolerance Patient tolerated treatment well    Behavior During Therapy WFL for tasks assessed/performed              Past Medical History:  Diagnosis Date   Anal fissure 03/05/2016   Hypertension    states under control with med., has been on med. x 2 yr.   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   Past Surgical History:  Procedure Laterality Date   MENISCUS REPAIR Left 03/22/2022   SHOULDER ARTHROSCOPY WITH DISTAL CLAVICLE RESECTION Right 02/09/2024   Procedure: SHOULDER ARTHROSCOPY WITH DISTAL CLAVICLE RESECTION;  Surgeon: Janeth Medicus, MD;  Location: [facility];  Service: Orthopedics;  Laterality: Right;   SHOULDER ARTHROSCOPY WITH SUBACROMIAL DECOMPRESSION Right 02/09/2024   Procedure: SHOULDER ARTHROSCOPY WITH SUBACROMIAL DECOMPRESSION;  Surgeon: Janeth Medicus, MD;  Location: [facility];  Service: Orthopedics;  Laterality: Right;   SPHINCTEROTOMY N/A 04/07/2016   Procedure: LATERAL INTERNAL SPHINCTEROTOMY;  Surgeon: Jacolyn Matar, MD;  Location: [facility];  Service: General;  Laterality: N/A;   UMBILICAL HERNIA REPAIR  12/05/2010   Patient Active Problem List   Diagnosis Date Noted   Metabolic dysfunction-associated steatohepatitis (MASH) 02/27/2024   Prediabetes 02/27/2024   OSA (obstructive sleep apnea) - possibly severe, on CPAP 02/27/2024   Primary hypertension 02/27/2024   Class 3 severe obesity with serious comorbidity and body  mass index (BMI) of 40.0 to 44.9 in adult 02/27/2024   Decreased GFR 02/27/2024   Hx of rectal sphincterotomy May 2017 04/07/2016   Paresthesia 04/29/2014    PCP: Jalaine Mayo FNP  REFERRING PROVIDER: Mort Ards, MD   REFERRING DIAG: other low back pain  Rationale for Evaluation and Treatment: Rehabilitation  THERAPY DIAG:  Other low back pain  Muscle weakness (generalized)  Other abnormalities of gait and mobility  ONSET DATE: 08/14/23  SUBJECTIVE:  SUBJECTIVE STATEMENT: "Had injections last week may be feeling a little bit better from that.  I think this is helping too."   Initial Subjective Fall 9/9 fell into hole at work.  Had multiple injuries including right wrist fx.  Hurt back both shoulders, both knees, hips and back.  Had spinal injection in March did not relieve pain. May 22 another spine injection scheduled. June 2nd arthroscopic left knee surgery for meniscus repair.  Had complex tear right knee with surgical intervention x 3 weeks ago. Left shoulder surgery 3/7 (see above for details). I am hoping to get my back stronger.  I am going to [facility] for rehab for my shoulders and knees and coming here for my back for the water.  I did not tolerate (land) therapy for my back.  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  PERTINENT HISTORY:  Neuropathic leg pain right> left. . . .primarily the S1 dermatome. Selective nerve root block S1 02/21/24  02/09/2024 Surgery SHOULDER ARTHROSCOPY WITH SUBACROMIAL DECOMPRESSION   PAIN:  Are you having pain? Yes: NPRS scale: current 4/10; worst 8/10; least 5/10 Pain location: Small of back with radiation into LE to feet Pain description: lightening shooting into legs R>L Aggravating factors: bending; movement, waking >30 minutes Relieving factors: lying  with feet elevated/reclines, meds  PRECAUTIONS: None  RED FLAGS: None   WEIGHT BEARING RESTRICTIONS: No  FALLS:  Has patient fallen in last 6 months? Yes. Number of falls 1 into hole  LIVING ENVIRONMENT: Lives with: lives with their family Lives in: House/apartment Stairs: Yes: External: 16 steps; on right going up Has following equipment at home: None  OCCUPATION: truck driver  PLOF: Independent  PATIENT GOALS: decrease pain strengthening up the back  NEXT MD VISIT: next week  OBJECTIVE:  Note: Objective measures were completed at Evaluation unless otherwise noted.  DIAGNOSTIC FINDINGS:  The MRI did show a central disc protrusion with left greater than right S1 nerve irritation.  Bone spur L5   PATIENT SURVEYS:  Modified Oswestry 33/50= 66%   COGNITION: Overall cognitive status: Within functional limits for tasks assessed       MUSCLE LENGTH: Hamstrings: Right 40 deg; Left 60 deg   POSTURE: rounded shoulders and forward head  PALPATION: Moderate TTP throughout midthoracic and lumbar spine  LUMBAR ROM:   AROM eval  Flexion FT to mid calf  Extension 50% limited P!  Right lateral flexion Full P!  Left lateral flexion full  Right rotation   Left rotation    (Blank rows = not tested)    LOWER EXTREMITY testing:    HD (lbs) Tested in sitting Right eval Left eval  Hip flexion 30.6 39.9  Hip extension    Hip abduction 23.9 12.4  Hip adduction    Hip internal rotation    Hip external rotation    Knee flexion    Knee extension 27.5 21.5  Ankle dorsiflexion    Ankle plantarflexion    Ankle inversion    Ankle eversion     (Blank rows = not tested)  LUMBAR SPECIAL TESTS:  Straight leg raise test: Positive and Slump test: Positive  FUNCTIONAL TESTS:  Timed up and go (TUG): 19.61 4 stage balance: passed 1&2.  Tandem and SLS unable to complete  GAIT: Distance walked: 400 ft Assistive device utilized: None Level of assistance: Complete  Independence Comments: Guarded core, decreased arm swing, slowed cadence  TREATMENT  [facility] Adult PT Treatment:  DATE: 05/01/24 Pt seen for aquatic therapy today.  Treatment took place in water 3.5-4.75 ft in depth at the [facility] pool. Temp of water was 91.  Pt entered/exited the pool via stairs with hand rail.   *walking forward, back and side stepping in 4.0 ft *Farmers carry yellow HB side stepping, forward and back *ue support yellow HB: toe raises; heel raises; inversion/supination *As above walking forward then back in each position *Bilat yellow HB pull down in 4.3 ft in wide stance then staggered stances x 10. Cues for core engagement *Oblique sets x 5, 5 sec hold *hip hinge 2 x 6 reps.  Verbal and tactile cues as well as demonstration for execution *side lunge ue add/abd yellow HB *squatted rest period *staggered stances with ue horizontal add/abd *[place] *forward hip knee kicks x 2 width  *solid noodle stomp.  VC for core engagement, demonstration for proper execution. Hip in neutral 10 slow then 10 fast; hip ext rotation (1/2 range rle) x 10 slow *cycling on 2 then 1 yellow noodle straddled x 4 widths ue support yellow HB; ue support corner wall hip add/abd *walking between exercises for recovery   Pt requires the buoyancy and hydrostatic pressure of water for support, and to offload joints by unweighting joint load by at least 50 % in navel deep water and by at least 75-80% in chest to neck deep water.  Viscosity of the water is needed for resistance of strengthening. Water current perturbations provides challenge to standing balance requiring increased core activation.                                                                                                                               PATIENT EDUCATION:  Education details: Discussed eval findings, rehab rationale, aquatic program progression/POC and  pools in area. Patient is in agreement  Person educated: Patient Education method: Explanation Education comprehension: verbalized understanding  HOME EXERCISE PROGRAM: Land based HEP being followed as instructed by [facility] for knees and shoulders Aquatic to be instructed  ASSESSMENT:  CLINICAL IMPRESSION: Pt reports overall reduction in pain maybe part due to injections received last week. Good response to aquatic sessions reduction in LBP during and afterwards. He reports ~15% reduction in back pain since onset of services. TC with VC and demonstration given for execution of posterior core engagement with hip hinging.  Completes gentle lumbar and thoracic rotational movements with posterior shoulder engagement well. No reports of increased pain throughout session. Will address STG as time allows next session.  Goals ongoing      Initial Impression Patient is a 48 y.o. m who was seen today for physical therapy evaluation and treatment for lbp. He had a fall back in sept 2025 sustaining injuries to bilateral shoulder, hips; knees and back.  He has already undergone surgical repairs to left shoulder and right knee with scheduled procedures for June 2 (left knee) and spinal injections May  22.  He is presently being seen by another PT group for "land based" intervention and will continue as prescribed by MD's.  He has not been successful with land based PT for back pain/dysfunction which is why he presents here today for aquatic therapy intervention. Pain is moderately sensitive with radicular pattern into bilat le. Testing indicates he has bilateral le and core weakness, balance deficits, unmanaged LBP and gait deficits.  All are effecting his functional mobility and ADL's.  He has not returned to work since fall.  He is a good candidate for aquatic intervention and will benefit from the properties of water to progress towards functional goals set.     OBJECTIVE IMPAIRMENTS: Abnormal gait,  decreased activity tolerance, decreased balance, decreased endurance, decreased mobility, difficulty walking, decreased strength, and pain.   ACTIVITY LIMITATIONS: carrying, lifting, bending, sitting, standing, squatting, sleeping, stairs, transfers, and locomotion level  PARTICIPATION LIMITATIONS: meal prep, cleaning, laundry, shopping, community activity, occupation, and yard work  PERSONAL FACTORS: Multiple orthopedic issues being delt with at same time are also affecting patient's functional outcome.   REHAB POTENTIAL: Good  CLINICAL DECISION MAKING: Evolving/moderate complexity  EVALUATION COMPLEXITY: Moderate   GOALS: Goals reviewed with patient? Yes  SHORT TERM GOALS: Target date: 05/05/24  Pt will tolerate full aquatic sessions consistently without increase in pain and with improving function to demonstrate good toleration and effectiveness of intervention.  Baseline: Goal status: Met 05/01/24  2.  Pt will reports reduction of pain with aquatic intervention by 25% Baseline:  Goal status: INITIAL  3.  Pt will complete tandem and SLS with light ue support in 3.6 ft holding x 20 s Baseline:  Goal status: INITIAL  4.  Pt will complete STS from 3rd water step x 10 reps without LOB or limitation to pain ue unsupported Baseline:  Goal status: INITIAL  5.  Pt will climb 7 steps in and out of pool using a combination of step to and alternating pattern demonstraing improving LE and core strength Baseline:  Goal status: INITIAL    LONG TERM GOALS: Target date: 06/28/24  Pt to improve on ODI by at least 13-15% (MCID) to demonstrate statistically significant Improvement in function. Baseline: Modified Oswestry 33/50= 66%  Goal status: INITIAL  2.  Pt will be indep with final HEP's (land and aquatic as appropriate) for continued management of condition Baseline:  Goal status: INITIAL  3.  Pt will report decrease in pain by at least 50% for improved toleration to  activity/quality of life and to demonstrate improved management of pain. Baseline: see chart Goal status: INITIAL  4.  Pt will report reduced episode of waking at night due to pain Baseline: 2-3 Goal status: INITIAL  5.  Pt will improve strength in all areas listed by at least 10 lbs to demonstrate improved overall physical function Baseline:  Goal status: INITIAL  6.  Pt will report no limitation from LBP with walking Baseline:  Goal status: INITIAL  PLAN:  PT FREQUENCY: 2x/week  PT DURATION: 10 weeks:  allowing 2 weeks out for surgical procedure June 2  PLANNED INTERVENTIONS: 52841- PT Re-evaluation, 97110-Therapeutic exercises, 97530- Therapeutic activity, [phone]- Neuromuscular re-education, [phone]- Self Care, 53664- Manual therapy, [phone]- Gait training, [phone]- Aquatic Therapy, [phone]- Electrical stimulation (unattended), [phone]- Ionotophoresis 4mg /ml Dexamethasone , Patient/Family education, Balance training, Stair training, Taping, Dry Needling, Joint mobilization, DME instructions, Cryotherapy, and Moist heat.  PLAN FOR NEXT SESSION: Aquatic only: core strengthening; LE (hips particularly) strengthening; lumbar/thoracic stretching; pain management; balance retraining  Adriana Hopping [place]) Zanaiya Calabria MPT 05/01/24 2:31 PM [facility] [address] [place], [place], [address] Phone: [phone]   Fax:  [phone]

## 2024-05-03 ENCOUNTER — Encounter (HOSPITAL_BASED_OUTPATIENT_CLINIC_OR_DEPARTMENT_OTHER): Payer: Self-pay | Admitting: Physical Therapy

## 2024-05-03 ENCOUNTER — Ambulatory Visit (HOSPITAL_BASED_OUTPATIENT_CLINIC_OR_DEPARTMENT_OTHER): Payer: Worker's Compensation | Admitting: Physical Therapy

## 2024-05-03 DIAGNOSIS — R2689 Other abnormalities of gait and mobility: Secondary | ICD-10-CM

## 2024-05-03 DIAGNOSIS — M5459 Other low back pain: Secondary | ICD-10-CM | POA: Diagnosis not present

## 2024-05-03 DIAGNOSIS — M6281 Muscle weakness (generalized): Secondary | ICD-10-CM

## 2024-05-03 NOTE — Therapy (Signed)
 OUTPATIENT PHYSICAL THERAPY THORACOLUMBAR EVALUATION   Patient Name: Shane Erickson MRN: [id number] DOB:06-25-1976, 48 y.o., male Today's Date: 05/03/2024  END OF SESSION:  PT End of Session - 05/03/24 0852     Visit Number 5    Number of Visits 16    Date for PT Re-Evaluation 06/28/24    Authorization Type workers comp    PT Start Time [id number]    PT Stop Time 0929    PT Time Calculation (min) 42 min    Activity Tolerance Patient tolerated treatment well    Behavior During Therapy [facility] for tasks assessed/performed              Past Medical History:  Diagnosis Date   Anal fissure 03/05/2016   Hypertension    states under control with med., has been on med. x 2 yr.   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   Past Surgical History:  Procedure Laterality Date   MENISCUS REPAIR Left 03/22/2022   SHOULDER ARTHROSCOPY WITH DISTAL CLAVICLE RESECTION Right 02/09/2024   Procedure: SHOULDER ARTHROSCOPY WITH DISTAL CLAVICLE RESECTION;  Surgeon: Janeth Medicus, MD;  Location: [facility];  Service: Orthopedics;  Laterality: Right;   SHOULDER ARTHROSCOPY WITH SUBACROMIAL DECOMPRESSION Right 02/09/2024   Procedure: SHOULDER ARTHROSCOPY WITH SUBACROMIAL DECOMPRESSION;  Surgeon: Janeth Medicus, MD;  Location: [facility];  Service: Orthopedics;  Laterality: Right;   SPHINCTEROTOMY N/A 04/07/2016   Procedure: LATERAL INTERNAL SPHINCTEROTOMY;  Surgeon: Jacolyn Matar, MD;  Location: [facility];  Service: General;  Laterality: N/A;   UMBILICAL HERNIA REPAIR  12/05/2010   Patient Active Problem List   Diagnosis Date Noted   Metabolic dysfunction-associated steatohepatitis (MASH) 02/27/2024   Prediabetes 02/27/2024   OSA (obstructive sleep apnea) - possibly severe, on CPAP 02/27/2024   Primary hypertension 02/27/2024   Class 3 severe obesity with serious comorbidity and body  mass index (BMI) of 40.0 to 44.9 in adult 02/27/2024   Decreased GFR 02/27/2024   Hx of rectal sphincterotomy May 2017 04/07/2016   Paresthesia 04/29/2014    PCP: Jalaine Mayo FNP  REFERRING PROVIDER: Mort Ards, MD   REFERRING DIAG: other low back pain  Rationale for Evaluation and Treatment: Rehabilitation  THERAPY DIAG:  Other low back pain  Muscle weakness (generalized)  Other abnormalities of gait and mobility  ONSET DATE: 08/14/23  SUBJECTIVE:  SUBJECTIVE STATEMENT: Pt reports decreased frequency of radicular pain into le.  "Right only occasionally at night, left spotting during day"   Initial Subjective Fall 9/9 fell into hole at work.  Had multiple injuries including right wrist fx.  Hurt back both shoulders, both knees, hips and back.  Had spinal injection in March did not relieve pain. May 22 another spine injection scheduled. June 2nd arthroscopic left knee surgery for meniscus repair.  Had complex tear right knee with surgical intervention x 3 weeks ago. Left shoulder surgery 3/7 (see above for details). I am hoping to get my back stronger.  [facility] my [facility] and knees and coming here for my back for the water.  I did not tolerate (land) therapy for my back.  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  PERTINENT HISTORY:  Neuropathic leg pain right> left. . . .primarily the S1 dermatome. Selective nerve root block S1 02/21/24  02/09/2024 Surgery SHOULDER ARTHROSCOPY WITH SUBACROMIAL DECOMPRESSION   PAIN:  Are you having pain? Yes: NPRS scale: current 4/10; worst 8/10; least 5/10 Pain location: Small of back with radiation into LE to feet Pain description: lightening shooting into legs R>L Aggravating factors: bending; movement, waking >30  minutes Relieving factors: lying with feet elevated/reclines, meds  PRECAUTIONS: None  RED FLAGS: None   WEIGHT BEARING RESTRICTIONS: No  FALLS:  Has patient fallen in last 6 months? Yes. Number of falls 1 into hole  LIVING ENVIRONMENT: Lives with: lives with their family Lives in: House/apartment Stairs: Yes: External: 16 steps; on right going up Has following equipment at home: None  OCCUPATION: truck driver  PLOF: Independent  PATIENT GOALS: decrease pain strengthening up the back  NEXT MD VISIT: next week  OBJECTIVE:  Note: Objective measures were completed at Evaluation unless otherwise noted.  DIAGNOSTIC FINDINGS:  The MRI did show a central disc protrusion with left greater than right S1 nerve irritation.  Bone spur L5   PATIENT SURVEYS:  Modified Oswestry 33/50= 66%   COGNITION: Overall cognitive status: Within functional limits for tasks assessed       MUSCLE LENGTH: Hamstrings: Right 40 deg; Left 60 deg   POSTURE: rounded shoulders and forward head  PALPATION: Moderate TTP throughout midthoracic and lumbar spine  LUMBAR ROM:   AROM eval  Flexion FT to mid calf  Extension 50% limited P!  Right lateral flexion Full P!  Left lateral flexion full  Right rotation   Left rotation    (Blank rows = not tested)    LOWER EXTREMITY testing:    HD (lbs) Tested in sitting Right eval Left eval  Hip flexion 30.6 39.9  Hip extension    Hip abduction 23.9 12.4  Hip adduction    Hip internal rotation    Hip external rotation    Knee flexion    Knee extension 27.5 21.5  Ankle dorsiflexion    Ankle plantarflexion    Ankle inversion    Ankle eversion     (Blank rows = not tested)  LUMBAR SPECIAL TESTS:  Straight leg raise test: Positive and Slump test: Positive  FUNCTIONAL TESTS:  Timed up and go (TUG): 19.61 4 stage balance: passed 1&2.  Tandem and SLS unable to complete  GAIT: Distance walked: 400 ft Assistive device utilized:  None Level of assistance: Complete Independence Comments: Guarded core, decreased arm swing, slowed cadence  TREATMENT  [facility] Adult PT Treatment:  DATE: 05/03/24 Pt seen for aquatic therapy today.  Treatment took place in water 3.5-4.75 ft in depth at the [facility] pool. Temp of water was 91.  Pt entered/exited the pool via stairs with hand rail.   *walking forward, back and side stepping in 4.0 ft *Farmers carry  marching yellow->blue HB side stepping, forward and back, bilaterally then unilaterally *ue support blue HB: toe raises; heel raises; inversion/supination (increased range) *As above walking forward then back in each position *Bilat blue HB pull down in 4.3 ft in wide stance then staggered stances x 10.Good control/execution with heavier resistance *Oblique sets using blue HB x 5, 5 sec hold *hip hinge 2 x 10 reps. Good execution *side lunge ue add/abd yellow HB *forward hip knee kicks x 2 width *cycling on 1 yellow noodle straddled x 4 widths ue support yellow HB; ue support corner wall hip add/abd *walking between exercises for recovery   Pt requires the buoyancy and hydrostatic pressure of water for support, and to offload joints by unweighting joint load by at least 50 % in navel deep water and by at least 75-80% in chest to neck deep water.  Viscosity of the water is needed for resistance of strengthening. Water current perturbations provides challenge to standing balance requiring increased core activation.                                                                                                                               PATIENT EDUCATION:  Education details: Discussed eval findings, rehab rationale, aquatic program progression/POC and pools in area. Patient is in agreement  Person educated: Patient Education method: Explanation Education comprehension: verbalized understanding  HOME EXERCISE  PROGRAM: Land based HEP being followed as [facility] by [facility] for [facility] and [facility] Aquatic to be instructed  ASSESSMENT:  CLINICAL IMPRESSION: Continued reduction in LB and radicular pain.  Pain is centralizing towards LB.  He continues with land based intervention for knee and shoulder.  He is scheduled for meniscus repair in Left knee on Monday. Will be 2 week delay in returning to aquatics for continued LB rehab.  He tolerates increased resistance with core strengthening (yellow- blue HB). Enters and exits pool today without difficulty using alternating pattern. He does report a "pop" after last session in his  upper thoracic spine which relieved overall body pain.         Initial Impression Patient is a 48 y.o. m who was seen today for physical therapy evaluation and treatment for lbp. He had a fall back in sept 2025 sustaining injuries to bilateral shoulder, hips; knees and back.  He has already undergone surgical repairs to left shoulder and right knee with scheduled procedures for June 2 (left knee) and spinal injections May 22.  He is presently being seen by another PT group for "land based" intervention and will continue as prescribed by MD's.  He has not been successful with land based PT for  back pain/dysfunction which is why he presents here today for aquatic therapy intervention. Pain is moderately sensitive with radicular pattern into bilat le. Testing indicates he has bilateral le and core weakness, balance deficits, unmanaged LBP and gait deficits.  All are effecting his functional mobility and ADL's.  He has not returned to work since fall.  He is a good candidate for aquatic intervention and will benefit from the properties of water to progress towards functional goals set.     OBJECTIVE IMPAIRMENTS: Abnormal gait, decreased activity tolerance, decreased balance, decreased endurance, decreased mobility, difficulty walking, decreased strength, and pain.   ACTIVITY  LIMITATIONS: carrying, lifting, bending, sitting, standing, squatting, sleeping, stairs, transfers, and locomotion level  PARTICIPATION LIMITATIONS: meal prep, cleaning, laundry, shopping, community activity, occupation, and yard work  PERSONAL FACTORS: Multiple orthopedic issues being delt with at same time are also affecting patient's functional outcome.   REHAB POTENTIAL: Good  CLINICAL DECISION MAKING: Evolving/moderate complexity  EVALUATION COMPLEXITY: Moderate   GOALS: Goals reviewed with patient? Yes  SHORT TERM GOALS: Target date: 05/05/24  Pt will tolerate full aquatic sessions consistently without increase in pain and with improving function to demonstrate good toleration and effectiveness of intervention.  Baseline: Goal status: Met 05/01/24  2.  Pt will reports reduction of pain with aquatic intervention by 25% Baseline: Pt reports overall reduction in LB and le Goal status: Met 05/03/24  3.  Pt will complete tandem and SLS with light ue support in 3.6 ft holding x 20 s Baseline:  Goal status: INITIAL  4.  Pt will complete STS from 3rd water step x 10 reps without LOB or limitation to pain ue unsupported Baseline:  Goal status: INITIAL  5.  Pt will climb 7 steps in and out of pool using a combination of step to and alternating pattern demonstraing improving LE and core strength Baseline:  Goal status: Met 05/03/24    LONG TERM GOALS: Target date: 06/28/24  Pt to improve on ODI by at least 13-15% (MCID) to demonstrate statistically significant Improvement in function. Baseline: Modified Oswestry 33/50= 66%  Goal status: INITIAL  2.  Pt will be indep with final HEP's (land and aquatic as appropriate) for continued management of condition Baseline:  Goal status: INITIAL  3.  Pt will report decrease in pain by at least 50% for improved toleration to activity/quality of life and to demonstrate improved management of pain. Baseline: see chart Goal status:  INITIAL  4.  Pt will report reduced episode of waking at night due to pain Baseline: 2-3 Goal status: INITIAL  5.  Pt will improve strength in all areas listed by at least 10 lbs to demonstrate improved overall physical function Baseline:  Goal status: INITIAL  6.  Pt will report no limitation from LBP with walking Baseline:  Goal status: INITIAL  PLAN:  PT FREQUENCY: 2x/week  PT DURATION: 10 weeks:  allowing 2 weeks out for surgical procedure June 2  PLANNED INTERVENTIONS: 53664- PT Re-evaluation, 97110-Therapeutic exercises, 97530- Therapeutic activity, [phone]- Neuromuscular re-education, [phone]- Self Care, 63875- Manual therapy, [phone]- Gait training, [phone]- Aquatic Therapy, [phone]- Electrical stimulation (unattended), [phone]- Ionotophoresis 4mg /ml Dexamethasone , Patient/Family education, Balance training, Stair training, Taping, Dry Needling, Joint mobilization, DME instructions, Cryotherapy, and Moist heat.  PLAN FOR NEXT SESSION: Aquatic only: core strengthening; LE (hips particularly) strengthening; lumbar/thoracic stretching; pain management; balance retraining   Adriana Hopping Laneta Pintos) Dasie Chancellor MPT 05/03/24 8:55 AM [facility] [address] [place], [place], [address] Phone: [phone]  Fax:  [phone]

## 2024-05-06 ENCOUNTER — Encounter (INDEPENDENT_AMBULATORY_CARE_PROVIDER_SITE_OTHER): Admitting: Internal Medicine

## 2024-05-06 NOTE — Patient Instructions (Signed)
 err

## 2024-05-06 NOTE — Progress Notes (Signed)
 This encounter was created in error - please disregard.

## 2024-05-22 ENCOUNTER — Ambulatory Visit (INDEPENDENT_AMBULATORY_CARE_PROVIDER_SITE_OTHER): Admitting: Internal Medicine

## 2024-05-22 ENCOUNTER — Ambulatory Visit (HOSPITAL_BASED_OUTPATIENT_CLINIC_OR_DEPARTMENT_OTHER): Payer: Worker's Compensation | Attending: Orthopedic Surgery | Admitting: Physical Therapy

## 2024-05-22 ENCOUNTER — Encounter (HOSPITAL_BASED_OUTPATIENT_CLINIC_OR_DEPARTMENT_OTHER): Payer: Self-pay | Admitting: Physical Therapy

## 2024-05-22 DIAGNOSIS — R2689 Other abnormalities of gait and mobility: Secondary | ICD-10-CM | POA: Insufficient documentation

## 2024-05-22 DIAGNOSIS — M5459 Other low back pain: Secondary | ICD-10-CM | POA: Insufficient documentation

## 2024-05-22 DIAGNOSIS — M6281 Muscle weakness (generalized): Secondary | ICD-10-CM | POA: Insufficient documentation

## 2024-05-22 NOTE — Therapy (Signed)
 OUTPATIENT PHYSICAL THERAPY THORACOLUMBAR EVALUATION   Patient Name: Shane Erickson MRN: [id number] DOB:07-Jun-1976, 48 y.o., male Today's Date: 05/22/2024  END OF SESSION:  PT End of Session - 05/22/24 1536     Visit Number 6    Number of Visits 16    Date for PT Re-Evaluation 06/28/24    Authorization Type workers comp    PT Start Time 1532    PT Stop Time 1612    PT Time Calculation (min) 40 min    Activity Tolerance Patient tolerated treatment well    Behavior During Therapy WFL for tasks assessed/performed           Past Medical History:  Diagnosis Date   Anal fissure 03/05/2016   Hypertension    states under control with med., has been on med. x 2 yr.   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   Past Surgical History:  Procedure Laterality Date   MENISCUS REPAIR Left 03/22/2022   SHOULDER ARTHROSCOPY WITH DISTAL CLAVICLE RESECTION Right 02/09/2024   Procedure: SHOULDER ARTHROSCOPY WITH DISTAL CLAVICLE RESECTION;  Surgeon: Janeth Medicus, MD;  Location: [facility];  Service: Orthopedics;  Laterality: Right;   SHOULDER ARTHROSCOPY WITH SUBACROMIAL DECOMPRESSION Right 02/09/2024   Procedure: SHOULDER ARTHROSCOPY WITH SUBACROMIAL DECOMPRESSION;  Surgeon: Janeth Medicus, MD;  Location: [facility];  Service: Orthopedics;  Laterality: Right;   SPHINCTEROTOMY N/A 04/07/2016   Procedure: LATERAL INTERNAL SPHINCTEROTOMY;  Surgeon: Jacolyn Matar, MD;  Location: [facility];  Service: General;  Laterality: N/A;   UMBILICAL HERNIA REPAIR  12/05/2010   Patient Active Problem List   Diagnosis Date Noted   Metabolic dysfunction-associated steatohepatitis (MASH) 02/27/2024   Prediabetes 02/27/2024   OSA (obstructive sleep apnea) - possibly severe, on CPAP 02/27/2024   Primary hypertension 02/27/2024   Class 3 severe obesity with serious comorbidity and body mass  index (BMI) of 40.0 to 44.9 in adult 02/27/2024   Decreased GFR 02/27/2024   Hx of rectal sphincterotomy May 2017 04/07/2016   Paresthesia 04/29/2014    PCP: Jalaine Mayo FNP  REFERRING PROVIDER: Mort Ards, MD   REFERRING DIAG: other low back pain  Rationale for Evaluation and Treatment: Rehabilitation  THERAPY DIAG:  Other low back pain  Muscle weakness (generalized)  Other abnormalities of gait and mobility  ONSET DATE: 08/14/23  SUBJECTIVE:  SUBJECTIVE STATEMENT: Pt reports return of LBP with radicular pattern into bilat LE. Had returned to MD in regards and he has recommended more injections.  Had left knee surgery 2 weeks ago with good results.  Minor pain 0-4/10   Initial Subjective Fall 9/9 fell into hole at work.  Had multiple injuries including right wrist fx.  Hurt back both shoulders, both knees, hips and back.  Had spinal injection in March did not relieve pain. May 22 another spine injection scheduled. June 2nd arthroscopic left knee surgery for meniscus repair.  Had complex tear right knee with surgical intervention x 3 weeks ago. Left shoulder surgery 3/7 (see above for details). I am hoping to get my back stronger.  [facility] my [facility] and knees and coming here for my back for the water.  I did not tolerate (land) therapy for my back.  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  PERTINENT HISTORY:  Neuropathic leg pain right> left. . . .primarily the S1 dermatome. Selective nerve root block S1 02/21/24  02/09/2024 Surgery SHOULDER ARTHROSCOPY WITH SUBACROMIAL DECOMPRESSION   PAIN:  Are you having pain? Yes: NPRS scale: current 8/10; worst 8/10; least 5/10 Pain location: Small of back with radiation into LE to feet Pain description: lightening shooting  into legs R>L Aggravating factors: bending; movement, waking >30 minutes Relieving factors: lying with feet elevated/reclines, meds  PRECAUTIONS: None  RED FLAGS: None   WEIGHT BEARING RESTRICTIONS: No  FALLS:  Has patient fallen in last 6 months? Yes. Number of falls 1 into hole  LIVING ENVIRONMENT: Lives with: lives with their family Lives in: House/apartment Stairs: Yes: External: 16 steps; on right going up Has following equipment at home: None  OCCUPATION: truck driver  PLOF: Independent  PATIENT GOALS: decrease pain strengthening up the back  NEXT MD VISIT: next week  OBJECTIVE:  Note: Objective measures were completed at Evaluation unless otherwise noted.  DIAGNOSTIC FINDINGS:  The MRI did show a central disc protrusion with left greater than right S1 nerve irritation.  Bone spur L5   PATIENT SURVEYS:  Modified Oswestry 33/50= 66%   COGNITION: Overall cognitive status: Within functional limits for tasks assessed       MUSCLE LENGTH: Hamstrings: Right 40 deg; Left 60 deg   POSTURE: rounded shoulders and forward head  PALPATION: Moderate TTP throughout midthoracic and lumbar spine  LUMBAR ROM:   AROM eval  Flexion FT to mid calf  Extension 50% limited P!  Right lateral flexion Full P!  Left lateral flexion full  Right rotation   Left rotation    (Blank rows = not tested)    LOWER EXTREMITY testing:    HD (lbs) Tested in sitting Right eval Left eval  Hip flexion 30.6 39.9  Hip extension    Hip abduction 23.9 12.4  Hip adduction    Hip internal rotation    Hip external rotation    Knee flexion    Knee extension 27.5 21.5  Ankle dorsiflexion    Ankle plantarflexion    Ankle inversion    Ankle eversion     (Blank rows = not tested)  LUMBAR SPECIAL TESTS:  Straight leg raise test: Positive and Slump test: Positive  FUNCTIONAL TESTS:  Timed up and go (TUG): 19.61 4 stage balance: passed 1&2.  Tandem and SLS unable to  complete  GAIT: Distance walked: 400 ft Assistive device utilized: None Level of assistance: Complete Independence Comments: Guarded core, decreased arm swing, slowed  cadence  TREATMENT  [facility] Adult PT Treatment:                                                DATE: 05/03/24 Pt seen for aquatic therapy today.  Treatment took place in water 3.5-4.75 ft in depth at the [facility] pool. Temp of water was 91.  Pt entered/exited the pool via stairs with hand rail.   *walking forward, back and side stepping in 4.0 ft *side stepping ue add/abd blue HB->slight lunge  *hip hinge 2 x 10 reps. Good execution *Farmers carry  marching forward and back, using blue HB bilaterally then unilaterally *Bilat blue HB pull down in 4.3 ft in wide stance then staggered stances x 10.  *ue support blue HB: toe raises; heel raises; inversion/supination (increased range) *As above walking forward then back in each position  *Oblique sets using blue HB x 5, 5 sec hold   *forward hip knee kicks x 2 width *cycling on 1 yellow noodle straddled x 4 widths ue support yellow HB; ue support corner wall hip add/abd *walking between exercises for recovery   Pt requires the buoyancy and hydrostatic pressure of water for support, and to offload joints by unweighting joint load by at least 50 % in navel deep water and by at least 75-80% in chest to neck deep water.  Viscosity of the water is needed for resistance of strengthening. Water current perturbations provides challenge to standing balance requiring increased core activation.                                                                                                                               PATIENT EDUCATION:  Education details: Discussed eval findings, rehab rationale, aquatic program progression/POC and pools in area. Patient is in agreement  Person educated: Patient Education method: Explanation Education comprehension: verbalized  understanding  HOME EXERCISE PROGRAM: Land based HEP being followed as instructed by [facility] for [facility] and shoulders Aquatic to be instructed  ASSESSMENT:  CLINICAL IMPRESSION: LBP has returned.  Knee surgery went well.  Saw Dr Vaughn Georges who suggested more injections although pt wants to wait hoping aquatic therapy will control. Directed pt through core engagement mindful of left knee.  He requires a few recovery periods. Dialed back session for toleration purposes which he does tolerate well.  Some reduction of LBP by end of session by 2 NPRS.  He continues to be a good candidate for aquatic therapy intervention will benefit from the properties of water to progress towards land based goals.     Initial Impression Patient is a 48 y.o. m who was seen today for physical therapy evaluation and treatment for lbp. He had a fall back in sept 2025 sustaining injuries to bilateral shoulder, hips; knees and back.  He has already undergone  surgical repairs to left shoulder and right knee with scheduled procedures for June 2 (left knee) and spinal injections May 22.  He is presently being seen by another PT group for land based intervention and will continue as prescribed by MD's.  He has not been successful with land based PT for back pain/dysfunction which is why he presents here today for aquatic therapy intervention. Pain is moderately sensitive with radicular pattern into bilat le. Testing indicates he has bilateral le and core weakness, balance deficits, unmanaged LBP and gait deficits.  All are effecting his functional mobility and ADL's.  He has not returned to work since fall.  He is a good candidate for aquatic intervention and will benefit from the properties of water to progress towards functional goals set.     OBJECTIVE IMPAIRMENTS: Abnormal gait, decreased activity tolerance, decreased balance, decreased endurance, decreased mobility, difficulty walking, decreased strength, and pain.    ACTIVITY LIMITATIONS: carrying, lifting, bending, sitting, standing, squatting, sleeping, stairs, transfers, and locomotion level  PARTICIPATION LIMITATIONS: meal prep, cleaning, laundry, shopping, community activity, occupation, and yard work  PERSONAL FACTORS: Multiple orthopedic issues being delt with at same time are also affecting patient's functional outcome.   REHAB POTENTIAL: Good  CLINICAL DECISION MAKING: Evolving/moderate complexity  EVALUATION COMPLEXITY: Moderate   GOALS: Goals reviewed with patient? Yes  SHORT TERM GOALS: Target date: 05/05/24  Pt will tolerate full aquatic sessions consistently without increase in pain and with improving function to demonstrate good toleration and effectiveness of intervention.  Baseline: Goal status: Met 05/01/24  2.  Pt will reports reduction of pain with aquatic intervention by 25% Baseline: Pt reports overall reduction in LB and le Goal status: Met 05/03/24  3.  Pt will complete tandem and SLS with light ue support in 3.6 ft holding x 20 s Baseline:  Goal status: INITIAL  4.  Pt will complete STS from 3rd water step x 10 reps without LOB or limitation to pain ue unsupported Baseline:  Goal status: INITIAL  5.  Pt will climb 7 steps in and out of pool using a combination of step to and alternating pattern demonstraing improving LE and core strength Baseline:  Goal status: Met 05/03/24    LONG TERM GOALS: Target date: 06/28/24  Pt to improve on ODI by at least 13-15% (MCID) to demonstrate statistically significant Improvement in function. Baseline: Modified Oswestry 33/50= 66%  Goal status: INITIAL  2.  Pt will be indep with final HEP's (land and aquatic as appropriate) for continued management of condition Baseline:  Goal status: INITIAL  3.  Pt will report decrease in pain by at least 50% for improved toleration to activity/quality of life and to demonstrate improved management of pain. Baseline: see chart Goal  status: INITIAL  4.  Pt will report reduced episode of waking at night due to pain Baseline: 2-3 Goal status: INITIAL  5.  Pt will improve strength in all areas listed by at least 10 lbs to demonstrate improved overall physical function Baseline:  Goal status: INITIAL  6.  Pt will report no limitation from LBP with walking Baseline:  Goal status: INITIAL  PLAN:  PT FREQUENCY: 2x/week  PT DURATION: 10 weeks:  allowing 2 weeks out for surgical procedure June 2  PLANNED INTERVENTIONS: 44010- PT Re-evaluation, 97110-Therapeutic exercises, 97530- Therapeutic activity, 97112- Neuromuscular re-education, [phone]- Self Care, 66440- Manual therapy, [phone]- Gait training, [phone]- Aquatic Therapy, [phone]- Electrical stimulation (unattended), [phone]- Ionotophoresis 4mg /ml Dexamethasone , Patient/Family education, Balance training, Stair training, Taping, Dry  Needling, Joint mobilization, DME instructions, Cryotherapy, and Moist heat.  PLAN FOR NEXT SESSION: Aquatic only: core strengthening; LE (hips particularly) strengthening; lumbar/thoracic stretching; pain management; balance retraining   Adriana Hopping Laneta Pintos) Rhylen Shaheen MPT 05/22/24 3:38 PM [facility] [address] [place], [place], [address] Phone: [phone]   Fax:  [phone]

## 2024-05-24 ENCOUNTER — Ambulatory Visit (HOSPITAL_BASED_OUTPATIENT_CLINIC_OR_DEPARTMENT_OTHER): Payer: Worker's Compensation | Admitting: Physical Therapy

## 2024-05-24 ENCOUNTER — Encounter (HOSPITAL_BASED_OUTPATIENT_CLINIC_OR_DEPARTMENT_OTHER): Payer: Self-pay | Admitting: Physical Therapy

## 2024-05-24 DIAGNOSIS — M5459 Other low back pain: Secondary | ICD-10-CM | POA: Diagnosis not present

## 2024-05-24 DIAGNOSIS — M6281 Muscle weakness (generalized): Secondary | ICD-10-CM

## 2024-05-24 DIAGNOSIS — R2689 Other abnormalities of gait and mobility: Secondary | ICD-10-CM

## 2024-05-24 NOTE — Therapy (Signed)
 OUTPATIENT PHYSICAL THERAPY THORACOLUMBAR TREATMENT   Patient Name: Shane Erickson MRN: [id number] DOB:1976-07-30, 48 y.o., male Today's Date: 05/24/2024  END OF SESSION:  PT End of Session - 05/24/24 1444     Visit Number 7    Number of Visits 16    Date for PT Re-Evaluation 06/28/24    Authorization Type workers comp    PT Start Time 1439   pt arrived late to pool area   PT Stop Time 1517    PT Time Calculation (min) 38 min    Activity Tolerance Patient tolerated treatment well    Behavior During Therapy WFL for tasks assessed/performed           Past Medical History:  Diagnosis Date   Anal fissure 03/05/2016   Hypertension    states under control with med., has been on med. x 2 yr.   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   Past Surgical History:  Procedure Laterality Date   MENISCUS REPAIR Left 03/22/2022   SHOULDER ARTHROSCOPY WITH DISTAL CLAVICLE RESECTION Right 02/09/2024   Procedure: SHOULDER ARTHROSCOPY WITH DISTAL CLAVICLE RESECTION;  Surgeon: Janeth Medicus, MD;  Location: [facility];  Service: Orthopedics;  Laterality: Right;   SHOULDER ARTHROSCOPY WITH SUBACROMIAL DECOMPRESSION Right 02/09/2024   Procedure: SHOULDER ARTHROSCOPY WITH SUBACROMIAL DECOMPRESSION;  Surgeon: Janeth Medicus, MD;  Location: [facility];  Service: Orthopedics;  Laterality: Right;   SPHINCTEROTOMY N/A 04/07/2016   Procedure: LATERAL INTERNAL SPHINCTEROTOMY;  Surgeon: Jacolyn Matar, MD;  Location: [facility];  Service: General;  Laterality: N/A;   UMBILICAL HERNIA REPAIR  12/05/2010   Patient Active Problem List   Diagnosis Date Noted   Metabolic dysfunction-associated steatohepatitis (MASH) 02/27/2024   Prediabetes 02/27/2024   OSA (obstructive sleep apnea) - possibly severe, on CPAP 02/27/2024   Primary hypertension 02/27/2024   Class 3 severe obesity with  serious comorbidity and body mass index (BMI) of 40.0 to 44.9 in adult 02/27/2024   Decreased GFR 02/27/2024   Hx of rectal sphincterotomy May 2017 04/07/2016   Paresthesia 04/29/2014    PCP: Jalaine Mayo FNP  REFERRING PROVIDER: Mort Ards, MD   REFERRING DIAG: other low back pain  Rationale for Evaluation and Treatment: Rehabilitation  THERAPY DIAG:  Other low back pain  Muscle weakness (generalized)  Other abnormalities of gait and mobility  ONSET DATE: 08/14/23  SUBJECTIVE:  SUBJECTIVE STATEMENT: Pt reports he is tired.  He states that aquatic therapy has taken a lot pressure off of my back.    Initial Subjective Fall 9/9 fell into hole at work.  Had multiple injuries including right wrist fx.  Hurt back both shoulders, both knees, hips and back.  Had spinal injection in March did not relieve pain. May 22 another spine injection scheduled. June 2nd arthroscopic left knee surgery for meniscus repair.  Had complex tear right knee with surgical intervention x 3 weeks ago. Left shoulder surgery 3/7 (see above for details). I am hoping to get my back stronger.  I am going to [facility] for rehab for my shoulders and knees and coming here for my back for the water.  I did not tolerate (land) therapy for my back.  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  PERTINENT HISTORY:  Neuropathic leg pain right> left. . . .primarily the S1 dermatome. Selective nerve root block S1 02/21/24  02/09/2024 Surgery SHOULDER ARTHROSCOPY WITH SUBACROMIAL DECOMPRESSION   PAIN:  Are you having pain? Yes: NPRS scale: current 8/10; worst 8/10; least 5/10 Pain location: Small of back with radiation into LE to feet Pain description: lightening shooting into legs R>L Aggravating factors: bending; movement, waking >30  minutes Relieving factors: lying with feet elevated/reclines, meds  PRECAUTIONS: None  RED FLAGS: None   WEIGHT BEARING RESTRICTIONS: No  FALLS:  Has patient fallen in last 6 months? Yes. Number of falls 1 into hole  LIVING ENVIRONMENT: Lives with: lives with their family Lives in: House/apartment Stairs: Yes: External: 16 steps; on right going up Has following equipment at home: None  OCCUPATION: truck driver  PLOF: Independent  PATIENT GOALS: decrease pain strengthening up the back  NEXT MD VISIT: next week  OBJECTIVE:  Note: Objective measures were completed at Evaluation unless otherwise noted.  DIAGNOSTIC FINDINGS:  The MRI did show a central disc protrusion with left greater than right S1 nerve irritation.  Bone spur L5   PATIENT SURVEYS:  Modified Oswestry 33/50= 66%   COGNITION: Overall cognitive status: Within functional limits for tasks assessed       MUSCLE LENGTH: Hamstrings: Right 40 deg; Left 60 deg   POSTURE: rounded shoulders and forward head  PALPATION: Moderate TTP throughout midthoracic and lumbar spine  LUMBAR ROM:   AROM eval  Flexion FT to mid calf  Extension 50% limited P!  Right lateral flexion Full P!  Left lateral flexion full  Right rotation   Left rotation    (Blank rows = not tested)    LOWER EXTREMITY testing:    HD (lbs) Tested in sitting Right eval Left eval  Hip flexion 30.6 39.9  Hip extension    Hip abduction 23.9 12.4  Hip adduction    Hip internal rotation    Hip external rotation    Knee flexion    Knee extension 27.5 21.5  Ankle dorsiflexion    Ankle plantarflexion    Ankle inversion    Ankle eversion     (Blank rows = not tested)  LUMBAR SPECIAL TESTS:  Straight leg raise test: Positive and Slump test: Positive  FUNCTIONAL TESTS:  Timed up and go (TUG): 19.61 4 stage balance: passed 1&2.  Tandem and SLS unable to complete  05/24/24:  SLS and tandem stance in 107ft6 for 20sec each  LE  GAIT: Distance walked: 400 ft Assistive device utilized: None Level of assistance: Complete Independence Comments: Guarded core, decreased arm swing, slowed cadence  TREATMENT  [facility] Adult PT Treatment:                                                DATE: 05/24/24 Pt seen for aquatic therapy today.  Treatment took place in water 3.5-4.75 ft in depth at the [facility] pool. Temp of water was 91.  Pt entered/exited the pool via stairs with hand rail.   *walking forward/ backward in 4+ft *side stepping UE addct/abdct with yellow hand floats  *hip hinge with UE on yellow hand floats x 10 reps -> gentle wag the tail *Farmers carry  marching forward and back, using yellow hand floats bilaterally * SLS and tandem stance in 47ft6 20s each * tandem gait forward/ backward with rainbow hand floats (easy) * TrA set with yellow hand float pull down to same/opp side knee taps while marching forward/ backward *forward hip knee kicks x 2 width *cycling on thick square noodle with extra square noodle under arms/posteriorly*walking between exercises for recovery   Pt requires the buoyancy and hydrostatic pressure of water for support, and to offload joints by unweighting joint load by at least 50 % in navel deep water and by at least 75-80% in chest to neck deep water.  Viscosity of the water is needed for resistance of strengthening. Water current perturbations provides challenge to standing balance requiring increased core activation.                                                                                                                               PATIENT EDUCATION:  Education details: intro to aquatic therapy  Person educated: Patient Education method: Explanation Education comprehension: verbalized understanding  HOME EXERCISE PROGRAM: Land based HEP being followed as instructed by [facility] for knees and shoulders Aquatic to be instructed  ASSESSMENT:  CLINICAL  IMPRESSION: Pt reported reduction of back pain by 2 points during session. Dialed back resistance level as he stated that the blue hand floats were too much last visit. Encouraged pt to check out local pool so that he may continue completing aquatic exercises outside of therapy sessions..  He continues to be a good candidate for aquatic therapy intervention will benefit from the properties of water to progress towards land based goals. Pt has met STG3     Initial Impression Patient is a 48 y.o. m who was seen today for physical therapy evaluation and treatment for lbp. He had a fall back in sept 2025 sustaining injuries to bilateral shoulder, hips; knees and back.  He has already undergone surgical repairs to left shoulder and right knee with scheduled procedures for June 2 (left knee) and spinal injections May 22.  He is presently being seen by another PT group for land based intervention and will continue as prescribed by MD's.  He has not been successful  with land based PT for back pain/dysfunction which is why he presents here today for aquatic therapy intervention. Pain is moderately sensitive with radicular pattern into bilat le. Testing indicates he has bilateral le and core weakness, balance deficits, unmanaged LBP and gait deficits.  All are effecting his functional mobility and ADL's.  He has not returned to work since fall.  He is a good candidate for aquatic intervention and will benefit from the properties of water to progress towards functional goals set.     OBJECTIVE IMPAIRMENTS: Abnormal gait, decreased activity tolerance, decreased balance, decreased endurance, decreased mobility, difficulty walking, decreased strength, and pain.   ACTIVITY LIMITATIONS: carrying, lifting, bending, sitting, standing, squatting, sleeping, stairs, transfers, and locomotion level  PARTICIPATION LIMITATIONS: meal prep, cleaning, laundry, shopping, community activity, occupation, and yard work  PERSONAL  FACTORS: Multiple orthopedic issues being delt with at same time are also affecting patient's functional outcome.   REHAB POTENTIAL: Good  CLINICAL DECISION MAKING: Evolving/moderate complexity  EVALUATION COMPLEXITY: Moderate   GOALS: Goals reviewed with patient? Yes  SHORT TERM GOALS: Target date: 05/05/24  Pt will tolerate full aquatic sessions consistently without increase in pain and with improving function to demonstrate good toleration and effectiveness of intervention.  Baseline: Goal status: Met 05/01/24  2.  Pt will reports reduction of pain with aquatic intervention by 25% Baseline: Pt reports overall reduction in LB and le Goal status: Met 05/03/24  3.  Pt will complete tandem and SLS with light ue support in 3.6 ft holding x 20 s Baseline:  Goal status: MET- 05/24/24  4.  Pt will complete STS from 3rd water step x 10 reps without LOB or limitation to pain ue unsupported Baseline:  Goal status: Partially met - 05/24/24  5.  Pt will climb 7 steps in and out of pool using a combination of step to and alternating pattern demonstraing improving LE and core strength Baseline:  Goal status: Met 05/03/24    LONG TERM GOALS: Target date: 06/28/24  Pt to improve on ODI by at least 13-15% (MCID) to demonstrate statistically significant Improvement in function. Baseline: Modified Oswestry 33/50= 66%  Goal status: INITIAL  2.  Pt will be indep with final HEP's (land and aquatic as appropriate) for continued management of condition Baseline:  Goal status: INITIAL  3.  Pt will report decrease in pain by at least 50% for improved toleration to activity/quality of life and to demonstrate improved management of pain. Baseline: see chart Goal status: INITIAL  4.  Pt will report reduced episode of waking at night due to pain Baseline: 2-3 Goal status: INITIAL  5.  Pt will improve strength in all areas listed by at least 10 lbs to demonstrate improved overall physical  function Baseline:  Goal status: INITIAL  6.  Pt will report no limitation from LBP with walking Baseline:  Goal status: INITIAL  PLAN:  PT FREQUENCY: 2x/week  PT DURATION: 10 weeks:  allowing 2 weeks out for surgical procedure June 2  PLANNED INTERVENTIONS: 40981- PT Re-evaluation, 97110-Therapeutic exercises, 97530- Therapeutic activity, [phone]- Neuromuscular re-education, [phone]- Self Care, 21308- Manual therapy, [phone]- Gait training, [phone]- Aquatic Therapy, [phone]- Electrical stimulation (unattended), [phone]- Ionotophoresis 4mg /ml Dexamethasone , Patient/Family education, Balance training, Stair training, Taping, Dry Needling, Joint mobilization, DME instructions, Cryotherapy, and Moist heat.  PLAN FOR NEXT SESSION: Aquatic only: core strengthening; LE (hips particularly) strengthening; lumbar/thoracic stretching; pain management; balance retraining  Almedia Jacobsen, PTA 05/24/24 5:14 PM [facility] [phone]  [address]  [place], [place], [address] Phone: [phone]   Fax:  [phone]

## 2024-05-29 ENCOUNTER — Ambulatory Visit (HOSPITAL_BASED_OUTPATIENT_CLINIC_OR_DEPARTMENT_OTHER): Payer: Worker's Compensation | Admitting: Physical Therapy

## 2024-05-29 ENCOUNTER — Encounter (HOSPITAL_BASED_OUTPATIENT_CLINIC_OR_DEPARTMENT_OTHER): Payer: Self-pay | Admitting: Physical Therapy

## 2024-05-29 DIAGNOSIS — M5459 Other low back pain: Secondary | ICD-10-CM | POA: Diagnosis not present

## 2024-05-29 DIAGNOSIS — M6281 Muscle weakness (generalized): Secondary | ICD-10-CM

## 2024-05-29 DIAGNOSIS — R2689 Other abnormalities of gait and mobility: Secondary | ICD-10-CM

## 2024-05-29 NOTE — Therapy (Signed)
 OUTPATIENT PHYSICAL THERAPY THORACOLUMBAR TREATMENT   Patient Name: Shane Erickson MRN: [id number] DOB:1976/07/20, 48 y.o., male Today's Date: 05/29/2024  END OF SESSION:  PT End of Session - 05/29/24 1456     Visit Number 8    Number of Visits 16    Date for PT Re-Evaluation 06/28/24    Authorization Type workers comp    PT Start Time 1448    PT Stop Time 1530    PT Time Calculation (min) 42 min    Activity Tolerance Patient tolerated treatment well    Behavior During Therapy WFL for tasks assessed/performed           Past Medical History:  Diagnosis Date   Anal fissure 03/05/2016   Hypertension    states under control with med., has been on med. x 2 yr.   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   Past Surgical History:  Procedure Laterality Date   MENISCUS REPAIR Left 03/22/2022   SHOULDER ARTHROSCOPY WITH DISTAL CLAVICLE RESECTION Right 02/09/2024   Procedure: SHOULDER ARTHROSCOPY WITH DISTAL CLAVICLE RESECTION;  Surgeon: Sharl Selinda Dover, MD;  Location: [facility];  Service: Orthopedics;  Laterality: Right;   SHOULDER ARTHROSCOPY WITH SUBACROMIAL DECOMPRESSION Right 02/09/2024   Procedure: SHOULDER ARTHROSCOPY WITH SUBACROMIAL DECOMPRESSION;  Surgeon: Sharl Selinda Dover, MD;  Location: [facility];  Service: Orthopedics;  Laterality: Right;   SPHINCTEROTOMY N/A 04/07/2016   Procedure: LATERAL INTERNAL SPHINCTEROTOMY;  Surgeon: Donnice Lunger, MD;  Location: [facility];  Service: General;  Laterality: N/A;   UMBILICAL HERNIA REPAIR  12/05/2010   Patient Active Problem List   Diagnosis Date Noted   Metabolic dysfunction-associated steatohepatitis (MASH) 02/27/2024   Prediabetes 02/27/2024   OSA (obstructive sleep apnea) - possibly severe, on CPAP 02/27/2024   Primary hypertension 02/27/2024   Class 3 severe obesity with serious comorbidity and body mass  index (BMI) of 40.0 to 44.9 in adult 02/27/2024   Decreased GFR 02/27/2024   Hx of rectal sphincterotomy May 2017 04/07/2016   Paresthesia 04/29/2014    PCP: Nell Piety FNP  REFERRING PROVIDER: Burnetta Aures, MD   REFERRING DIAG: other low back pain  Rationale for Evaluation and Treatment: Rehabilitation  THERAPY DIAG:  Other low back pain  Muscle weakness (generalized)  Other abnormalities of gait and mobility  ONSET DATE: 08/14/23  SUBJECTIVE:  SUBJECTIVE STATEMENT: Pt continues to feel tightness in back as day progresses more so since most recent surgery .   Initial Subjective Fall 9/9 fell into hole at work.  Had multiple injuries including right wrist fx.  Hurt back both shoulders, both knees, hips and back.  Had spinal injection in March did not relieve pain. May 22 another spine injection scheduled. June 2nd arthroscopic left knee surgery for meniscus repair.  Had complex tear right knee with surgical intervention x 3 weeks ago. Left shoulder surgery 3/7 (see above for details). I am hoping to get my back stronger.  I am going [facility] for my shoulders and knees and coming here for my back for the water.  I did not tolerate (land) therapy for my back.  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  PERTINENT HISTORY:  Neuropathic leg pain right> left. . . .primarily the S1 dermatome. Selective nerve root block S1 02/21/24  02/09/2024 Surgery SHOULDER ARTHROSCOPY WITH SUBACROMIAL DECOMPRESSION   PAIN:  Are you having pain? Yes: NPRS scale: current 4/10; worst 8/10; least 5/10 Pain location: Small of back with radiation into LE to feet Pain description: lightening shooting into legs R>L Aggravating factors: bending; movement, waking >30 minutes Relieving factors: lying with feet  elevated/reclines, meds  PRECAUTIONS: None  RED FLAGS: None   WEIGHT BEARING RESTRICTIONS: No  FALLS:  Has patient fallen in last 6 months? Yes. Number of falls 1 into hole  LIVING ENVIRONMENT: Lives with: lives with their family Lives in: House/apartment Stairs: Yes: External: 16 steps; on right going up Has following equipment at home: None  OCCUPATION: truck driver  PLOF: Independent  PATIENT GOALS: decrease pain strengthening up the back  NEXT MD VISIT: next week  OBJECTIVE:  Note: Objective measures were completed at Evaluation unless otherwise noted.  DIAGNOSTIC FINDINGS:  The MRI did show a central disc protrusion with left greater than right S1 nerve irritation.  Bone spur L5   PATIENT SURVEYS:  Modified Oswestry 33/50= 66%   COGNITION: Overall cognitive status: Within functional limits for tasks assessed       MUSCLE LENGTH: Hamstrings: Right 40 deg; Left 60 deg   POSTURE: rounded shoulders and forward head  PALPATION: Moderate TTP throughout midthoracic and lumbar spine  LUMBAR ROM:   AROM eval  Flexion FT to mid calf  Extension 50% limited P!  Right lateral flexion Full P!  Left lateral flexion full  Right rotation   Left rotation    (Blank rows = not tested)    LOWER EXTREMITY testing:    HD (lbs) Tested in sitting Right eval Left eval  Hip flexion 30.6 39.9  Hip extension    Hip abduction 23.9 12.4  Hip adduction    Hip internal rotation    Hip external rotation    Knee flexion    Knee extension 27.5 21.5  Ankle dorsiflexion    Ankle plantarflexion    Ankle inversion    Ankle eversion     (Blank rows = not tested)  LUMBAR SPECIAL TESTS:  Straight leg raise test: Positive and Slump test: Positive  FUNCTIONAL TESTS:  Timed up and go (TUG): 19.61 4 stage balance: passed 1&2.  Tandem and SLS unable to complete  05/24/24:  SLS and tandem stance in 60ft6 for 20sec each LE  GAIT: Distance walked: 400 ft Assistive  device utilized: None Level of assistance: Complete Independence Comments: Guarded core, decreased arm swing, slowed cadence  TREATMENT  [facility] Adult PT Treatment:  DATE: 05/29/24 Pt seen for aquatic therapy today.  Treatment took place in water 3.5-4.75 ft in depth at the [facility] pool. Temp of water was 91.  Pt entered/exited the pool via stairs with hand rail.   *walking forward/ backward in 4+ft *side stepping UE addct/abdct with blue hand floats -> side lunge *L stretch *Farmers carry  marching forward and back, using blue hand floats bilaterally * TrA set with yellow hand float pull down wide stance then staggered *tandem stance 4.0 ue add/abd yellow HB x 10 leading R/L *SLS 4.0 ft ue add/abd x 10 R/L *solid noodle->yellow noodle stomp R and L LE ue support yellow HB hip in neutral then external rotation.  Cues for mindful control with lle *Plank on bench then using yellow noodle->blue HB.  Pt with difficulty gaining position.  Able to get 5 arm lifts with good core engagement   Pt requires the buoyancy and hydrostatic pressure of water for support, and to offload joints by unweighting joint load by at least 50 % in navel deep water and by at least 75-80% in chest to neck deep water.  Viscosity of the water is needed for resistance of strengthening. Water current perturbations provides challenge to standing balance requiring increased core activation.                                                                                                                               PATIENT EDUCATION:  Education details: intro to aquatic therapy  Person educated: Patient Education method: Explanation Education comprehension: verbalized understanding  HOME EXERCISE PROGRAM: Land based HEP being followed as instructed by [facility] for [facility] and shoulders Aquatic to be instructed  ASSESSMENT:  CLINICAL IMPRESSION: Overall pain  in LB reduced since last session.  Was able to moderately increase resistance to blue HB as tolerated alternating back to yellow to avoid over doing. Progressed with using LE movement to activate core with good toleration (Post left knee surgery).  Introduced plank which pt has some difficulty.  Good core engagement once position is attained.  Will continue to progress using the properties of water to meet land based goals      Initial Impression Patient is a 48 y.o. m who was seen today for physical therapy evaluation and treatment for lbp. He had a fall back in sept 2025 sustaining injuries to bilateral shoulder, hips; knees and back.  He has already undergone surgical repairs to left shoulder and right knee with scheduled procedures for June 2 (left knee) and spinal injections May 22.  He is presently being seen by another PT group for land based intervention and will continue as prescribed by MD's.  He has not been successful with land based PT for back pain/dysfunction which is why he presents here today for aquatic therapy intervention. Pain is moderately sensitive with radicular pattern into bilat le. Testing indicates he has bilateral le and core weakness, balance deficits, unmanaged LBP  and gait deficits.  All are effecting his functional mobility and ADL's.  He has not returned to work since fall.  He is a good candidate for aquatic intervention and will benefit from the properties of water to progress towards functional goals set.     OBJECTIVE IMPAIRMENTS: Abnormal gait, decreased activity tolerance, decreased balance, decreased endurance, decreased mobility, difficulty walking, decreased strength, and pain.   ACTIVITY LIMITATIONS: carrying, lifting, bending, sitting, standing, squatting, sleeping, stairs, transfers, and locomotion level  PARTICIPATION LIMITATIONS: meal prep, cleaning, laundry, shopping, community activity, occupation, and yard work  PERSONAL FACTORS: Multiple  orthopedic issues being delt with at same time are also affecting patient's functional outcome.   REHAB POTENTIAL: Good  CLINICAL DECISION MAKING: Evolving/moderate complexity  EVALUATION COMPLEXITY: Moderate   GOALS: Goals reviewed with patient? Yes  SHORT TERM GOALS: Target date: 05/05/24  Pt will tolerate full aquatic sessions consistently without increase in pain and with improving function to demonstrate good toleration and effectiveness of intervention.  Baseline: Goal status: Met 05/01/24  2.  Pt will reports reduction of pain with aquatic intervention by 25% Baseline: Pt reports overall reduction in LB and le Goal status: Met 05/03/24  3.  Pt will complete tandem and SLS with light ue support in 3.6 ft holding x 20 s Baseline:  Goal status: MET- 05/24/24  4.  Pt will complete STS from 3rd water step x 10 reps without LOB or limitation to pain ue unsupported Baseline:  Goal status: Partially met - 05/24/24  5.  Pt will climb 7 steps in and out of pool using a combination of step to and alternating pattern demonstraing improving LE and core strength Baseline:  Goal status: Met 05/03/24    LONG TERM GOALS: Target date: 06/28/24  Pt to improve on ODI by at least 13-15% (MCID) to demonstrate statistically significant Improvement in function. Baseline: Modified Oswestry 33/50= 66%  Goal status: INITIAL  2.  Pt will be indep with final HEP's (land and aquatic as appropriate) for continued management of condition Baseline:  Goal status: INITIAL  3.  Pt will report decrease in pain by at least 50% for improved toleration to activity/quality of life and to demonstrate improved management of pain. Baseline: see chart Goal status: INITIAL  4.  Pt will report reduced episode of waking at night due to pain Baseline: 2-3 Goal status: INITIAL  5.  Pt will improve strength in all areas listed by at least 10 lbs to demonstrate improved overall physical function Baseline:  Goal  status: INITIAL  6.  Pt will report no limitation from LBP with walking Baseline:  Goal status: INITIAL  PLAN:  PT FREQUENCY: 2x/week  PT DURATION: 10 weeks:  allowing 2 weeks out for surgical procedure June 2  PLANNED INTERVENTIONS: 02835- PT Re-evaluation, 97110-Therapeutic exercises, 97530- Therapeutic activity, [phone]- Neuromuscular re-education, [phone]- Self Care, 02859- Manual therapy, [phone]- Gait training, [phone]- Aquatic Therapy, [phone]- Electrical stimulation (unattended), [phone]- Ionotophoresis 4mg /ml Dexamethasone , Patient/Family education, Balance training, Stair training, Taping, Dry Needling, Joint mobilization, DME instructions, Cryotherapy, and Moist heat.  PLAN FOR NEXT SESSION: Aquatic only: core strengthening; LE (hips particularly) strengthening; lumbar/thoracic stretching; pain management; balance retraining  Ronal Foots) Clerence Gubser MPT 05/29/24 3:46 PM [facility] [address] [place], [place], [address] Phone: [phone]   Fax:  [phone]

## 2024-05-31 ENCOUNTER — Ambulatory Visit (HOSPITAL_BASED_OUTPATIENT_CLINIC_OR_DEPARTMENT_OTHER): Admitting: Physical Therapy

## 2024-06-03 ENCOUNTER — Encounter (HOSPITAL_BASED_OUTPATIENT_CLINIC_OR_DEPARTMENT_OTHER): Payer: Self-pay | Admitting: Physical Therapy

## 2024-06-03 ENCOUNTER — Ambulatory Visit (HOSPITAL_BASED_OUTPATIENT_CLINIC_OR_DEPARTMENT_OTHER): Payer: Worker's Compensation | Admitting: Physical Therapy

## 2024-06-03 DIAGNOSIS — M5459 Other low back pain: Secondary | ICD-10-CM | POA: Diagnosis not present

## 2024-06-03 DIAGNOSIS — R2689 Other abnormalities of gait and mobility: Secondary | ICD-10-CM

## 2024-06-03 DIAGNOSIS — M6281 Muscle weakness (generalized): Secondary | ICD-10-CM

## 2024-06-03 NOTE — Therapy (Signed)
 OUTPATIENT PHYSICAL THERAPY THORACOLUMBAR TREATMENT   Patient Name: Shane Erickson MRN: [id number] DOB:02-14-1976, 48 y.o., male Today's Date: 06/03/2024  END OF SESSION:  PT End of Session - 06/03/24 1455     Visit Number 9    Number of Visits 16    Date for PT Re-Evaluation 06/28/24    Authorization Type workers comp    PT Start Time 1448    PT Stop Time 1527    PT Time Calculation (min) 39 min    Activity Tolerance Patient tolerated treatment well    Behavior During Therapy WFL for tasks assessed/performed            Past Medical History:  Diagnosis Date   Anal fissure 03/05/2016   Hypertension    states under control with med., has been on med. x 2 yr.   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   Past Surgical History:  Procedure Laterality Date   MENISCUS REPAIR Left 03/22/2022   SHOULDER ARTHROSCOPY WITH DISTAL CLAVICLE RESECTION Right 02/09/2024   Procedure: SHOULDER ARTHROSCOPY WITH DISTAL CLAVICLE RESECTION;  Surgeon: Sharl Selinda Dover, MD;  Location: [facility];  Service: Orthopedics;  Laterality: Right;   SHOULDER ARTHROSCOPY WITH SUBACROMIAL DECOMPRESSION Right 02/09/2024   Procedure: SHOULDER ARTHROSCOPY WITH SUBACROMIAL DECOMPRESSION;  Surgeon: Sharl Selinda Dover, MD;  Location: [facility];  Service: Orthopedics;  Laterality: Right;   SPHINCTEROTOMY N/A 04/07/2016   Procedure: LATERAL INTERNAL SPHINCTEROTOMY;  Surgeon: Donnice Lunger, MD;  Location: [facility];  Service: General;  Laterality: N/A;   UMBILICAL HERNIA REPAIR  12/05/2010   Patient Active Problem List   Diagnosis Date Noted   Metabolic dysfunction-associated steatohepatitis (MASH) 02/27/2024   Prediabetes 02/27/2024   OSA (obstructive sleep apnea) - possibly severe, on CPAP 02/27/2024   Primary hypertension 02/27/2024   Class 3 severe obesity with serious comorbidity and body  mass index (BMI) of 40.0 to 44.9 in adult 02/27/2024   Decreased GFR 02/27/2024   Hx of rectal sphincterotomy May 2017 04/07/2016   Paresthesia 04/29/2014    PCP: Nell Piety FNP  REFERRING PROVIDER: Burnetta Aures, MD   REFERRING DIAG: other low back pain  Rationale for Evaluation and Treatment: Rehabilitation  THERAPY DIAG:  Other low back pain  Muscle weakness (generalized)  Other abnormalities of gait and mobility  ONSET DATE: 08/14/23  SUBJECTIVE:  SUBJECTIVE STATEMENT: Slept on hotel bed for a few night and my back is hurting.   Initial Subjective Fall 9/9 fell into hole at work.  Had multiple injuries including right wrist fx.  Hurt back both shoulders, both knees, hips and back.  Had spinal injection in March did not relieve pain. May 22 another spine injection scheduled. June 2nd arthroscopic left knee surgery for meniscus repair.  Had complex tear right knee with surgical intervention x 3 weeks ago. Left shoulder surgery 3/7 (see above for details). I am hoping to get my back stronger.  I am going [facility]hab for my shoulders and knees and coming here for my back for the water.  I did not tolerate (land) therapy for my back.  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  PERTINENT HISTORY:  Neuropathic leg pain right> left. . . .primarily the S1 dermatome. Selective nerve root block S1 02/21/24  02/09/2024 Surgery SHOULDER ARTHROSCOPY WITH SUBACROMIAL DECOMPRESSION   PAIN:  Are you having pain? Yes: NPRS scale: current 4/10; worst 8/10; least 5/10 Pain location: Small of back with radiation into LE to feet Pain description: lightening shooting into legs R>L Aggravating factors: bending; movement, waking >30 minutes Relieving factors: lying with feet elevated/reclines,  meds  PRECAUTIONS: None  RED FLAGS: None   WEIGHT BEARING RESTRICTIONS: No  FALLS:  Has patient fallen in last 6 months? Yes. Number of falls 1 into hole  LIVING ENVIRONMENT: Lives with: lives with their family Lives in: House/apartment Stairs: Yes: External: 16 steps; on right going up Has following equipment at home: None  OCCUPATION: truck driver  PLOF: Independent  PATIENT GOALS: decrease pain strengthening up the back  NEXT MD VISIT: next week  OBJECTIVE:  Note: Objective measures were completed at Evaluation unless otherwise noted.  DIAGNOSTIC FINDINGS:  The MRI did show a central disc protrusion with left greater than right S1 nerve irritation.  Bone spur L5   PATIENT SURVEYS:  Modified Oswestry 33/50= 66%   COGNITION: Overall cognitive status: Within functional limits for tasks assessed       MUSCLE LENGTH: Hamstrings: Right 40 deg; Left 60 deg   POSTURE: rounded shoulders and forward head  PALPATION: Moderate TTP throughout midthoracic and lumbar spine  LUMBAR ROM:   AROM eval  Flexion FT to mid calf  Extension 50% limited P!  Right lateral flexion Full P!  Left lateral flexion full  Right rotation   Left rotation    (Blank rows = not tested)    LOWER EXTREMITY testing:    HD (lbs) Tested in sitting Right eval Left eval  Hip flexion 30.6 39.9  Hip extension    Hip abduction 23.9 12.4  Hip adduction    Hip internal rotation    Hip external rotation    Knee flexion    Knee extension 27.5 21.5  Ankle dorsiflexion    Ankle plantarflexion    Ankle inversion    Ankle eversion     (Blank rows = not tested)  LUMBAR SPECIAL TESTS:  Straight leg raise test: Positive and Slump test: Positive  FUNCTIONAL TESTS:  Timed up and go (TUG): 19.61 4 stage balance: passed 1&2.  Tandem and SLS unable to complete  05/24/24:  SLS and tandem stance in 72ft6 for 20sec each LE  GAIT: Distance walked: 400 ft Assistive device utilized:  None Level of assistance: Complete Independence Comments: Guarded core, decreased arm swing, slowed cadence  TREATMENT  [facility] Adult PT Treatment:  DATE: 06/03/24 Pt seen for aquatic therapy today.  Treatment took place in water 3.5-4.75 ft in depth at the [facility] pool. Temp of water was 91.  Pt entered/exited the pool via stairs with hand rail.   *walking forward/ backward in 4+ft *side stepping UE addct/abdct with blue hand floats -> side lunge *Farmers carry  marching forward and back, using blue hand floats bilaterally *L stretch *solid noodle->yellow noodle stomp R and L LE ue support yellow HB hip in neutral then external rotation. 12 slow then 12 fast in each position *wide stance then staggered stances using red/medium bells arm swings 8-10 slow then 8-10 fast x 2-3 sets.  Cues for mindfulness with left shoulder. *STS from 3rd step unsupported x 10 indep *hip hinge   Pt requires the buoyancy and hydrostatic pressure of water for support, and to offload joints by unweighting joint load by at least 50 % in navel deep water and by at least 75-80% in chest to neck deep water.  Viscosity of the water is needed for resistance of strengthening. Water current perturbations provides challenge to standing balance requiring increased core activation.                                                                                                                               PATIENT EDUCATION:  Education details: intro to aquatic therapy  Person educated: Patient Education method: Explanation Education comprehension: verbalized understanding  HOME EXERCISE PROGRAM: Land based HEP being followed as instructed by [facility] for [facility] and shoulders Aquatic to be instructed  ASSESSMENT:  CLINICAL IMPRESSION: Pt reports some increase in LBP after sleeping in a hotel and being in the ocean. Discussed gaining pool access in next few  weeks to be able complete final HEP that will be assigned to help continue manage LBP (chronic).  Good toleration to core engagement using hand bells.  VC to ensure no exacerbation of shoulder pain.  He has met STG# 4.  He does report he continues to wake multiple times nightly due to LBP but is able to get back to sleep with improvement.        Initial Impression Patient is a 48 y.o. m who was seen today for physical therapy evaluation and treatment for lbp. He had a fall back in sept 2025 sustaining injuries to bilateral shoulder, hips; knees and back.  He has already undergone surgical repairs to left shoulder and right knee with scheduled procedures for June 2 (left knee) and spinal injections May 22.  He is presently being seen by another PT group for land based intervention and will continue as prescribed by MD's.  He has not been successful with land based PT for back pain/dysfunction which is why he presents here today for aquatic therapy intervention. Pain is moderately sensitive with radicular pattern into bilat le. Testing indicates he has bilateral le and core weakness, balance deficits, unmanaged LBP and gait deficits.  All  are effecting his functional mobility and ADL's.  He has not returned to work since fall.  He is a good candidate for aquatic intervention and will benefit from the properties of water to progress towards functional goals set.     OBJECTIVE IMPAIRMENTS: Abnormal gait, decreased activity tolerance, decreased balance, decreased endurance, decreased mobility, difficulty walking, decreased strength, and pain.   ACTIVITY LIMITATIONS: carrying, lifting, bending, sitting, standing, squatting, sleeping, stairs, transfers, and locomotion level  PARTICIPATION LIMITATIONS: meal prep, cleaning, laundry, shopping, community activity, occupation, and yard work  PERSONAL FACTORS: Multiple orthopedic issues being delt with at same time are also affecting patient's functional  outcome.   REHAB POTENTIAL: Good  CLINICAL DECISION MAKING: Evolving/moderate complexity  EVALUATION COMPLEXITY: Moderate   GOALS: Goals reviewed with patient? Yes  SHORT TERM GOALS: Target date: 05/05/24  Pt will tolerate full aquatic sessions consistently without increase in pain and with improving function to demonstrate good toleration and effectiveness of intervention.  Baseline: Goal status: Met 05/01/24  2.  Pt will reports reduction of pain with aquatic intervention by 25% Baseline: Pt reports overall reduction in LB and le Goal status: Met 05/03/24  3.  Pt will complete tandem and SLS with light ue support in 3.6 ft holding x 20 s Baseline:  Goal status: MET- 05/24/24  4.  Pt will complete STS from 3rd water step x 10 reps without LOB or limitation to pain ue unsupported Baseline:  Goal status: Partially met - 05/24/24; Met 06/03/24  5.  Pt will climb 7 steps in and out of pool using a combination of step to and alternating pattern demonstraing improving LE and core strength Baseline:  Goal status: Met 05/03/24    LONG TERM GOALS: Target date: 06/28/24  Pt to improve on ODI by at least 13-15% (MCID) to demonstrate statistically significant Improvement in function. Baseline: Modified Oswestry 33/50= 66%  Goal status: INITIAL  2.  Pt will be indep with final HEP's (land and aquatic as appropriate) for continued management of condition Baseline:  Goal status: INITIAL  3.  Pt will report decrease in pain by at least 50% for improved toleration to activity/quality of life and to demonstrate improved management of pain. Baseline: see chart Goal status: In progress 06/03/24  4.  Pt will report reduced episode of waking at night due to pain Baseline: 2-3 Goal status: In progress 06/03/24  5.  Pt will improve strength in all areas listed by at least 10 lbs to demonstrate improved overall physical function Baseline:  Goal status: INITIAL  6.  Pt will report no limitation  from LBP with walking Baseline:  Goal status: INITIAL  PLAN:  PT FREQUENCY: 2x/week  PT DURATION: 10 weeks:  allowing 2 weeks out for surgical procedure June 2  PLANNED INTERVENTIONS: 02835- PT Re-evaluation, 97110-Therapeutic exercises, 97530- Therapeutic activity, [phone]- Neuromuscular re-education, [phone]- Self Care, 02859- Manual therapy, [phone]- Gait training, [phone]- Aquatic Therapy, [phone]- Electrical stimulation (unattended), [phone]- Ionotophoresis 4mg /ml Dexamethasone , Patient/Family education, Balance training, Stair training, Taping, Dry Needling, Joint mobilization, DME instructions, Cryotherapy, and Moist heat.  PLAN FOR NEXT SESSION: Aquatic only: core strengthening; LE (hips particularly) strengthening; lumbar/thoracic stretching; pain management; balance retraining  Ronal Kem) Jaymes Revels MPT 06/03/24 3:13 PM [facility] [address] [place], [place], [address] Phone: [phone]   Fax:  [phone]

## 2024-06-05 ENCOUNTER — Ambulatory Visit (HOSPITAL_BASED_OUTPATIENT_CLINIC_OR_DEPARTMENT_OTHER): Payer: Worker's Compensation | Attending: Orthopedic Surgery | Admitting: Physical Therapy

## 2024-06-05 ENCOUNTER — Encounter (HOSPITAL_BASED_OUTPATIENT_CLINIC_OR_DEPARTMENT_OTHER): Payer: Self-pay | Admitting: Physical Therapy

## 2024-06-05 DIAGNOSIS — R2689 Other abnormalities of gait and mobility: Secondary | ICD-10-CM | POA: Insufficient documentation

## 2024-06-05 DIAGNOSIS — M5459 Other low back pain: Secondary | ICD-10-CM | POA: Insufficient documentation

## 2024-06-05 DIAGNOSIS — M6281 Muscle weakness (generalized): Secondary | ICD-10-CM | POA: Insufficient documentation

## 2024-06-05 NOTE — Therapy (Addendum)
 OUTPATIENT PHYSICAL THERAPY THORACOLUMBAR TREATMENT   Patient Name: Shane Erickson MRN: [id number] DOB:10/06/1976, 48 y.o., male Today's Date: 06/05/2024  END OF SESSION:  PT End of Session - 06/05/24 1457     Visit Number 10    Number of Visits 16    Date for PT Re-Evaluation 06/28/24    Authorization Type workers comp    PT Start Time 1445    PT Stop Time 1525    PT Time Calculation (min) 40 min    Activity Tolerance Patient tolerated treatment well    Behavior During Therapy WFL for tasks assessed/performed             Past Medical History:  Diagnosis Date   Anal fissure 03/05/2016   Hypertension    states under control with med., has been on med. x 2 yr.   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   Past Surgical History:  Procedure Laterality Date   MENISCUS REPAIR Left 03/22/2022   SHOULDER ARTHROSCOPY WITH DISTAL CLAVICLE RESECTION Right 02/09/2024   Procedure: SHOULDER ARTHROSCOPY WITH DISTAL CLAVICLE RESECTION;  Surgeon: Sharl Selinda Dover, MD;  Location: [facility];  Service: Orthopedics;  Laterality: Right;   SHOULDER ARTHROSCOPY WITH SUBACROMIAL DECOMPRESSION Right 02/09/2024   Procedure: SHOULDER ARTHROSCOPY WITH SUBACROMIAL DECOMPRESSION;  Surgeon: Sharl Selinda Dover, MD;  Location: [facility];  Service: Orthopedics;  Laterality: Right;   SPHINCTEROTOMY N/A 04/07/2016   Procedure: LATERAL INTERNAL SPHINCTEROTOMY;  Surgeon: Donnice Lunger, MD;  Location:  SURGERY CENTER;  Service: General;  Laterality: N/A;   UMBILICAL HERNIA REPAIR  12/05/2010   Patient Active Problem List   Diagnosis Date Noted   Metabolic dysfunction-associated steatohepatitis (MASH) 02/27/2024   Prediabetes 02/27/2024   OSA (obstructive sleep apnea) - possibly severe, on CPAP 02/27/2024   Primary hypertension 02/27/2024   Class 3 severe obesity with serious comorbidity and body  mass index (BMI) of 40.0 to 44.9 in adult 02/27/2024   Decreased GFR 02/27/2024   Hx of rectal sphincterotomy May 2017 04/07/2016   Paresthesia 04/29/2014    PCP: Nell Piety FNP  REFERRING PROVIDER: Burnetta Aures, MD   REFERRING DIAG: other low back pain  Rationale for Evaluation and Treatment: Rehabilitation  THERAPY DIAG:  Other low back pain  Muscle weakness (generalized)  Other abnormalities of gait and mobility  ONSET DATE: 08/14/23  SUBJECTIVE:  SUBJECTIVE STATEMENT: Doing well, slept all night, pain is better  Will have to have surgery on my right ankle.   Initial Subjective Fall 9/9 fell into hole at work.  Had multiple injuries including right wrist fx.  Hurt back both shoulders, both knees, hips and back.  Had spinal injection in March did not relieve pain. May 22 another spine injection scheduled. June 2nd arthroscopic left knee surgery for meniscus repair.  Had complex tear right knee with surgical intervention x 3 weeks ago. Left shoulder surgery 3/7 (see above for details). I am hoping to get my back stronger.  I am going [facility]hab for my shoulders and knees and coming here for my back for the water.  I did not tolerate (land) therapy for my back.  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  PERTINENT HISTORY:  Neuropathic leg pain right> left. . . .primarily the S1 dermatome. Selective nerve root block S1 02/21/24  02/09/2024 Surgery SHOULDER ARTHROSCOPY WITH SUBACROMIAL DECOMPRESSION   PAIN:  Are you having pain? Yes: NPRS scale: current 3/10; worst 8/10; Pain location: Small of back with radiation into LE to feet Pain description: lightening shooting into legs R>L Aggravating factors: bending; movement, waking >30 minutes Relieving factors: lying with feet  elevated/reclines, meds  PRECAUTIONS: None  RED FLAGS: None   WEIGHT BEARING RESTRICTIONS: No  FALLS:  Has patient fallen in last 6 months? Yes. Number of falls 1 into hole  LIVING ENVIRONMENT: Lives with: lives with their family Lives in: House/apartment Stairs: Yes: External: 16 steps; on right going up Has following equipment at home: None  OCCUPATION: truck driver  PLOF: Independent  PATIENT GOALS: decrease pain strengthening up the back  NEXT MD VISIT: next week  OBJECTIVE:  Note: Objective measures were completed at Evaluation unless otherwise noted.  DIAGNOSTIC FINDINGS:  The MRI did show a central disc protrusion with left greater than right S1 nerve irritation.  Bone spur L5   PATIENT SURVEYS:  Modified Oswestry 33/50= 66%   COGNITION: Overall cognitive status: Within functional limits for tasks assessed       MUSCLE LENGTH: Hamstrings: Right 40 deg; Left 60 deg   POSTURE: rounded shoulders and forward head  PALPATION: Moderate TTP throughout midthoracic and lumbar spine  LUMBAR ROM:   AROM eval  Flexion FT to mid calf  Extension 50% limited P!  Right lateral flexion Full P!  Left lateral flexion full  Right rotation   Left rotation    (Blank rows = not tested)    LOWER EXTREMITY testing:    HD (lbs) Tested in sitting Right eval Left eval  Hip flexion 30.6 39.9  Hip extension    Hip abduction 23.9 12.4  Hip adduction    Hip internal rotation    Hip external rotation    Knee flexion    Knee extension 27.5 21.5  Ankle dorsiflexion    Ankle plantarflexion    Ankle inversion    Ankle eversion     (Blank rows = not tested)  LUMBAR SPECIAL TESTS:  Straight leg raise test: Positive and Slump test: Positive  FUNCTIONAL TESTS:  Timed up and go (TUG): 19.61 4 stage balance: passed 1&2.  Tandem and SLS unable to complete  05/24/24:  SLS and tandem stance in 12ft6 for 20sec each LE  GAIT: Distance walked: 400 ft Assistive  device utilized: None Level of assistance: Complete Independence Comments: Guarded core, decreased arm swing, slowed cadence  TREATMENT  [facility] Adult PT Treatment:  DATE: 06/03/24 Pt seen for aquatic therapy today.  Treatment took place in water 3.5-4.75 ft in depth at the [facility] pool. Temp of water was 91.  Pt entered/exited the pool via stairs with hand rail.   *walking forward/ backward in 4+ft *side stepping UE addct/abdct with blue hand floats -> side lunge *hip hinge x 10.  VC for pause at L position for LB stretch *Farmers carry  marching forward and back, using blue hand floats bilaterally *yellow noodle stomp R and L LE ue support yellow HB hip in neutral then external rotation. 12 slow then 12 fast in each position *wide stance then staggered stances using red/medium bells arm swings 8-10 slow then 8-10 fast x 3 sets.  *wall push up/off x 10 *STS from 3rd step unsupported x 10 indep    Pt requires the buoyancy and hydrostatic pressure of water for support, and to offload joints by unweighting joint load by at least 50 % in navel deep water and by at least 75-80% in chest to neck deep water.  Viscosity of the water is needed for resistance of strengthening. Water current perturbations provides challenge to standing balance requiring increased core activation.                                                                                                                               PATIENT EDUCATION:  Education details: intro to aquatic therapy  Person educated: Patient Education method: Explanation Education comprehension: verbalized understanding  HOME EXERCISE PROGRAM: Land based HEP being followed as instructed by [facility] for knees and shoulders  Aquatic Access Code: [email or web address] URL: [email or web address] Date: 06/12/2024 Prepared by: Frankie Tadashi Burkel  Exercises - Hand buoy carry: Forward  and Backward; bilaterally->unilaterally  - 1 x daily - 1-3 x weekly - Side lunge with hand buoys  - 1 x daily - 1-3 x weekly - 1-3 sets - 10 reps - Standing Hip Hinge  - 1 x daily - 1-3 x weekly - 1-3 sets - 10 reps - Noodle Stomp  - 1 x daily - 1-3 x weekly - 1-3 sets - 15 reps - Push-Up on Pool Wall  - 1 x daily - 1-3 x weekly - 3 sets - 15 reps - Push-Up on Pool Wall  - 1 x daily - 1-3 x weekly - 1-3 sets - 15 reps - Sit to Stand Without Arm Support  - 1 x daily - 1-3 x weekly - 1-3 sets - 12 reps  Addend Ronal Kem) Shia Delaine MPT 06/12/24 3:03 PM [facility] [address] [place], [place], [address] Phone: [phone]   Fax:  [phone]   ASSESSMENT:  CLINICAL IMPRESSION: Good toleration of progressive core strengthening. Improved core stability with hand bells.  VC provided throughout for form and execution. Pt does have 1 night sleeping throughout without interruption due to pain. Pt giving good effort.  Pt reports reduction of pain throughout session.  Initial Impression Patient is a 48 y.o. m who was seen today for physical therapy evaluation and treatment for lbp. He had a fall back in sept 2025 sustaining injuries to bilateral shoulder, hips; knees and back.  He has already undergone surgical repairs to left shoulder and right knee with scheduled procedures for June 2 (left knee) and spinal injections May 22.  He is presently being seen by another PT group for land based intervention and will continue as prescribed by MD's.  He has not been successful with land based PT for back pain/dysfunction which is why he presents here today for aquatic therapy intervention. Pain is moderately sensitive with radicular pattern into bilat le. Testing indicates he has bilateral le and core weakness, balance deficits, unmanaged LBP and gait deficits.  All are effecting his functional mobility and ADL's.  He has not returned  to work since fall.  He is a good candidate for aquatic intervention and will benefit from the properties of water to progress towards functional goals set.     OBJECTIVE IMPAIRMENTS: Abnormal gait, decreased activity tolerance, decreased balance, decreased endurance, decreased mobility, difficulty walking, decreased strength, and pain.   ACTIVITY LIMITATIONS: carrying, lifting, bending, sitting, standing, squatting, sleeping, stairs, transfers, and locomotion level  PARTICIPATION LIMITATIONS: meal prep, cleaning, laundry, shopping, community activity, occupation, and yard work  PERSONAL FACTORS: Multiple orthopedic issues being delt with at same time are also affecting patient's functional outcome.   REHAB POTENTIAL: Good  CLINICAL DECISION MAKING: Evolving/moderate complexity  EVALUATION COMPLEXITY: Moderate   GOALS: Goals reviewed with patient? Yes  SHORT TERM GOALS: Target date: 05/05/24  Pt will tolerate full aquatic sessions consistently without increase in pain and with improving function to demonstrate good toleration and effectiveness of intervention.  Baseline: Goal status: Met 05/01/24  2.  Pt will reports reduction of pain with aquatic intervention by 25% Baseline: Pt reports overall reduction in LB and le Goal status: Met 05/03/24  3.  Pt will complete tandem and SLS with light ue support in 3.6 ft holding x 20 s Baseline:  Goal status: MET- 05/24/24  4.  Pt will complete STS from 3rd water step x 10 reps without LOB or limitation to pain ue unsupported Baseline:  Goal status: Partially met - 05/24/24; Met 06/03/24  5.  Pt will climb 7 steps in and out of pool using a combination of step to and alternating pattern demonstraing improving LE and core strength Baseline:  Goal status: Met 05/03/24    LONG TERM GOALS: Target date: 06/28/24  Pt to improve on ODI by at least 13-15% (MCID) to demonstrate statistically significant Improvement in function. Baseline: Modified  Oswestry 33/50= 66%  Goal status: INITIAL  2.  Pt will be indep with final HEP's (land and aquatic as appropriate) for continued management of condition Baseline:  Goal status: INITIAL  3.  Pt will report decrease in pain by at least 50% for improved toleration to activity/quality of life and to demonstrate improved management of pain. Baseline: see chart Goal status: In progress 06/03/24  4.  Pt will report reduced episode of waking at night due to pain Baseline: 2-3 Goal status: In progress 06/03/24  5.  Pt will improve strength in all areas listed by at least 10 lbs to demonstrate improved overall physical function Baseline:  Goal status: INITIAL  6.  Pt will report no limitation from LBP with walking Baseline:  Goal status: INITIAL  PLAN:  PT FREQUENCY: 2x/week  PT DURATION: 10 weeks:  allowing 2 weeks out for surgical procedure June 2  PLANNED INTERVENTIONS: 02835- PT Re-evaluation, 97110-Therapeutic exercises, 97530- Therapeutic activity, [phone]- Neuromuscular re-education, [phone]- Self Care, 02859- Manual therapy, [phone]- Gait training, [phone]- Aquatic Therapy, [phone]- Electrical stimulation (unattended), [phone]- Ionotophoresis 4mg /ml Dexamethasone , Patient/Family education, Balance training, Stair training, Taping, Dry Needling, Joint mobilization, DME instructions, Cryotherapy, and Moist heat.  PLAN FOR NEXT SESSION: Aquatic only: core strengthening; LE (hips particularly) strengthening; lumbar/thoracic stretching; pain management; balance retraining  Ronal Kem) Glennette Galster MPT 06/05/24 3:22 PM [facility] [address] [place], [place], [address] Phone: [phone]   Fax:  [phone]

## 2024-06-12 ENCOUNTER — Other Ambulatory Visit: Payer: Self-pay

## 2024-06-12 ENCOUNTER — Telehealth (HOSPITAL_BASED_OUTPATIENT_CLINIC_OR_DEPARTMENT_OTHER): Payer: Self-pay | Admitting: Physical Therapy

## 2024-06-12 ENCOUNTER — Ambulatory Visit (HOSPITAL_BASED_OUTPATIENT_CLINIC_OR_DEPARTMENT_OTHER): Payer: Worker's Compensation | Admitting: Physical Therapy

## 2024-06-12 ENCOUNTER — Encounter (HOSPITAL_BASED_OUTPATIENT_CLINIC_OR_DEPARTMENT_OTHER): Payer: Self-pay | Admitting: Orthopaedic Surgery

## 2024-06-12 NOTE — Telephone Encounter (Signed)
 Missed visit.  Spoke with pt who states he meant to cancel.  Will be having ankle surgery in 2 weeks.  Wants to cancel remaining appts until after procedure and recovery.  Will need to re-cert at that time with plan to progress towards goals as set for LBP and instruct on final aquatic HEP.

## 2024-06-14 ENCOUNTER — Ambulatory Visit (HOSPITAL_BASED_OUTPATIENT_CLINIC_OR_DEPARTMENT_OTHER): Payer: Worker's Compensation | Admitting: Physical Therapy

## 2024-06-17 ENCOUNTER — Emergency Department (HOSPITAL_COMMUNITY)
Admission: EM | Admit: 2024-06-17 | Discharge: 2024-06-17 | Disposition: A | Discharge status: Home / Self Care | Attending: Emergency Medicine | Admitting: Emergency Medicine

## 2024-06-17 ENCOUNTER — Encounter (HOSPITAL_COMMUNITY): Payer: Self-pay

## 2024-06-17 ENCOUNTER — Other Ambulatory Visit: Payer: Self-pay

## 2024-06-17 DIAGNOSIS — K644 Residual hemorrhoidal skin tags: Secondary | ICD-10-CM | POA: Insufficient documentation

## 2024-06-17 DIAGNOSIS — K649 Unspecified hemorrhoids: Secondary | ICD-10-CM | POA: Diagnosis present

## 2024-06-17 MED ORDER — HYDROCORTISONE ACETATE 25 MG RE SUPP: 25.0000 mg | Freq: Two times a day (BID) | RECTAL | 0 refills | Status: DC

## 2024-06-17 MED ORDER — HYDROCORTISONE (PERIANAL) 2.5 % EX CREA: 1.0000 | TOPICAL_CREAM | Freq: Two times a day (BID) | CUTANEOUS | 0 refills | Status: DC

## 2024-06-17 MED ORDER — NAPROXEN 250 MG PO TABS
500.0000 mg | ORAL_TABLET | Freq: Once | ORAL | Status: AC
  Administered 2024-06-17: 500 mg via ORAL
  Filled 2024-06-17: qty 2

## 2024-06-17 NOTE — ED Triage Notes (Signed)
 Pt arrived via POV c/o bleeding hemorrhoids since Thursday. Pt repots he has tried multiple interventions w/o relief.

## 2024-06-17 NOTE — H&P (Signed)
 ORTHOPAEDIC SURGERY H&P  Subjective:  The patient presents for right ankle arthroscopy with peroneal tendons debridement with possible repair and/or transfer.   Past Medical History:  Diagnosis Date   Anal fissure 03/05/2016   Hypertension    states under control with med., has been on med. x 2 yr.   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    Past Surgical History:  Procedure Laterality Date   MENISCUS REPAIR Left 03/22/2022   SHOULDER ARTHROSCOPY WITH DISTAL CLAVICLE RESECTION Right 02/09/2024   Procedure: SHOULDER ARTHROSCOPY WITH DISTAL CLAVICLE RESECTION;  Surgeon: Sharl Selinda Dover, MD;  Location: [facility];  Service: Orthopedics;  Laterality: Right;   SHOULDER ARTHROSCOPY WITH SUBACROMIAL DECOMPRESSION Right 02/09/2024   Procedure: SHOULDER ARTHROSCOPY WITH SUBACROMIAL DECOMPRESSION;  Surgeon: Sharl Selinda Dover, MD;  Location: [facility];  Service: Orthopedics;  Laterality: Right;   SPHINCTEROTOMY N/A 04/07/2016   Procedure: LATERAL INTERNAL SPHINCTEROTOMY;  Surgeon: Donnice Lunger, MD;  Location: [facility];  Service: General;  Laterality: N/A;   UMBILICAL HERNIA REPAIR  12/05/2010     (Not in an outpatient encounter)    Allergies  Allergen Reactions   Chapstick Rash    BLISTERS   Sunscreens Rash    BLISTERS   Pork-Derived Products Other (See Comments)    Preference    Soap Itching    [place]    Social History   Socioeconomic History   Marital status: Married    Spouse name: Not on file   Number of children: Not on file   Years of education: Not on file   Highest education level: Not on file  Occupational History   Occupation: truck driver  Tobacco Use   Smoking status: Former    Types: E-cigarettes   Smokeless tobacco: Never  Vaping Use   Vaping status: Never Used  Substance and Sexual Activity   Alcohol use: Yes    Comment:  rarely   Drug use: No   Sexual activity: Never  Other Topics Concern   Not on file  Social History Narrative   Not on file   Social Drivers of Health   Financial Resource Strain: Not on file  Food Insecurity: Not on file  Transportation Needs: Not on file  Physical Activity: Not on file  Stress: Not on file  Social Connections: Not on file  Intimate Partner Violence: Not on file     History reviewed. No pertinent family history.   Review of Systems Pertinent items are noted in HPI.  Objective: Vital signs in last 24 hours:    06/12/2024   12:09 PM 02/27/2024   11:00 AM 02/09/2024    1:52 PM  Vitals with BMI  Height 6' 0 5' 10   Weight 311 lbs 310 lbs   BMI 42.17 44.48   Systolic  131 138  Diastolic  92 97  Pulse  67 78      EXAM: General: Well nourished, well developed. Awake, alert and oriented to time, place, person. Normal mood and affect. No apparent distress. Breathing room air.  Operative Lower Extremity: Alignment - Neutral Deformity - None Skin intact Tenderness to palpation - peroneal tendons 5/5 TA, PT, GS, Per, EHL, FHL Sensation intact to light touch throughout Palpable DP and PT pulses Special testing: None  The contralateral foot/ankle was examined for comparison and noted to be neurovascularly intact with no  localized deformity, swelling, or tenderness.  Imaging Review All images taken were independently reviewed by me.  Assessment/Plan: The clinical and radiographic findings were reviewed and discussed at length with the patient.  The patient presents for right ankle arthroscopy with peroneal tendons debridement with possible repair and/or transfer.  We spoke at length about the natural course of these findings. We discussed nonoperative and operative treatment options in detail.  The risks and benefits were presented and reviewed. The risks due to suture/hardware failure/irritation (or if removing hardware inability to remove part/all  of hardware, recurrent instability), new/persistent/recurrent infection, stiffness, nerve/vessel/tendon injury, nonunion/malunion of any fracture, wound healing issues, allograft usage, development of arthritis, failure of this surgery, possibility of external fixation in certain situations, possibility of delayed definitive surgery, need for further surgery, prolonged wound care including further soft tissue coverage procedures, thromboembolic events, anesthesia/medical complications/events perioperatively and beyond, amputation, death among others were discussed. The patient acknowledged the explanation and agreed to proceed with the plan.  Shane Erickson  Orthopaedic Surgery [facility]

## 2024-06-17 NOTE — Discharge Instructions (Addendum)
 Would strongly recommend that you try to stop taking as much of the pain medications as you are taking as they are causing constipation.  Additionally you must continue the Linzess and I would recommend taking MiraLAX up to 3 times a day to help soften the stools and loosen them up.  Constipation is one of the main causes of hemorrhoids.  I have also prescribed a topical Anusol  cream, alternatively you can use the suppository.  I have also recommended that you follow-up with a gastroenterologist if you are no better within 2 weeks.  Please see the phone number above

## 2024-06-17 NOTE — Discharge Instructions (Signed)
 Shane [place]ars, MD [facility]  Please read the following information regarding your care after surgery.  Medications  You only need a prescription for the narcotic pain medicine (ex. oxycodone, Percocet, Norco).  All of the other medicines listed below are available over the counter. ? Aleve 2 pills twice a day for the first 3 days after surgery. ? acetominophen (Tylenol) 650 mg every 4-6 hours as you need for minor to moderate pain ? oxycodone as prescribed for severe pain  ? To help prevent blood clots, take aspirin (81 mg) twice daily for at least 42 days after surgery.  You should also get up every hour while you are awake to move around.  Weight Bearing ? Do NOT bear any weight on the operated leg or foot. This means do NOT touch your surgical leg to the ground!  Cast / Splint / Dressing ? If you have a splint, do NOT remove this. Keep your splint, cast or dressing clean and dry.  Don't put anything (coat hanger, pencil, etc) down inside of it.  If it gets wet, call the office immediately to schedule an appointment for a cast change.  Swelling IMPORTANT: It is normal for you to have swelling where you had surgery. To reduce swelling and pain, keep at least 3 pillows under your leg so that your toes are above your nose and your heel is above the level of your hip.  It may be necessary to keep your foot or leg elevated for several weeks.  This is critical to helping your incisions heal and your pain to feel better.  Follow Up Call my office at [phone] when you are discharged from the hospital or surgery center to schedule an appointment to be seen 7-10 days after surgery.  Call my office at [phone] if you develop a fever >101.5 F, nausea, vomiting, bleeding from the surgical site or severe pain.     Post Anesthesia Home Care Instructions  Activity: Get plenty of rest for the remainder of the day. A responsible individual must stay with you for 24 hours following the  procedure.  For the next 24 hours, DO NOT: -Drive a car -Advertising copywriter -Drink alcoholic beverages -Take any medication unless instructed by your physician -Make any legal decisions or sign important papers.  Meals: Start with liquid foods such as gelatin or soup. Progress to regular foods as tolerated. Avoid greasy, spicy, heavy foods. If nausea and/or vomiting occur, drink only clear liquids until the nausea and/or vomiting subsides. Call your physician if vomiting continues.  Special Instructions/Symptoms: Your throat may feel dry or sore from the anesthesia or the breathing tube placed in your throat during surgery. If this causes discomfort, gargle with warm salt water. The discomfort should disappear within 24 hours.  If you had a scopolamine patch placed behind your ear for the management of post- operative nausea and/or vomiting:  1. The medication in the patch is effective for 72 hours, after which it should be removed.  Wrap patch in a tissue and discard in the trash. Wash hands thoroughly with soap and water. 2. You may remove the patch earlier than 72 hours if you experience unpleasant side effects which may include dry mouth, dizziness or visual disturbances. 3. Avoid touching the patch. Wash your hands with soap and water after contact with the patch.  Regional Anesthesia Blocks  1. You may not be able to move or feel the "blocked" extremity after a regional anesthetic block. This may last may last  from 3-48 hours after placement, but it will go away. The length of time depends on the medication injected and your individual response to the medication. As the nerves start to wake up, you may experience tingling as the movement and feeling returns to your extremity. If the numbness and inability to move your extremity has not gone away after 48 hours, please call your surgeon.   2. The extremity that is blocked will need to be protected until the numbness is gone and the  strength has returned. Because you cannot feel it, you will need to take extra care to avoid injury. Because it may be weak, you may have difficulty moving it or using it. You may not know what position it is in without looking at it while the block is in effect.  3. For blocks in the legs and feet, returning to weight bearing and walking needs to be done carefully. You will need to wait until the numbness is entirely gone and the strength has returned. You should be able to move your leg and foot normally before you try and bear weight or walk. You will need someone to be with you when you first try to ensure you do not fall and possibly risk injury.  4. Bruising and tenderness at the needle site are common side effects and will resolve in a few days.  5. Persistent numbness or new problems with movement should be communicated to the surgeon or the [facility] [phone] [facility] [phone]).

## 2024-06-17 NOTE — ED Provider Notes (Signed)
 [place] EMERGENCY DEPARTMENT AT [facility] Provider Note   CSN: [id number] Arrival date & time: 06/17/24  1824     Patient presents with: Hemorrhoids   Shane Erickson is a 48 y.o. male.   HPI   This patient is a 48 year old male, unfortunately he is currently dealing with multiple traumatic injuries and is requiring multiple surgeries over the year.  He has been on opiate medications and unfortunately has suffered with constipation for which she has been prescribed Linzess.  Over the last week he has noticed some increasing discomfort in the rectum and has noticed some spotting from some hemorrhoids.  He has been using over-the-counter topical creams without relief.  He is constipated and currently actively bearing down with bowel movements.  Prior to Admission medications   Medication Sig Start Date End Date Taking? Authorizing Provider  hydrocortisone  (ANUSOL -HC) 2.5 % rectal cream Place 1 Application rectally 2 (two) times daily. 06/17/24  Yes Cleotilde Rogue, MD  hydrocortisone  (ANUSOL -HC) 25 MG suppository Place 1 suppository (25 mg total) rectally 2 (two) times daily. 06/17/24  Yes Cleotilde Rogue, MD  amLODipine (NORVASC) 10 MG tablet Take 1 tablet by mouth daily.    [provider]  cyclobenzaprine  (FLEXERIL ) 10 MG tablet Take 10 mg by mouth 3 (three) times daily as needed for muscle spasms.    [provider]  gabapentin (NEURONTIN) 300 MG capsule Take 300 mg by mouth 3 (three) times daily.    [provider]  ondansetron  (ZOFRAN -ODT) 4 MG disintegrating tablet Take 1 tablet (4 mg total) by mouth every 8 (eight) hours as needed for nausea or vomiting. 02/09/24   Sharl Selinda Dover, MD  OXcarbazepine (TRILEPTAL) 150 MG tablet Take by mouth. 11/11/20   [provider]  oxyCODONE  (ROXICODONE ) 5 MG immediate release tablet Take 1 tablet (5 mg total) by mouth every 4 (four) hours as needed for moderate pain (pain score 4-6) or severe pain  (pain score 7-10). 02/09/24 02/08/25  Sharl Selinda Dover, MD  traMADol (ULTRAM) 50 MG tablet Take 1 tablet every 6 hours by oral route as needed, for moderate pain. 10/24/23   [provider]  traZODone (DESYREL) 50 MG tablet Take 50-100 mg by mouth at bedtime as needed. Patient not taking: Reported on 02/27/2024    [provider]  UNABLE TO FIND Not applicable, 0 Refill(s) 07/13/18   [provider]    Allergies: Chapstick, Sunscreens, Pork-derived products, and Soap    Review of Systems  All other systems reviewed and are negative.   Updated Vital Signs BP (!) 116/102 (BP Location: Right Arm)   Pulse (!) 117   Temp 99 F (37.2 C) (Oral)   Resp 20   Ht 1.829 m (6')   Wt (!) 141.1 kg   SpO2 98%   BMI 42.18 kg/m   Physical Exam Vitals and nursing note reviewed.  Constitutional:      General: He is not in acute distress.    Appearance: He is well-developed.  HENT:     Head: Normocephalic and atraumatic.     Mouth/Throat:     Pharynx: No oropharyngeal exudate.  Eyes:     General: No scleral icterus.       Right eye: No discharge.        Left eye: No discharge.     Conjunctiva/sclera: Conjunctivae normal.     Pupils: Pupils are equal, round, and reactive to light.  Neck:     Thyroid: No thyromegaly.  Vascular: No JVD.  Cardiovascular:     Rate and Rhythm: Normal rate and regular rhythm.     Heart sounds: Normal heart sounds. No murmur heard.    No friction rub. No gallop.  Pulmonary:     Effort: Pulmonary effort is normal. No respiratory distress.     Breath sounds: Normal breath sounds. No wheezing or rales.  Abdominal:     General: Bowel sounds are normal. There is no distension.     Palpations: Abdomen is soft. There is no mass.     Tenderness: There is no abdominal tenderness.  Genitourinary:    Comments: With chaperone present the patient was undressed placed in the left lateral decubitus position, his anal tissue was examined and he  does have 2 small nonthrombosed but mildly tender hemorrhoids, nonbleeding, stool was brown Musculoskeletal:        General: No tenderness. Normal range of motion.     Cervical back: Normal range of motion and neck supple.  Lymphadenopathy:     Cervical: No cervical adenopathy.  Skin:    General: Skin is warm and dry.     Findings: No erythema or rash.  Neurological:     Mental Status: He is alert.     Coordination: Coordination normal.  Psychiatric:        Behavior: Behavior normal.     (all labs ordered are listed, but only abnormal results are displayed) Labs Reviewed - No data to display  EKG: None  Radiology: No results found.   Procedures   Medications Ordered in the ED - No data to display                                  Medical Decision Making Risk Prescription drug management.   Nonbleeding nonthrombosed hemorrhoids, supportive care The patient is not tachycardic on my exam The patient is currently on multiple opiates and was counseled that this is likely making things worse, he will continue the Linzess and add MiraLAX, I have also prescribed Anusol  cream as well as suppositories.  He can follow-up with GI as needed, the patient is agreeable  Return precautions given     Final diagnoses:  External hemorrhoids    ED Discharge Orders          Ordered    hydrocortisone  (ANUSOL -HC) 25 MG suppository  2 times daily        06/17/24 1855    hydrocortisone  (ANUSOL -HC) 2.5 % rectal cream  2 times daily        06/17/24 DANIAL               Cleotilde Rogue, MD 06/17/24 1858

## 2024-06-18 NOTE — Anesthesia Preprocedure Evaluation (Signed)
 Anesthesia Evaluation  Patient identified by MRN, date of birth, ID band Patient awake    Reviewed: Allergy & Precautions, NPO status , Patient's Chart, lab work & pertinent test results  History of Anesthesia Complications Negative for: history of anesthetic complications  Airway Mallampati: III  TM Distance: >3 FB Neck ROM: Full    Dental  (+) Chipped, Dental Advisory Given, Poor Dentition, Missing,    Pulmonary sleep apnea and Continuous Positive Airway Pressure Ventilation , former smoker   Pulmonary exam normal breath sounds clear to auscultation       Cardiovascular hypertension, Pt. on medications Normal cardiovascular exam Rhythm:Regular Rate:Normal     Neuro/Psych negative neurological ROS  negative psych ROS   GI/Hepatic negative GI ROS,,,(+) Hepatitis -  Endo/Other    Class 3 obesity  Renal/GU negative Renal ROS  negative genitourinary   Musculoskeletal negative musculoskeletal ROS (+)    Abdominal   Peds  Hematology negative hematology ROS (+)   Anesthesia Other Findings Day of surgery medications reviewed with patient.  Reproductive/Obstetrics negative OB ROS                              Anesthesia Physical Anesthesia Plan  ASA: 3  Anesthesia Plan: General   Post-op Pain Management: Tylenol  PO (pre-op)* and Regional block*   Induction: Intravenous  PONV Risk Score and Plan: 2 and Treatment may vary due to age or medical condition, Ondansetron , Dexamethasone  and Midazolam   Airway Management Planned: LMA  Additional Equipment: None  Intra-op Plan:   Post-operative Plan: Extubation in OR  Informed Consent: I have reviewed the patients History and Physical, chart, labs and discussed the procedure including the risks, benefits and alternatives for the proposed anesthesia with the patient or authorized representative who has indicated his/her understanding and  acceptance.     Dental advisory given  Plan Discussed with: CRNA and Anesthesiologist  Anesthesia Plan Comments:         Anesthesia Quick Evaluation

## 2024-06-19 ENCOUNTER — Ambulatory Visit (HOSPITAL_BASED_OUTPATIENT_CLINIC_OR_DEPARTMENT_OTHER): Payer: Worker's Compensation | Admitting: Anesthesiology

## 2024-06-19 ENCOUNTER — Ambulatory Visit (HOSPITAL_BASED_OUTPATIENT_CLINIC_OR_DEPARTMENT_OTHER)
Admission: RE | Admit: 2024-06-19 | Discharge: 2024-06-19 | Disposition: A | Discharge status: Home / Self Care | Payer: Worker's Compensation | Attending: Orthopaedic Surgery | Admitting: Orthopaedic Surgery

## 2024-06-19 ENCOUNTER — Ambulatory Visit (HOSPITAL_BASED_OUTPATIENT_CLINIC_OR_DEPARTMENT_OTHER): Admitting: Physical Therapy

## 2024-06-19 ENCOUNTER — Other Ambulatory Visit: Payer: Self-pay

## 2024-06-19 ENCOUNTER — Encounter (HOSPITAL_BASED_OUTPATIENT_CLINIC_OR_DEPARTMENT_OTHER): Payer: Self-pay | Admitting: Orthopaedic Surgery

## 2024-06-19 ENCOUNTER — Encounter (HOSPITAL_BASED_OUTPATIENT_CLINIC_OR_DEPARTMENT_OTHER)
Admission: RE | Disposition: A | Discharge status: Home / Self Care | Payer: Self-pay | Source: Home / Self Care | Attending: Orthopaedic Surgery

## 2024-06-19 DIAGNOSIS — M7671 Peroneal tendinitis, right leg: Secondary | ICD-10-CM | POA: Diagnosis present

## 2024-06-19 DIAGNOSIS — M65971 Unspecified synovitis and tenosynovitis, right ankle and foot: Secondary | ICD-10-CM | POA: Insufficient documentation

## 2024-06-19 DIAGNOSIS — Z6841 Body Mass Index (BMI) 40.0 and over, adult: Secondary | ICD-10-CM | POA: Diagnosis not present

## 2024-06-19 DIAGNOSIS — Z87891 Personal history of nicotine dependence: Secondary | ICD-10-CM | POA: Insufficient documentation

## 2024-06-19 DIAGNOSIS — X58XXXA Exposure to other specified factors, initial encounter: Secondary | ICD-10-CM | POA: Diagnosis not present

## 2024-06-19 DIAGNOSIS — E66813 Obesity, class 3: Secondary | ICD-10-CM | POA: Insufficient documentation

## 2024-06-19 DIAGNOSIS — S86311A Strain of muscle(s) and tendon(s) of peroneal muscle group at lower leg level, right leg, initial encounter: Secondary | ICD-10-CM | POA: Diagnosis not present

## 2024-06-19 DIAGNOSIS — I10 Essential (primary) hypertension: Secondary | ICD-10-CM | POA: Diagnosis not present

## 2024-06-19 DIAGNOSIS — G4733 Obstructive sleep apnea (adult) (pediatric): Secondary | ICD-10-CM | POA: Insufficient documentation

## 2024-06-19 HISTORY — PX: ARTHROSCOPY, ANKLE WITH DEBRIDEMENT: SHX7318

## 2024-06-19 HISTORY — PX: TENDON TRANSFER: SHX6109

## 2024-06-19 SURGERY — ARTHROSCOPY, ANKLE WITH DEBRIDEMENT
Anesthesia: General | Site: Foot | Laterality: Right

## 2024-06-19 MED ORDER — FENTANYL CITRATE (PF) 100 MCG/2ML IJ SOLN
INTRAMUSCULAR | Status: AC
  Filled 2024-06-19: qty 2

## 2024-06-19 MED ORDER — MIDAZOLAM HCL 2 MG/2ML IJ SOLN
2.0000 mg | Freq: Once | INTRAMUSCULAR | Status: AC
  Administered 2024-06-19: 2 mg via INTRAVENOUS

## 2024-06-19 MED ORDER — CELECOXIB 200 MG PO CAPS: 200.0000 mg | ORAL_CAPSULE | Freq: Once | ORAL | Status: DC

## 2024-06-19 MED ORDER — DEXAMETHASONE SODIUM PHOSPHATE 10 MG/ML IJ SOLN
INTRAMUSCULAR | Status: DC | PRN
  Administered 2024-06-19: 10 mg via INTRAVENOUS

## 2024-06-19 MED ORDER — DEXAMETHASONE SODIUM PHOSPHATE 10 MG/ML IJ SOLN
INTRAMUSCULAR | Status: AC
  Filled 2024-06-19: qty 1

## 2024-06-19 MED ORDER — PROPOFOL 10 MG/ML IV BOLUS
INTRAVENOUS | Status: DC | PRN
  Administered 2024-06-19: 200 ug via INTRAVENOUS
  Administered 2024-06-19 (×3): 50 ug via INTRAVENOUS

## 2024-06-19 MED ORDER — LACTATED RINGERS IV SOLN
INTRAVENOUS | Status: DC
Start: 2024-06-19 — End: 2024-06-19

## 2024-06-19 MED ORDER — KETOROLAC TROMETHAMINE 30 MG/ML IJ SOLN
INTRAMUSCULAR | Status: AC
  Filled 2024-06-19: qty 1

## 2024-06-19 MED ORDER — CEFAZOLIN SODIUM-DEXTROSE 3-4 GM/150ML-% IV SOLN
3.0000 g | INTRAVENOUS | Status: AC
  Administered 2024-06-19: 3 g via INTRAVENOUS

## 2024-06-19 MED ORDER — ACETAMINOPHEN 500 MG PO TABS: 1000.0000 mg | ORAL_TABLET | Freq: Once | ORAL | Status: AC

## 2024-06-19 MED ORDER — SUGAMMADEX SODIUM 200 MG/2ML IV SOLN
INTRAVENOUS | Status: DC | PRN
Start: 2024-06-19 — End: 2024-06-19
  Administered 2024-06-19: 400 mg via INTRAVENOUS

## 2024-06-19 MED ORDER — FENTANYL CITRATE (PF) 100 MCG/2ML IJ SOLN: 25.0000 ug | INTRAMUSCULAR | Status: DC | PRN

## 2024-06-19 MED ORDER — ACETAMINOPHEN 500 MG PO TABS
1000.0000 mg | ORAL_TABLET | Freq: Once | ORAL | Status: AC
  Administered 2024-06-19: 1000 mg via ORAL

## 2024-06-19 MED ORDER — FENTANYL CITRATE (PF) 100 MCG/2ML IJ SOLN
INTRAMUSCULAR | Status: AC
Start: 2024-06-19 — End: 2024-06-19
  Filled 2024-06-19: qty 2

## 2024-06-19 MED ORDER — MIDAZOLAM HCL 2 MG/2ML IJ SOLN
INTRAMUSCULAR | Status: AC
  Filled 2024-06-19: qty 2

## 2024-06-19 MED ORDER — KETOROLAC TROMETHAMINE 30 MG/ML IJ SOLN
INTRAMUSCULAR | Status: DC | PRN
  Administered 2024-06-19: 30 mg via INTRAVENOUS

## 2024-06-19 MED ORDER — 0.9 % SODIUM CHLORIDE (POUR BTL) OPTIME
TOPICAL | Status: DC | PRN
  Administered 2024-06-19: 75 mL

## 2024-06-19 MED ORDER — ACETAMINOPHEN 500 MG PO TABS
ORAL_TABLET | ORAL | Status: AC
  Filled 2024-06-19: qty 2

## 2024-06-19 MED ORDER — ONDANSETRON HCL 4 MG/2ML IJ SOLN
INTRAMUSCULAR | Status: AC
  Filled 2024-06-19: qty 2

## 2024-06-19 MED ORDER — SODIUM CHLORIDE 0.9 % IR SOLN
Status: DC | PRN
Start: 2024-06-19 — End: 2024-06-19
  Administered 2024-06-19: 150 mL

## 2024-06-19 MED ORDER — HYDRALAZINE HCL 20 MG/ML IJ SOLN
INTRAMUSCULAR | Status: AC
  Filled 2024-06-19: qty 1

## 2024-06-19 MED ORDER — DEXMEDETOMIDINE HCL IN NACL 80 MCG/20ML IV SOLN
INTRAVENOUS | Status: DC | PRN
  Administered 2024-06-19: 8 ug via INTRAVENOUS

## 2024-06-19 MED ORDER — CEFAZOLIN SODIUM-DEXTROSE 3-4 GM/150ML-% IV SOLN
INTRAVENOUS | Status: AC
  Filled 2024-06-19: qty 150

## 2024-06-19 MED ORDER — FENTANYL CITRATE (PF) 100 MCG/2ML IJ SOLN
INTRAMUSCULAR | Status: DC | PRN
  Administered 2024-06-19: 100 ug via INTRAVENOUS

## 2024-06-19 MED ORDER — ONDANSETRON HCL 4 MG/2ML IJ SOLN
INTRAMUSCULAR | Status: DC | PRN
  Administered 2024-06-19: 4 mg via INTRAVENOUS

## 2024-06-19 MED ORDER — POVIDONE-IODINE 10 % EX SOLN
CUTANEOUS | Status: DC | PRN
  Administered 2024-06-19: 1 via TOPICAL

## 2024-06-19 MED ORDER — BUPIVACAINE LIPOSOME 1.3 % IJ SUSP
INTRAMUSCULAR | Status: DC | PRN
Start: 2024-06-19 — End: 2024-06-19
  Administered 2024-06-19: 10 mL via PERINEURAL

## 2024-06-19 MED ORDER — FENTANYL CITRATE (PF) 100 MCG/2ML IJ SOLN
100.0000 ug | Freq: Once | INTRAMUSCULAR | Status: AC
  Administered 2024-06-19: 100 ug via INTRAVENOUS

## 2024-06-19 MED ORDER — DROPERIDOL 2.5 MG/ML IJ SOLN: 0.6250 mg | Freq: Once | INTRAMUSCULAR | Status: DC | PRN

## 2024-06-19 MED ORDER — OXYCODONE HCL 5 MG/5ML PO SOLN: 5.0000 mg | Freq: Once | ORAL | Status: AC | PRN

## 2024-06-19 MED ORDER — OXYCODONE HCL 5 MG PO TABS
5.0000 mg | ORAL_TABLET | Freq: Once | ORAL | Status: AC | PRN
  Administered 2024-06-19: 5 mg via ORAL

## 2024-06-19 MED ORDER — VANCOMYCIN HCL 500 MG IV SOLR
INTRAVENOUS | Status: DC | PRN
  Administered 2024-06-19: 500 mg via TOPICAL

## 2024-06-19 MED ORDER — ROCURONIUM BROMIDE 100 MG/10ML IV SOLN
INTRAVENOUS | Status: DC | PRN
  Administered 2024-06-19: 60 mg via INTRAVENOUS

## 2024-06-19 MED ORDER — OXYCODONE HCL 5 MG PO TABS
ORAL_TABLET | ORAL | Status: AC
Start: 2024-06-19 — End: 2024-06-19
  Filled 2024-06-19: qty 1

## 2024-06-19 MED ORDER — LIDOCAINE 2% (20 MG/ML) 5 ML SYRINGE
INTRAMUSCULAR | Status: DC | PRN
  Administered 2024-06-19: 100 mg via INTRAVENOUS

## 2024-06-19 MED ORDER — CHLORHEXIDINE GLUCONATE 4 % EX SOLN: 60.0000 mL | Freq: Once | CUTANEOUS | Status: DC

## 2024-06-19 MED ORDER — LIDOCAINE 2% (20 MG/ML) 5 ML SYRINGE
INTRAMUSCULAR | Status: AC
  Filled 2024-06-19: qty 5

## 2024-06-19 MED ORDER — BUPIVACAINE HCL (PF) 0.5 % IJ SOLN
INTRAMUSCULAR | Status: DC | PRN
  Administered 2024-06-19: 15 mL via PERINEURAL

## 2024-06-19 SURGICAL SUPPLY — 55 items
BLADE SURG 15 STRL LF DISP TIS (BLADE) ×1 IMPLANT
BNDG COMPR ESMARK 6X3 LF (GAUZE/BANDAGES/DRESSINGS) IMPLANT
BNDG ELASTIC 4INX 5YD STR LF (GAUZE/BANDAGES/DRESSINGS) ×1 IMPLANT
BNDG ELASTIC 6INX 5YD STR LF (GAUZE/BANDAGES/DRESSINGS) IMPLANT
BNDG ELASTIC 6X10 VLCR STRL LF (GAUZE/BANDAGES/DRESSINGS) ×1 IMPLANT
BNDG GAUZE DERMACEA FLUFF 4 (GAUZE/BANDAGES/DRESSINGS) ×1 IMPLANT
BOOT STEPPER DURA LG (SOFTGOODS) IMPLANT
BOOT STEPPER DURA MED (SOFTGOODS) IMPLANT
BOOT STEPPER DURA SM (SOFTGOODS) IMPLANT
BURR OVAL 8 FLU 4.0X13 (MISCELLANEOUS) IMPLANT
CHLORAPREP W/TINT 26 (MISCELLANEOUS) IMPLANT
CUFF TRNQT CYL 34X4.125X (TOURNIQUET CUFF) IMPLANT
DISSECTOR 3.8MM X 13CM (MISCELLANEOUS) IMPLANT
DRAPE EXTREMITY T 121X128X90 (DISPOSABLE) ×1 IMPLANT
DRAPE OEC MINIVIEW 54X84 (DRAPES) IMPLANT
DRAPE U-SHAPE 47X51 STRL (DRAPES) ×1 IMPLANT
DRSG MEPITEL 4X7.2 (GAUZE/BANDAGES/DRESSINGS) ×1 IMPLANT
ELECTRODE REM PT RTRN 9FT ADLT (ELECTROSURGICAL) ×1 IMPLANT
EXCALIBUR 3.8MM X 13CM (MISCELLANEOUS) IMPLANT
GAUZE PAD ABD 8X10 STRL (GAUZE/BANDAGES/DRESSINGS) IMPLANT
GAUZE SPONGE 4X4 12PLY STRL (GAUZE/BANDAGES/DRESSINGS) ×1 IMPLANT
GLOVE BIOGEL PI IND STRL 8 (GLOVE) ×1 IMPLANT
GLOVE SURG SS PI 7.5 STRL IVOR (GLOVE) ×1 IMPLANT
GOWN STRL REUS W/ TWL LRG LVL3 (GOWN DISPOSABLE) ×1 IMPLANT
KIT FIBERTAK DX 1.6 DISP (KITS) IMPLANT
NDL SUT 6 .5 CRC .975X.05 MAYO (NEEDLE) IMPLANT
NS IRRIG 1000ML POUR BTL (IV SOLUTION) IMPLANT
PACK ARTHROSCOPY DSU (CUSTOM PROCEDURE TRAY) ×1 IMPLANT
PACK BASIN DAY SURGERY FS (CUSTOM PROCEDURE TRAY) ×1 IMPLANT
PAD CAST 4YDX4 CTTN HI CHSV (CAST SUPPLIES) ×1 IMPLANT
PENCIL SMOKE EVACUATOR (MISCELLANEOUS) IMPLANT
SANITIZER HAND ALTRA PUMP 550 (MISCELLANEOUS) ×1 IMPLANT
SCOPE NANONDL 125 (MISCELLANEOUS) IMPLANT
SCOPE NANONEEDLE 125 (MISCELLANEOUS) ×1 IMPLANT
SET IRRIG Y TYPE TUR BLADDER L (SET/KITS/TRAYS/PACK) ×1 IMPLANT
SHAVER DISSECTOR 3.0 (BURR) IMPLANT
SHAVER SABRE 2.0 (BURR) IMPLANT
SHEATH NANONDL HIGHFLOW 125 (SHEATH) IMPLANT
SHEATH NANONEEDLE HIGHFLOW 125 (SHEATH) ×1 IMPLANT
SLEEVE SCD COMPRESS KNEE MED (STOCKING) ×1 IMPLANT
SOL .9 NS 3000ML IRR UROMATIC (IV SOLUTION) ×1 IMPLANT
SPLINT PLASTER CAST FAST 5X30 (CAST SUPPLIES) IMPLANT
SPONGE T-LAP 18X18 ~~LOC~~+RFID (SPONGE) ×1 IMPLANT
STOCKINETTE 6 STRL (DRAPES) IMPLANT
STRAP ANKLE FOOT DISTRACTOR (ORTHOPEDIC SUPPLIES) IMPLANT
SUCTION TUBE FRAZIER 10FR DISP (SUCTIONS) ×1 IMPLANT
SUT ETHILON 2 0 FS 18 (SUTURE) ×1 IMPLANT
SUT VIC AB 0 CT1 27XBRD ANBCTR (SUTURE) IMPLANT
SUT VIC AB 2-0 CT1 TAPERPNT 27 (SUTURE) IMPLANT
SUT VIC AB 3-0 SH 27X BRD (SUTURE) IMPLANT
SYR BULB EAR ULCER 3OZ GRN STR (SYRINGE) ×1 IMPLANT
TOWEL GREEN STERILE FF (TOWEL DISPOSABLE) ×1 IMPLANT
TUBE CONNECTING 20X1/4 (TUBING) IMPLANT
TUBING ARTHROSCOPY IRRIG 16FT (MISCELLANEOUS) ×1 IMPLANT
WAND ABLATOR APOLLO I90 (BUR) IMPLANT

## 2024-06-19 NOTE — H&P (Signed)

## 2024-06-19 NOTE — Op Note (Signed)
 06/19/2024  4:42 PM   PATIENT: Shane Erickson  48 y.o. male  MRN: [id number]   PRE-OPERATIVE DIAGNOSIS:   Peroneal tendinitis, right leg   POST-OPERATIVE DIAGNOSIS:   Peroneal tendinitis, right leg   PROCEDURE: 1] Right ankle arthroscopic assisted debridement 2] Right peroneus brevis tendon debridement with tenosynovectomy and primary repair 3] Right peroneus longus tendon debridement with tenosynovectomy   SURGEON:  Lillia Mountain, MD   ASSISTANT: None   ANESTHESIA: General, regional   EBL: Minimal   TOURNIQUET:    Total Tourniquet Time Documented: Thigh (Right) - 36 minutes Total: Thigh (Right) - 36 minutes    COMPLICATIONS: None apparent   DISPOSITION: Extubated, awake and stable to recovery.   INDICATION FOR PROCEDURE: The patient presented with above diagnosis.  We discussed the diagnosis, alternative treatment options, risks and benefits of the above surgical intervention, as well as alternative non-operative treatments. All questions/concerns were addressed and the patient/family demonstrated appropriate understanding of the diagnosis, the procedure, the postoperative course, and overall prognosis. The patient wished to proceed with surgical intervention and signed an informed surgical consent as such, in each others presence prior to surgery.   PROCEDURE IN DETAIL: After preoperative consent was obtained and the correct operative site was identified, the patient was brought to the operating room supine on stretcher and transferred onto operating table. General anesthesia was induced. Preoperative antibiotics were administered. Surgical timeout was taken. The patient was then positioned supine with an ipsilateral hip bump. The operative lower extremity was prepped and draped in standard sterile fashion with a tourniquet around the thigh. The extremity was exsanguinated and the tourniquet was inflated to 275 mmHg.  Routine evaluation of the ankle joint  demonstrated gross lateral laxity with drawer testing. Full dorsiflexion as well as plantarflexion was possible.   We began by insufflating the ankle joint thru anteromedial approach. The anteromedial portal was carefully established medial to the tibialis anterior tendon. The arthroscopic trochar with blunt was inserted and then camera placed. There was excellent visualization of the joint and routine diagnostic ankle arthroscopy was performed. Of note, there was mild synovitis throughout the joint noted. Mild chondral changes in the tibiotalar joint surfaces. No loose bodies were encountered and no anterior ankle impingement was identified on max dorsiflexion. The lateral ankle ligament complex, deltoid and syndesmosis ligaments were stressed and noted to be stable. Arthroscopic assisted debridement of the ankle joint was performed.    A standard curvilinear approach was made over the lateral ankle ligament complex and the distal lateral malleolus.    We began by windowing the approach posteriorly to access the peroneal tendons. The sheath was incised and tenosynovial fluid was readily evacuated. We then sequentially evaluated both the peroneus longus and brevis tendons with tearing noted in both. We did note extensive tenosynovitis that was debrided thoroughly. The peroneus brevis was repaired using vicryl suture. The peroneus longus tear constituted <20% of the tendon so this portion was excised. There was no instability of peroneal tendons to intraoperative stress testing. The peroneal tendon sheath was closed with vicryl.    The surgical sites were thoroughly irrigated. The tourniquet was deflated and hemostasis achieved. Betadine  and vancomycin  powder were applied. The deep layers were closed using 2-0 vicryl. The skin was closed without tension using 2-0 nylon suture.    The leg was cleaned with saline and sterile dressings with gauze were applied. A well padded CAM boot was applied. The patient  was awakened from anesthesia and transported to the recovery  room in stable condition.    FOLLOW UP PLAN: -transfer to PACU, then home -strict NWB operative extremity, maximum elevation -maintain dry dressings until follow up -DVT ppx: Aspirin 81 mg twice daily while NWB -follow up as outpatient within 7-10 days for wound check with exchange of short leg splint to short leg cast -sutures out in 2-3 weeks in outpatient office   RADIOGRAPHS: None today   Lillia Mountain [facility]

## 2024-06-19 NOTE — Anesthesia Procedure Notes (Signed)
 Anesthesia Regional Block: Popliteal block   Pre-Anesthetic Checklist: , timeout performed,  Correct Patient, Correct Site, Correct Laterality,  Correct Procedure, Correct Position, site marked,  Risks and benefits discussed,  Surgical consent,  Pre-op evaluation,  At surgeon's request and post-op pain management  Laterality: Right  Prep: chloraprep       Needles:  Injection technique: Single-shot  Needle Type: Echogenic Stimulator Needle     Needle Length: 5cm  Needle Gauge: 22     Additional Needles:   Procedures:, nerve stimulator,,, ultrasound used (permanent image in chart),,     Nerve Stimulator or Paresthesia:  Response: foot, 0.45 mA  Additional Responses:   Narrative:  Start time: 06/19/2024 9:00 AM End time: 06/19/2024 9:10 AM Injection made incrementally with aspirations every 5 mL.  Performed by: Personally  Anesthesiologist: Mallory Manus, MD  Additional Notes: Functioning IV was confirmed and monitors were applied.  A 50mm 22ga Arrow echogenic stimulator needle was used. Sterile prep and drape,hand hygiene and sterile gloves were used. Ultrasound guidance: relevant anatomy identified, needle position confirmed, local anesthetic spread visualized around nerve(s)., vascular puncture avoided.  Image printed for medical record. Negative aspiration and negative test dose prior to incremental administration of local anesthetic. The patient tolerated the procedure well.

## 2024-06-19 NOTE — Progress Notes (Signed)
Assisted Dr. Oddono with right, popliteal, ultrasound guided block. Side rails up, monitors on throughout procedure. See vital signs in flow sheet. Tolerated Procedure well. 

## 2024-06-19 NOTE — Anesthesia Procedure Notes (Signed)
 Procedure Name: Intubation Date/Time: 06/19/2024 11:36 AM  Performed by: Frost Kayla MATSU, CRNAPre-anesthesia Checklist: Patient identified, Emergency Drugs available, Suction available and Patient being monitored Patient Re-evaluated:Patient Re-evaluated prior to induction Oxygen Delivery Method: Circle system utilized Preoxygenation: Pre-oxygenation with 100% oxygen Induction Type: IV induction Ventilation: Mask ventilation without difficulty Laryngoscope Size: Miller and 3 Grade View: Grade I Tube type: Oral Tube size: 7.5 mm Number of attempts: 1 Airway Equipment and Method: Stylet and Oral airway Placement Confirmation: ETT inserted through vocal cords under direct vision, positive ETCO2 and breath sounds checked- equal and bilateral Secured at: 23 cm Tube secured with: Tape Dental Injury: Teeth and Oropharynx as per pre-operative assessment

## 2024-06-19 NOTE — Anesthesia Postprocedure Evaluation (Signed)
 Anesthesia Post Note  Patient: Shane Erickson  Procedure(s) Performed: ARTHROSCOPY, ANKLE WITH DEBRIDEMENT (Right: Foot) TRANSFER, TENDON (Right: Foot)     Patient location during evaluation: PACU Anesthesia Type: General Level of consciousness: awake and alert Pain management: pain level controlled Vital Signs Assessment: post-procedure vital signs reviewed and stable Respiratory status: spontaneous breathing, nonlabored ventilation and respiratory function stable Cardiovascular status: blood pressure returned to baseline Postop Assessment: no apparent nausea or vomiting Anesthetic complications: no   No notable events documented.  Last Vitals:  Vitals:   06/19/24 1330 06/19/24 1346  BP: (!) 167/121 (!) 173/116  Pulse: 83   Resp: 14 16  Temp:  (!) 36.3 C  SpO2: 94% 96%    Last Pain:  Vitals:   06/19/24 1400  TempSrc:   PainSc: 3                  Vertell Row

## 2024-06-19 NOTE — Transfer of Care (Signed)
 Immediate Anesthesia Transfer of Care Note  Patient: Shane Erickson  Procedure(s) Performed: ARTHROSCOPY, ANKLE WITH DEBRIDEMENT (Right: Foot) TRANSFER, TENDON (Right: Foot)  Patient Location: PACU  Anesthesia Type:GA combined with regional for post-op pain  Level of Consciousness: drowsy and responds to stimulation  Airway & Oxygen Therapy: Patient Spontanous Breathing and Patient connected to face mask oxygen  Post-op Assessment: Report given to RN and Post -op Vital signs reviewed and stable  Post vital signs: Reviewed and stable  Last Vitals:  Vitals Value Taken Time  BP    Temp    Pulse    Resp    SpO2      Last Pain:  Vitals:   06/19/24 0737  PainSc: 6       Patients Stated Pain Goal: 6 (06/19/24 0737)  Complications: No notable events documented.

## 2024-06-20 ENCOUNTER — Encounter (HOSPITAL_BASED_OUTPATIENT_CLINIC_OR_DEPARTMENT_OTHER): Payer: Self-pay | Admitting: Orthopaedic Surgery

## 2024-06-20 NOTE — Addendum Note (Signed)
 Addendum  created 06/20/24 0948 by Mallory Manus, MD   Clinical Note Signed, Intraprocedure Blocks edited, SmartForm saved

## 2024-06-21 ENCOUNTER — Ambulatory Visit (HOSPITAL_BASED_OUTPATIENT_CLINIC_OR_DEPARTMENT_OTHER): Admitting: Physical Therapy

## 2024-06-26 ENCOUNTER — Ambulatory Visit (HOSPITAL_BASED_OUTPATIENT_CLINIC_OR_DEPARTMENT_OTHER): Admitting: Physical Therapy

## 2024-06-28 ENCOUNTER — Ambulatory Visit (HOSPITAL_BASED_OUTPATIENT_CLINIC_OR_DEPARTMENT_OTHER): Admitting: Physical Therapy

## 2024-07-28 ENCOUNTER — Emergency Department (HOSPITAL_COMMUNITY)
Admission: EM | Admit: 2024-07-28 | Discharge: 2024-07-28 | Disposition: A | Discharge status: Home / Self Care | Attending: Emergency Medicine | Admitting: Emergency Medicine

## 2024-07-28 ENCOUNTER — Other Ambulatory Visit: Payer: Self-pay

## 2024-07-28 ENCOUNTER — Encounter (HOSPITAL_COMMUNITY): Payer: Self-pay | Admitting: Emergency Medicine

## 2024-07-28 ENCOUNTER — Emergency Department (HOSPITAL_COMMUNITY)

## 2024-07-28 DIAGNOSIS — I10 Essential (primary) hypertension: Secondary | ICD-10-CM | POA: Insufficient documentation

## 2024-07-28 DIAGNOSIS — Z87891 Personal history of nicotine dependence: Secondary | ICD-10-CM | POA: Insufficient documentation

## 2024-07-28 DIAGNOSIS — K6289 Other specified diseases of anus and rectum: Secondary | ICD-10-CM | POA: Diagnosis present

## 2024-07-28 LAB — COMPREHENSIVE METABOLIC PANEL WITH GFR
ALT: 54 U/L — ABNORMAL HIGH (ref 0–44)
AST: 30 U/L (ref 15–41)
Albumin: 4 g/dL (ref 3.5–5.0)
Alkaline Phosphatase: 57 U/L (ref 38–126)
Anion gap: 15 (ref 5–15)
BUN: 23 mg/dL — ABNORMAL HIGH (ref 6–20)
CO2: 23 mmol/L (ref 22–32)
Calcium: 9.2 mg/dL (ref 8.9–10.3)
Chloride: 103 mmol/L (ref 98–111)
Creatinine, Ser: 1.5 mg/dL — ABNORMAL HIGH (ref 0.61–1.24)
GFR, Estimated: 58 mL/min — ABNORMAL LOW (ref >60)
Glucose, Bld: 233 mg/dL — ABNORMAL HIGH (ref 70–99)
Potassium: 3.4 mmol/L — ABNORMAL LOW (ref 3.5–5.1)
Sodium: 141 mmol/L (ref 135–145)
Total Bilirubin: 0.5 mg/dL (ref 0.0–1.2)
Total Protein: 7.3 g/dL (ref 6.5–8.1)

## 2024-07-28 LAB — CBC
HCT: 45.4 % (ref 39.0–52.0)
Hemoglobin: 15.1 g/dL (ref 13.0–17.0)
MCH: 28.9 pg (ref 26.0–34.0)
MCHC: 33.3 g/dL (ref 30.0–36.0)
MCV: 87 fL (ref 80.0–100.0)
Platelets: 376 K/uL (ref 150–400)
RBC: 5.22 MIL/uL (ref 4.22–5.81)
RDW: 14.7 % (ref 11.5–15.5)
WBC: 15.2 K/uL — ABNORMAL HIGH (ref 4.0–10.5)
nRBC: 0 % (ref 0.0–0.2)

## 2024-07-28 MED ORDER — SODIUM CHLORIDE 0.9 % IV BOLUS
1000.0000 mL | Freq: Once | INTRAVENOUS | Status: AC
  Administered 2024-07-28: 1000 mL via INTRAVENOUS

## 2024-07-28 MED ORDER — KETOROLAC TROMETHAMINE 15 MG/ML IJ SOLN
15.0000 mg | Freq: Once | INTRAMUSCULAR | Status: AC
  Administered 2024-07-28: 15 mg via INTRAVENOUS
  Filled 2024-07-28: qty 1

## 2024-07-28 MED ORDER — HYDROCODONE-ACETAMINOPHEN 5-325 MG PO TABS: 2.0000 | ORAL_TABLET | ORAL | 0 refills | Status: DC | PRN

## 2024-07-28 MED ORDER — MORPHINE SULFATE (PF) 4 MG/ML IV SOLN
4.0000 mg | Freq: Once | INTRAVENOUS | Status: AC
  Administered 2024-07-28: 4 mg via INTRAVENOUS
  Filled 2024-07-28: qty 1

## 2024-07-28 MED ORDER — POLYETHYLENE GLYCOL 3350 17 G PO PACK: 17.0000 g | PACK | Freq: Every day | ORAL | 1 refills | Status: AC

## 2024-07-28 MED ORDER — IOHEXOL 300 MG/ML  SOLN
100.0000 mL | Freq: Once | INTRAMUSCULAR | Status: AC | PRN
  Administered 2024-07-28: 100 mL via INTRAVENOUS

## 2024-07-28 MED ORDER — LIDOCAINE (ANORECTAL) 5 % EX CREA: TOPICAL_CREAM | CUTANEOUS | 0 refills | Status: DC

## 2024-07-28 NOTE — ED Triage Notes (Signed)
 Pt here for c/ hemorrhoid and also states his BP got up real high. Reports BP of 163/120.

## 2024-07-28 NOTE — ED Provider Notes (Signed)
 [facility]-EMERGENCY DEPT [facility] Emergency Department Provider Note MRN:  [id number]  Arrival date & time: 07/28/24     Chief Complaint   Hypertension and Hemorrhoids   History of Present Illness   Shane Erickson is a 48 y.o. year-old male with a history of hypertension, anal fissure presenting to the ED with chief complaint of hemorrhoids.  Hemorrhoidal pain for the past 2 months, not going away, getting worse.  Denies abdominal pain.  Review of Systems  A thorough review of systems was obtained and all systems are negative except as noted in the HPI and PMH.   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 x 2 yr.   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    Past Surgical History:  Procedure Laterality Date   ARTHROSCOPY, ANKLE WITH DEBRIDEMENT Right 06/19/2024   Procedure: ARTHROSCOPY, ANKLE WITH DEBRIDEMENT;  Surgeon: Barton Drape, MD;  Location: [facility];  Service: Orthopedics;  Laterality: Right;  right ankle arthroscopy with peroneal tendons debridement with possible repair and/or transfer   MENISCUS REPAIR Left 03/22/2022   SHOULDER ARTHROSCOPY WITH DISTAL CLAVICLE RESECTION Right 02/09/2024   Procedure: SHOULDER ARTHROSCOPY WITH DISTAL CLAVICLE RESECTION;  Surgeon: Sharl Selinda Dover, MD;  Location: [facility];  Service: Orthopedics;  Laterality: Right;   SHOULDER ARTHROSCOPY WITH SUBACROMIAL DECOMPRESSION Right 02/09/2024   Procedure: SHOULDER ARTHROSCOPY WITH SUBACROMIAL DECOMPRESSION;  Surgeon: Sharl Selinda Dover, MD;  Location: [facility];  Service: Orthopedics;  Laterality: Right;   SPHINCTEROTOMY N/A 04/07/2016   Procedure: LATERAL INTERNAL SPHINCTEROTOMY;  Surgeon: Donnice Lunger, MD;  Location: [facility];  Service:  General;  Laterality: N/A;   TENDON TRANSFER Right 06/19/2024   Procedure: TRANSFER, TENDON;  Surgeon: Barton Drape, MD;  Location:  SURGERY CENTER;  Service: Orthopedics;  Laterality: Right;   UMBILICAL HERNIA REPAIR  12/05/2010    Family History  Problem Relation Age of Onset   Kidney disease Father    CVA Father    Heart attack Father    Hypertension Father    CAD Father    Thyroid disease Mother    Multiple sclerosis Sister    CAD Paternal Uncle     Social History   Socioeconomic History   Marital status: Married    Spouse name: Not on file   Number of children: Not on file   Years of education: Not on file   Highest education level: Not on file  Occupational History   Occupation: truck driver  Tobacco Use   Smoking status: Former    Types: E-cigarettes    Passive exposure: Past   Smokeless tobacco: Never  Vaping Use   Vaping status: Never Used  Substance and Sexual Activity   Alcohol use: Yes    Comment: rarely   Drug use: No   Sexual activity: Never  Other Topics Concern   Not on file  Social History Narrative   Not on file   Social Drivers of Health   Financial Resource Strain: Not on file  Food Insecurity: Not on file  Transportation Needs: Not on file  Physical Activity: Not on file  Stress: Not on file  Social Connections: Not on file  Intimate Partner Violence: Not on file  Physical Exam   Vitals:   07/28/24 0350 07/28/24 0352  BP:  124/80  Pulse: 80 83  Resp:  (!) 22  Temp:    SpO2: 98% 96%    CONSTITUTIONAL: Well-appearing, NAD NEURO/PSYCH:  Alert and oriented x 3, no focal deficits EYES:  eyes equal and reactive ENT/NECK:  no LAD, no JVD CARDIO: Tachycardic rate, well-perfused, normal S1 and S2 PULM:  CTAB no wheezing or rhonchi GI/GU:  non-distended, non-tender MSK/SPINE:  No gross deformities, no edema SKIN:  no rash, atraumatic   *Additional and/or pertinent findings included in MDM below  Diagnostic and  Interventional Summary    EKG Interpretation Date/Time:    Ventricular Rate:    PR Interval:    QRS Duration:    QT Interval:    QTC Calculation:   R Axis:      Text Interpretation:         Labs Reviewed  CBC - Abnormal; Notable for the following components:      Result Value   WBC 15.2 (*)    All other components within normal limits  COMPREHENSIVE METABOLIC PANEL WITH GFR - Abnormal; Notable for the following components:   Potassium 3.4 (*)    Glucose, Bld 233 (*)    BUN 23 (*)    Creatinine, Ser 1.50 (*)    ALT 54 (*)    GFR, Estimated 58 (*)    All other components within normal limits    CT PELVIS W CONTRAST  Final Result      Medications  sodium chloride  0.9 % bolus 1,000 mL (0 mLs Intravenous Stopped 07/28/24 0349)  morphine  (PF) 4 MG/ML injection 4 mg (4 mg Intravenous Given 07/28/24 0109)  ketorolac  (TORADOL ) 15 MG/ML injection 15 mg (15 mg Intravenous Given 07/28/24 0105)  iohexol  (OMNIPAQUE ) 300 MG/ML solution 100 mL (100 mLs Intravenous Contrast Given 07/28/24 0136)     Procedures  /  Critical Care Procedures  ED Course and Medical Decision Making  Initial Impression and Ddx Duration of patient's pain is suspicious.  Does not have a significant thrombosed hemorrhoid that would explain the amount of pain/tenderness.  Concern for abscess, awaiting CT.    Past medical/surgical history that increases complexity of ED encounter: None  Interpretation of Diagnostics I personally reviewed the Laboratory Testing and my interpretation is as follows: Leukocytosis, otherwise no significant blood count or electrolyte disturbance.  CT unremarkable  Patient Reassessment and Ultimate Disposition/Management     Patient looks well on reassessment, tachycardia resolved, no obvious signs of cellulitis on exam, no abscess on the CT.  Will refer to general surgery, provide further pain control.  Patient management required discussion with the following services or  consulting groups:  None  Complexity of Problems Addressed Acute illness or injury that poses threat of life of bodily function  Additional Data Reviewed and Analyzed Further history obtained from: Prior labs/imaging results  Additional Factors Impacting ED Encounter Risk Prescriptions and Consideration of hospitalization  Ozell HERO. Theadore, MD [facility] Emergency Medicine [facility] [email or web address]  Final Clinical Impressions(s) / ED Diagnoses     ICD-10-CM   1. Anal or rectal pain  K62.89       ED Discharge Orders          Ordered    HYDROcodone -acetaminophen  (NORCO/VICODIN) 5-325 MG tablet  Every 4 hours PRN        07/28/24 0356    Lidocaine , Anorectal, (HEMORRHOIDAL RELIEF) 5 % CREA  07/28/24 0357    polyethylene glycol (MIRALAX ) 17 g packet  Daily        07/28/24 0357             Discharge Instructions Discussed with and Provided to Patient:     Discharge Instructions      You were evaluated in the Emergency Department and after careful evaluation, we did not find any emergent condition requiring admission or further testing in the hospital.  Your exam/testing today was overall reassuring.  Symptoms may be due to hemorrhoids or an anal fissure.  Use the MiraLAX  to keep your stool soft.  Use the numbing medicine as needed.  Use the Norco for pain keeping you from sleeping.  Follow-up with the general surgeon.  Please return to the Emergency Department if you experience any worsening of your condition.  Thank you for allowing us  to be a part of your care.        Theadore Ozell HERO, MD 07/28/24 [phone]

## 2024-07-28 NOTE — Discharge Instructions (Addendum)
 You were evaluated in the Emergency Department and after careful evaluation, we did not find any emergent condition requiring admission or further testing in the hospital.  Your exam/testing today was overall reassuring.  Symptoms may be due to hemorrhoids or an anal fissure.  Use the MiraLAX  to keep your stool soft.  Use the numbing medicine as needed.  Use the Norco for pain keeping you from sleeping.  Follow-up with the general surgeon.  Please return to the Emergency Department if you experience any worsening of your condition.  Thank you for allowing us  to be a part of your care.

## 2024-07-30 ENCOUNTER — Encounter: Payer: Self-pay | Admitting: Internal Medicine

## 2024-08-05 ENCOUNTER — Encounter (HOSPITAL_COMMUNITY): Payer: Self-pay | Admitting: Emergency Medicine

## 2024-08-05 ENCOUNTER — Other Ambulatory Visit: Payer: Self-pay

## 2024-08-05 ENCOUNTER — Emergency Department (HOSPITAL_COMMUNITY)
Admission: EM | Admit: 2024-08-05 | Discharge: 2024-08-05 | Disposition: A | Discharge status: Home / Self Care | Attending: Emergency Medicine | Admitting: Emergency Medicine

## 2024-08-05 DIAGNOSIS — K602 Anal fissure, unspecified: Secondary | ICD-10-CM | POA: Diagnosis not present

## 2024-08-05 DIAGNOSIS — K6289 Other specified diseases of anus and rectum: Secondary | ICD-10-CM | POA: Diagnosis present

## 2024-08-05 MED ORDER — OXYCODONE-ACETAMINOPHEN 5-325 MG PO TABS
1.0000 | ORAL_TABLET | Freq: Four times a day (QID) | ORAL | 0 refills | Status: AC | PRN
Start: 2024-08-05 — End: ?

## 2024-08-05 MED ORDER — NITROGLYCERIN 2 % TD OINT
0.5000 [in_us] | TOPICAL_OINTMENT | Freq: Once | TRANSDERMAL | Status: AC
  Administered 2024-08-05: 0.5 [in_us] via TOPICAL
  Filled 2024-08-05: qty 1

## 2024-08-05 MED ORDER — OXYCODONE-ACETAMINOPHEN 5-325 MG PO TABS
2.0000 | ORAL_TABLET | Freq: Once | ORAL | Status: AC
  Administered 2024-08-05: 2 via ORAL
  Filled 2024-08-05: qty 2

## 2024-08-05 MED ORDER — NIFEDIPINE 0.3 % OINTMENT: 1.0000 | TOPICAL_OINTMENT | Freq: Four times a day (QID) | CUTANEOUS | 0 refills | Status: AC

## 2024-08-05 NOTE — ED Provider Notes (Signed)
 [place] EMERGENCY DEPARTMENT AT [facility] Provider Note   CSN: [id number] Arrival date & time: 08/05/24  1843     Patient presents with: Hemorrhoids   ANDRAE Erickson is a 48 y.o. male.  He is here with a complaint of severe rectal pain.  Symptoms been going on for a few weeks.  He was seen here a week ago and treated with pain medicine topical lidocaine  stool softeners.  Had a CAT scan that did not show any acute findings.  Has an appointment with general surgery next week.  Pain became more severe today.  Not having any constipation.  Still doing sitz bath's.  {Add pertinent medical, surgical, social history, OB history to YEP:67052} The history is provided by the patient.       Prior to Admission medications   Medication Sig Start Date End Date Taking? Authorizing Provider  amLODipine (NORVASC) 10 MG tablet Take 1 tablet by mouth daily.    [provider]  cyclobenzaprine  (FLEXERIL ) 10 MG tablet Take 10 mg by mouth 3 (three) times daily as needed for muscle spasms.    [provider]  gabapentin (NEURONTIN) 300 MG capsule Take 300 mg by mouth 3 (three) times daily.    [provider]  HYDROcodone -acetaminophen  (NORCO/VICODIN) 5-325 MG tablet Take 2 tablets by mouth every 4 (four) hours as needed. 07/28/24   Theadore Ozell HERO, MD  hydrocortisone  (ANUSOL -HC) 2.5 % rectal cream Place 1 Application rectally 2 (two) times daily. 06/17/24   Cleotilde Rogue, MD  hydrocortisone  (ANUSOL -HC) 25 MG suppository Place 1 suppository (25 mg total) rectally 2 (two) times daily. 06/17/24   Cleotilde Rogue, MD  Lidocaine , Anorectal, (HEMORRHOIDAL RELIEF) 5 % CREA Apply to painful hemorrhoid every 4 hours as needed. 07/28/24   Theadore Ozell HERO, MD  ondansetron  (ZOFRAN -ODT) 4 MG disintegrating tablet Take 1 tablet (4 mg total) by mouth every 8 (eight) hours as needed for nausea or vomiting. 02/09/24   Sharl Selinda Dover, MD  OXcarbazepine (TRILEPTAL) 150 MG tablet Take by  mouth. 11/11/20   [provider]  oxyCODONE  (ROXICODONE ) 5 MG immediate release tablet Take 1 tablet (5 mg total) by mouth every 4 (four) hours as needed for moderate pain (pain score 4-6) or severe pain (pain score 7-10). 02/09/24 02/08/25  Sharl Selinda Dover, MD  polyethylene glycol (MIRALAX ) 17 g packet Take 17 g by mouth daily. 07/28/24   Theadore Ozell HERO, MD  traMADol (ULTRAM) 50 MG tablet Take 1 tablet every 6 hours by oral route as needed, for moderate pain. 10/24/23   [provider]  traZODone (DESYREL) 50 MG tablet Take 50-100 mg by mouth at bedtime as needed. Patient not taking: Reported on 02/27/2024    [provider]  UNABLE TO FIND Not applicable, 0 Refill(s) 07/13/18   [provider]    Allergies: Chapstick, Sunscreens, Pork-derived products, and Soap    Review of Systems  Constitutional:  Negative for fever.  HENT:  Negative for sore throat.   Respiratory:  Negative for shortness of breath.   Cardiovascular:  Negative for chest pain.  Gastrointestinal:  Positive for rectal pain. Negative for abdominal pain.  Genitourinary:  Negative for dysuria.  Skin:  Negative for rash.    Updated Vital Signs BP (!) 153/99 (BP Location: Right Arm)   Pulse (!) 108   Temp 99.6 F (37.6 C) (Oral)   Resp 16   Ht 6' (1.829 m)   Wt (!) 139.7 kg   SpO2 95%  BMI 41.77 kg/m   Physical Exam Vitals and nursing note reviewed.  Constitutional:      Appearance: Normal appearance. He is well-developed.  HENT:     Head: Normocephalic and atraumatic.  Eyes:     Conjunctiva/sclera: Conjunctivae normal.  Cardiovascular:     Rate and Rhythm: Normal rate and regular rhythm.  Pulmonary:     Effort: Pulmonary effort is normal.  Abdominal:     Tenderness: There is no abdominal tenderness. There is no guarding or rebound.  Genitourinary:    Comments: He has no obvious external hemorrhoids.  He has severe rectal pain.  Do not feel a distinct abscess  though. Musculoskeletal:     Cervical back: Neck supple.  Skin:    General: Skin is warm and dry.  Neurological:     Mental Status: He is alert.     GCS: GCS eye subscore is 4. GCS verbal subscore is 5. GCS motor subscore is 6.     (all labs ordered are listed, but only abnormal results are displayed) Labs Reviewed - No data to display  EKG: None  Radiology: No results found.  {Document cardiac monitor, telemetry assessment procedure when appropriate:32947} Procedures   Medications Ordered in the ED - No data to display    {Click here for ABCD2, HEART and other calculators REFRESH Note before signing:1}                              Medical Decision Making  This patient complains of ***; this involves an extensive number of treatment Options and is a complaint that carries with it a high risk of complications and morbidity. The differential includes ***  I ordered, reviewed and interpreted labs, which included *** I ordered medication *** and reviewed PMP when indicated. I ordered imaging studies which included *** and I independently    visualized and interpreted imaging which showed *** Additional history obtained from *** Previous records obtained and reviewed *** I consulted *** and discussed lab and imaging findings and discussed disposition.  Cardiac monitoring reviewed, *** Social determinants considered, *** Critical Interventions: ***  After the interventions stated above, I reevaluated the patient and found *** Admission and further testing considered, ***   {Document critical care time when appropriate  Document review of labs and clinical decision tools ie CHADS2VASC2, etc  Document your independent review of radiology images and any outside records  Document your discussion with family members, caretakers and with consultants  Document social determinants of health affecting pt's care  Document your decision making why or why not admission, treatments  were needed:32947:::1}   Final diagnoses:  None    ED Discharge Orders     None

## 2024-08-05 NOTE — Discharge Instructions (Signed)
 Please use the nifedipine  ointment 4 times a day topically.  Follow-up with surgery as scheduled.  Return if any worsening or concerning symptoms

## 2024-08-05 NOTE — ED Triage Notes (Signed)
 Pt to the ED with complaints of a hemorrhoid and concerns of a fistula.  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

## 2024-08-12 ENCOUNTER — Other Ambulatory Visit: Payer: Self-pay

## 2024-08-12 ENCOUNTER — Ambulatory Visit (INDEPENDENT_AMBULATORY_CARE_PROVIDER_SITE_OTHER): Admitting: Surgery

## 2024-08-12 ENCOUNTER — Encounter: Payer: Self-pay | Admitting: Surgery

## 2024-08-12 ENCOUNTER — Encounter
Admission: RE | Admit: 2024-08-12 | Discharge: 2024-08-12 | Disposition: A | Discharge status: Home / Self Care | Source: Ambulatory Visit | Attending: Surgery | Admitting: Surgery

## 2024-08-12 ENCOUNTER — Telehealth: Payer: Self-pay | Admitting: Surgery

## 2024-08-12 VITALS — BP 155/117 | HR 111 | Temp 98.6°F | Ht 72.0 in | Wt 293.0 lb

## 2024-08-12 DIAGNOSIS — K602 Anal fissure, unspecified: Secondary | ICD-10-CM

## 2024-08-12 HISTORY — DX: Obesity, unspecified: E66.9

## 2024-08-12 HISTORY — DX: Nonalcoholic steatohepatitis (NASH): K75.81

## 2024-08-12 HISTORY — DX: Prediabetes: R73.03

## 2024-08-12 HISTORY — DX: Obstructive sleep apnea (adult) (pediatric): G47.33

## 2024-08-12 MED ORDER — ONABOTULINUMTOXINA 100 UNITS IJ SOLR: 100.0000 [IU] | Freq: Once | INTRAMUSCULAR | Status: DC

## 2024-08-12 NOTE — Telephone Encounter (Signed)
 Patient has been advised of Pre-Admission date/time, and Surgery date at [facility]. This case is add on for next day.    Patient is called and informed to arrive @8 :00 am  on 08/13/24.  He is given directions to the hospital and informed to be NPO after midnight.  Patient verbalized understanding. Surgery Date: 08/13/24

## 2024-08-12 NOTE — H&P (View-Only) (Signed)
 Patient ID: Shane Erickson, male   DOB: 06-Oct-1976, 48 y.o.   MRN: [id number]  HPI Shane Erickson is a 48 y.o. male seen in consultation at the request of Dr. Dionisio.  For the last several weeks has had significant anorectal pain worsening when he has a bowel movement.  He reports that he has significant pain itching the pain is moderate to severe sharp in nature and worsening when he have a bowel movement.  Some occasional hematochezia.  No fevers no chills.  He has had multiple orthopedic surgeries and had recent Right ankle surgery. He did have a lateral internal sphincterotomy by Dr. Gladis in [place] 8 years ago or so.  He reports that the symptoms are very similar to what he was experiences at that time.  He also reports that the sphincterotomy had good relief. He has already tried multiple remedies including Preparation H, Anusol , nitroglycerin  cream and nifedipine  cream.  He is currently on nifedipine  cream. Did have a recent CT of the pelvis that I personally reviewed showing no evidence of any anorectal abscess,  he did have diverticulosis   HPI  Past Medical History:  Diagnosis Date   Anal fissure 03/05/2016   Hypertension    states under control with med., has been on med. x 2 yr.   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    Past Surgical History:  Procedure Laterality Date   ARTHROSCOPY, ANKLE WITH DEBRIDEMENT Right 06/19/2024   Procedure: ARTHROSCOPY, ANKLE WITH DEBRIDEMENT;  Surgeon: Barton Drape, MD;  Location: [facility];  Service: Orthopedics;  Laterality: Right;  right ankle arthroscopy with peroneal tendons debridement with possible repair and/or transfer   MENISCUS REPAIR Left 03/22/2022   SHOULDER ARTHROSCOPY WITH DISTAL CLAVICLE RESECTION Right 02/09/2024   Procedure: SHOULDER ARTHROSCOPY WITH DISTAL CLAVICLE RESECTION;  Surgeon: Sharl Selinda Dover, MD;  Location: [facility];  Service: Orthopedics;  Laterality: Right;   SHOULDER ARTHROSCOPY WITH SUBACROMIAL DECOMPRESSION Right 02/09/2024   Procedure: SHOULDER ARTHROSCOPY WITH SUBACROMIAL DECOMPRESSION;  Surgeon: Sharl Selinda Dover, MD;  Location: [facility];  Service: Orthopedics;  Laterality: Right;   SPHINCTEROTOMY N/A 04/07/2016   Procedure: LATERAL INTERNAL SPHINCTEROTOMY;  Surgeon: Donnice Gladis, MD;  Location: [facility];  Service: General;  Laterality: N/A;   TENDON TRANSFER Right 06/19/2024   Procedure: TRANSFER, TENDON;  Surgeon: Barton Drape, MD;  Location: [facility];  Service: Orthopedics;  Laterality: Right;   UMBILICAL HERNIA REPAIR  12/05/2010    Family History  Problem Relation Age of Onset   Kidney disease Father    CVA Father    Heart attack Father    Hypertension Father    CAD Father    Thyroid disease Mother    Multiple sclerosis Sister    CAD Paternal Uncle     Social History Social History   Tobacco Use   Smoking status: Former    Types: E-cigarettes    Passive exposure: Past   Smokeless tobacco: Never  Vaping Use   Vaping status: Never Used  Substance Use Topics   Alcohol use: Yes    Comment: rarely   Drug use: No    Allergies  Allergen Reactions   Chapstick Rash    BLISTERS   Sunscreens Rash    BLISTERS   Pork-Derived Products Other (See Comments)    Preference  Soap Itching    [place]    Current Outpatient Medications  Medication Sig Dispense Refill   amLODipine (NORVASC) 10 MG tablet Take 1 tablet by mouth daily.     cyclobenzaprine  (FLEXERIL ) 10 MG tablet Take 10 mg by mouth 3 (three) times daily as needed for muscle spasms.     gabapentin  (NEURONTIN ) 300 MG capsule Take 300 mg by mouth 3 (three) times daily.     HYDROcodone -acetaminophen  (NORCO/VICODIN) 5-325 MG tablet Take 2 tablets by mouth every 4 (four) hours as needed. 10 tablet 0   hydrocortisone  (ANUSOL -HC) 2.5 % rectal cream Place  1 Application rectally 2 (two) times daily. 30 g 0   hydrocortisone  (ANUSOL -HC) 25 MG suppository Place 1 suppository (25 mg total) rectally 2 (two) times daily. 12 suppository 0   Lidocaine , Anorectal, (HEMORRHOIDAL RELIEF) 5 % CREA Apply to painful hemorrhoid every 4 hours as needed. 113 g 0   nifedipine  0.3 % ointment Place 1 Application rectally 4 (four) times daily. 30 g 0   ondansetron  (ZOFRAN -ODT) 4 MG disintegrating tablet Take 1 tablet (4 mg total) by mouth every 8 (eight) hours as needed for nausea or vomiting. 20 tablet 0   OXcarbazepine (TRILEPTAL) 150 MG tablet Take by mouth.     oxyCODONE  (ROXICODONE ) 5 MG immediate release tablet Take 1 tablet (5 mg total) by mouth every 4 (four) hours as needed for moderate pain (pain score 4-6) or severe pain (pain score 7-10). 20 tablet 0   oxyCODONE -acetaminophen  (PERCOCET/ROXICET) 5-325 MG tablet Take 1 tablet by mouth every 6 (six) hours as needed for severe pain (pain score 7-10). 15 tablet 0   polyethylene glycol (MIRALAX ) 17 g packet Take 17 g by mouth daily. 30 each 1   traMADol (ULTRAM) 50 MG tablet Take 1 tablet every 6 hours by oral route as needed, for moderate pain.     UNABLE TO FIND Not applicable, 0 Refill(s)     No current facility-administered medications for this visit.     Review of Systems Full ROS  was asked and was negative except for the information on the HPI  Physical Exam Blood pressure (!) 155/117, pulse (!) 111, temperature 98.6 F (37 C), temperature source Oral, height 6' (1.829 m), weight 293 lb (132.9 kg), SpO2 97%. CONSTITUTIONAL: NAD. EYES: Pupils are equal, round, Sclera are non-icteric. EARS, NOSE, MOUTH AND THROAT: The oropharynx is clear. The oral mucosa is pink and moist. Hearing is intact to voice. LYMPH NODES:  Lymph nodes in the neck are normal. RESPIRATORY:  Lungs are clear. There is normal respiratory effort, with equal breath sounds bilaterally, and without pathologic use of accessory  muscles. CARDIOVASCULAR: Heart is regular without murmurs, gallops, or rubs. GI: The abdomen is  soft, nontender, and nondistended. There are no palpable masses. There is no hepatosplenomegaly. There are normal bowel sounds in all quadrants. Rectal : no hemorrhoids, there is exquisite tenderness to palpation on post midline and a fissure is seen, no blood, no rectal masses, no abscess  MUSCULOSKELETAL: Normal muscle strength and tone. No cyanosis or edema.   SKIN: Turgor is good and there are no pathologic skin lesions or ulcers. NEUROLOGIC: Motor and sensation is grossly normal. Cranial nerves are grossly intact. PSYCH:  Oriented to person, place and time. Affect is normal.  Data Reviewed I have personally reviewed the patient's imaging, laboratory findings and medical records.    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  He already had a prior sphincterotomy.  Discussed  with the patient in detail about his disease process.  It is definitely significantly complex given that he already had I do sphincterotomy 8 years ago.  At this time I will encourage him to continue nifedipine  cream and I did offer him chemical sphincterotomy with Botox .  He understands that we may have to repeat it up to 3 times and if this does not address the issue that I will may have to refer him to a colorectal specialist.  He does have significant symptomatology that I think is worth moving forward with chemical sphincterotomy and also Exparel  at the same time.  This will provide significant pain relief. I personally spent a total of 45 minutes in the care of the patient today including performing a medically appropriate exam/evaluation, counseling and educating, placing orders, referring and communicating with other health care professionals, documenting clinical information in the EHR, independently interpreting and reviewing images studies and coordinating care.    Laneta Luna, MD  FACS General Surgeon 08/12/2024, 11:59 AM

## 2024-08-12 NOTE — Progress Notes (Signed)
 Patient ID: Shane Erickson, male   DOB: 06-Oct-1976, 48 y.o.   MRN: [id number]  HPI Shane Erickson is a 48 y.o. male seen in consultation at the request of Dr. Dionisio.  For the last several weeks has had significant anorectal pain worsening when he has a bowel movement.  He reports that he has significant pain itching the pain is moderate to severe sharp in nature and worsening when he have a bowel movement.  Some occasional hematochezia.  No fevers no chills.  He has had multiple orthopedic surgeries and had recent Right ankle surgery. He did have a lateral internal sphincterotomy by Dr. Gladis in [place] 8 years ago or so.  He reports that the symptoms are very similar to what he was experiences at that time.  He also reports that the sphincterotomy had good relief. He has already tried multiple remedies including Preparation H, Anusol , nitroglycerin  cream and nifedipine  cream.  He is currently on nifedipine  cream. Did have a recent CT of the pelvis that I personally reviewed showing no evidence of any anorectal abscess,  he did have diverticulosis   HPI  Past Medical History:  Diagnosis Date   Anal fissure 03/05/2016   Hypertension    states under control with med., has been on med. x 2 yr.   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    Past Surgical History:  Procedure Laterality Date   ARTHROSCOPY, ANKLE WITH DEBRIDEMENT Right 06/19/2024   Procedure: ARTHROSCOPY, ANKLE WITH DEBRIDEMENT;  Surgeon: Barton Drape, MD;  Location: [facility];  Service: Orthopedics;  Laterality: Right;  right ankle arthroscopy with peroneal tendons debridement with possible repair and/or transfer   MENISCUS REPAIR Left 03/22/2022   SHOULDER ARTHROSCOPY WITH DISTAL CLAVICLE RESECTION Right 02/09/2024   Procedure: SHOULDER ARTHROSCOPY WITH DISTAL CLAVICLE RESECTION;  Surgeon: Sharl Selinda Dover, MD;  Location: [facility];  Service: Orthopedics;  Laterality: Right;   SHOULDER ARTHROSCOPY WITH SUBACROMIAL DECOMPRESSION Right 02/09/2024   Procedure: SHOULDER ARTHROSCOPY WITH SUBACROMIAL DECOMPRESSION;  Surgeon: Sharl Selinda Dover, MD;  Location: [facility];  Service: Orthopedics;  Laterality: Right;   SPHINCTEROTOMY N/A 04/07/2016   Procedure: LATERAL INTERNAL SPHINCTEROTOMY;  Surgeon: Donnice Gladis, MD;  Location: [facility];  Service: General;  Laterality: N/A;   TENDON TRANSFER Right 06/19/2024   Procedure: TRANSFER, TENDON;  Surgeon: Barton Drape, MD;  Location: [facility];  Service: Orthopedics;  Laterality: Right;   UMBILICAL HERNIA REPAIR  12/05/2010    Family History  Problem Relation Age of Onset   Kidney disease Father    CVA Father    Heart attack Father    Hypertension Father    CAD Father    Thyroid disease Mother    Multiple sclerosis Sister    CAD Paternal Uncle     Social History Social History   Tobacco Use   Smoking status: Former    Types: E-cigarettes    Passive exposure: Past   Smokeless tobacco: Never  Vaping Use   Vaping status: Never Used  Substance Use Topics   Alcohol use: Yes    Comment: rarely   Drug use: No    Allergies  Allergen Reactions   Chapstick Rash    BLISTERS   Sunscreens Rash    BLISTERS   Pork-Derived Products Other (See Comments)    Preference  Soap Itching    [place]    Current Outpatient Medications  Medication Sig Dispense Refill   amLODipine (NORVASC) 10 MG tablet Take 1 tablet by mouth daily.     cyclobenzaprine  (FLEXERIL ) 10 MG tablet Take 10 mg by mouth 3 (three) times daily as needed for muscle spasms.     gabapentin  (NEURONTIN ) 300 MG capsule Take 300 mg by mouth 3 (three) times daily.     HYDROcodone -acetaminophen  (NORCO/VICODIN) 5-325 MG tablet Take 2 tablets by mouth every 4 (four) hours as needed. 10 tablet 0   hydrocortisone  (ANUSOL -HC) 2.5 % rectal cream Place  1 Application rectally 2 (two) times daily. 30 g 0   hydrocortisone  (ANUSOL -HC) 25 MG suppository Place 1 suppository (25 mg total) rectally 2 (two) times daily. 12 suppository 0   Lidocaine , Anorectal, (HEMORRHOIDAL RELIEF) 5 % CREA Apply to painful hemorrhoid every 4 hours as needed. 113 g 0   nifedipine  0.3 % ointment Place 1 Application rectally 4 (four) times daily. 30 g 0   ondansetron  (ZOFRAN -ODT) 4 MG disintegrating tablet Take 1 tablet (4 mg total) by mouth every 8 (eight) hours as needed for nausea or vomiting. 20 tablet 0   OXcarbazepine (TRILEPTAL) 150 MG tablet Take by mouth.     oxyCODONE  (ROXICODONE ) 5 MG immediate release tablet Take 1 tablet (5 mg total) by mouth every 4 (four) hours as needed for moderate pain (pain score 4-6) or severe pain (pain score 7-10). 20 tablet 0   oxyCODONE -acetaminophen  (PERCOCET/ROXICET) 5-325 MG tablet Take 1 tablet by mouth every 6 (six) hours as needed for severe pain (pain score 7-10). 15 tablet 0   polyethylene glycol (MIRALAX ) 17 g packet Take 17 g by mouth daily. 30 each 1   traMADol (ULTRAM) 50 MG tablet Take 1 tablet every 6 hours by oral route as needed, for moderate pain.     UNABLE TO FIND Not applicable, 0 Refill(s)     No current facility-administered medications for this visit.     Review of Systems Full ROS  was asked and was negative except for the information on the HPI  Physical Exam Blood pressure (!) 155/117, pulse (!) 111, temperature 98.6 F (37 C), temperature source Oral, height 6' (1.829 m), weight 293 lb (132.9 kg), SpO2 97%. CONSTITUTIONAL: NAD. EYES: Pupils are equal, round, Sclera are non-icteric. EARS, NOSE, MOUTH AND THROAT: The oropharynx is clear. The oral mucosa is pink and moist. Hearing is intact to voice. LYMPH NODES:  Lymph nodes in the neck are normal. RESPIRATORY:  Lungs are clear. There is normal respiratory effort, with equal breath sounds bilaterally, and without pathologic use of accessory  muscles. CARDIOVASCULAR: Heart is regular without murmurs, gallops, or rubs. GI: The abdomen is  soft, nontender, and nondistended. There are no palpable masses. There is no hepatosplenomegaly. There are normal bowel sounds in all quadrants. Rectal : no hemorrhoids, there is exquisite tenderness to palpation on post midline and a fissure is seen, no blood, no rectal masses, no abscess  MUSCULOSKELETAL: Normal muscle strength and tone. No cyanosis or edema.   SKIN: Turgor is good and there are no pathologic skin lesions or ulcers. NEUROLOGIC: Motor and sensation is grossly normal. Cranial nerves are grossly intact. PSYCH:  Oriented to person, place and time. Affect is normal.  Data Reviewed I have personally reviewed the patient's imaging, laboratory findings and medical records.    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  He already had a prior sphincterotomy.  Discussed  with the patient in detail about his disease process.  It is definitely significantly complex given that he already had I do sphincterotomy 8 years ago.  At this time I will encourage him to continue nifedipine  cream and I did offer him chemical sphincterotomy with Botox .  He understands that we may have to repeat it up to 3 times and if this does not address the issue that I will may have to refer him to a colorectal specialist.  He does have significant symptomatology that I think is worth moving forward with chemical sphincterotomy and also Exparel  at the same time.  This will provide significant pain relief. I personally spent a total of 45 minutes in the care of the patient today including performing a medically appropriate exam/evaluation, counseling and educating, placing orders, referring and communicating with other health care professionals, documenting clinical information in the EHR, independently interpreting and reviewing images studies and coordinating care.    Laneta Luna, MD  FACS General Surgeon 08/12/2024, 11:59 AM

## 2024-08-12 NOTE — Patient Instructions (Signed)
 Your procedure is scheduled on:08-13-24 Tuesday Report to the Registration Desk on the 1st floor of the [facility].Then proceed to the 2nd floor Surgery Desk To find out your arrival time, please call [phone] between 1PM - 3PM on:08-12-24 Monday If your arrival time is 6:00 am, do not arrive before that time as the [facility] entrance doors do not open until 6:00 am.  REMEMBER: Instructions that are not followed completely may result in serious medical risk, up to and including death; or upon the discretion of your surgeon and anesthesiologist your surgery may need to be rescheduled.  Do not eat food after midnight the night before surgery.  No gum chewing or hard candies.  You may however, drink CLEAR liquids up to 2 hours before you are scheduled to arrive for your surgery. Do not drink anything within 2 hours of your scheduled arrival time.  Clear liquids include: - water  - apple juice without pulp - gatorade (not RED colors) - black coffee or tea (Do NOT add milk or creamers to the coffee or tea) Do NOT drink anything that is not on this list.  One week prior to surgery: Stop Anti-inflammatories (NSAIDS) such as Advil, Aleve , Ibuprofen, Motrin, Naproxen , Naprosyn  and Aspirin based products such as Excedrin, Goody's Powder, BC Powder. Stop ANY OVER THE COUNTER supplements until after surgery.  You may however, continue to take Tylenol  if needed for pain up until the day of surgery.  Continue taking all of your other prescription medications up until the day of surgery.  ON THE DAY OF SURGERY ONLY TAKE THESE MEDICATIONS WITH SIPS OF WATER: -gabapentin  (NEURONTIN )  -You may take oxyCODONE -acetaminophen  (PERCOCET/ROXICET) if needed for pain  No Alcohol for 24 hours before or after surgery.  No Smoking including e-cigarettes for 24 hours before surgery.  No chewable tobacco products for at least 6 hours before surgery.  No nicotine patches on the day of surgery.  Do not  use any recreational drugs for at least a week (preferably 2 weeks) before your surgery.  Please be advised that the combination of cocaine and anesthesia may have negative outcomes, up to and including death. If you test positive for cocaine, your surgery will be cancelled.  On the morning of surgery brush your teeth with toothpaste and water, you may rinse your mouth with mouthwash if you wish. Do not swallow any toothpaste or mouthwash.  Do not wear jewelry, make-up, hairpins, clips or nail polish.  For welded (permanent) jewelry: bracelets, anklets, waist bands, etc.  Please have this removed prior to surgery.  If it is not removed, there is a chance that hospital personnel will need to cut it off on the day of surgery.  Do not wear lotions, powders, or perfumes.   Do not shave body hair from the neck down 48 hours before surgery.  Contact lenses, hearing aids and dentures may not be worn into surgery.  Do not bring valuables to the hospital. [facility] is not responsible for any missing/lost belongings or valuables.  Bring your C-PAP to the hospital   Notify your doctor if there is any change in your medical condition (cold, fever, infection).  Wear comfortable clothing (specific to your surgery type) to the hospital.  After surgery, you can help prevent lung complications by doing breathing exercises.  Take deep breaths and cough every 1-2 hours. Your doctor may order a device called an Incentive Spirometer to help you take deep breaths. When coughing or sneezing, hold a  pillow firmly against your incision with both hands. This is called "splinting." Doing this helps protect your incision. It also decreases belly discomfort.  If you are being admitted to the hospital overnight, leave your suitcase in the car. After surgery it may be brought to your room.  In case of increased patient census, it may be necessary for you, the patient, to continue your postoperative care in the  Same Day Surgery department.  If you are being discharged the day of surgery, you will not be allowed to drive home. You will need a responsible individual to drive you home and stay with you for 24 hours after surgery.   If you are taking public transportation, you will need to have a responsible individual with you.  Please call the Pre-admissions Testing Dept. at [phone] if you have any questions about these instructions.  Surgery Visitation Policy:  Patients having surgery or a procedure may have two visitors.  Children under the age of 87 must have an adult with them who is not the patient.   Merchandiser, retail to address health-related social needs:  [email or web address]

## 2024-08-12 NOTE — Patient Instructions (Signed)
 You have requested to have an exam under anesthesia with Botox  injection. This will be done at [facility] with Dr. Jordis .  If you are on any injectable weight loss medication, you will need to stop taking your GLP-1 injectable (weight loss) medications 8 days before your surgery to avoid any complications with anesthesia.   You will most likely be out of work 3-4 days for this surgery.  If you have FMLA or disability paperwork that needs filled out you may drop this off at our office or this can be faxed to [phone].  Please see the (blue)pre-care form that you have been given today. Our surgery scheduler will call you to verify surgery date and to go over information.   If you have any questions, please call our office.   Botox  is injected close to or into the internal sphincter muscle. The injection works by causing the muscle to become paralysed and thereby reducing the amount of spasm or contraction that it undergoes in conjunction with a fissure. This enforces relaxation and allows more blood to the area to heal the fissure. The Botox  lasts in your system for between 8-12 weeks. This will usually allow the fissure enough time to heal. It is usually well tolerated but does require a general anaesthetic to administer. This will also allow the surgeon to examine you in some detail, which may be too uncomfortable in the clinic. As it is well tolerated, people who fail to heal on the first injection may often be offered repeated injections in an attempt to heal the fissure. If the fissure still continues to be a problem despite repeated Botox  injections, then further procedures may be considered depending on your case. Due to the weakening of the muscle, some people do experience a degree of urgency or incontinence after the injection, although this is temporary as the Botox  does wear off.   Anal Fissure, Adult  An anal fissure is a small tear or crack in the tissue around the  opening of the butt (anus). Bleeding from the tear or crack usually stops on its own within a few minutes. The bleeding may happen every time you poop (have a bowel movement) until the tear or crack heals. What are the causes? This condition is usually caused by passing a large or hard poop (stool). Other causes include: Trouble pooping (constipation). Passing watery poop (diarrhea). Inflammatory bowel disease (Crohn's disease or ulcerative colitis). Childbirth. Infections. Anal sex. What are the signs or symptoms? Symptoms of this condition include: Bleeding from the butt. Small amounts of blood on your poop. The blood coats the outside of the poop. It is not mixed with the poop. Small amounts of blood on the toilet paper or in the toilet after you poop. Pain when passing poop. Itching or irritation around the opening of the butt. How is this diagnosed? This condition may be diagnosed based on a physical exam. Your doctor may: Check your butt. A tear can often be seen by checking the area with care. Check your butt using a short tube (anoscope). The light in the tube will show any problems in your butt. How is this treated? Treatment for this condition may include: Treating problems that make it hard for you to pass poop. You may be told to: Eat more fiber. Drink more fluid. Take fiber supplements. Take medicines that make poop soft. Taking warm water baths (sitz baths). This may help to heal the tear. Using creams and ointments. If your  condition gets worse, other treatments may be needed such as: A shot near the tear or crack (botulinum injection). Surgery to repair the tear or crack. Follow these instructions at home: Eating and drinking  Avoid bananas and dairy products. These foods can make it hard to poop. Drink enough fluid to keep your pee (urine) pale yellow. Eat foods that have a lot of fiber in them, such as: Beans. Whole grains. Fresh fruits. Fresh  vegetables. General instructions  Take over-the-counter and prescription medicines only as told by your doctor. Use creams or ointments only as told by your doctor. Keep the butt area as clean and dry as you can. Take a warm water bath as told by your doctor. Do not use soap. Keep all follow-up visits as told by your doctor. This is important. Contact a doctor if: You have more bleeding. You have a fever. You have watery poop that is mixed with blood. You have pain. Your problem gets worse, not better. Summary An anal fissure is a small tear or crack in the skin around the opening of the butt (anus). This condition is usually caused by passing a large or hard poop (stool). Treatment includes treating the problems that make it hard for you to pass poop. Follow your doctor's instructions about caring for your condition at home. Keep all follow-up visits as told by your doctor. This is important. This information is not intended to replace advice given to you by your health care provider. Make sure you discuss any questions you have with your health care provider. Document Revised: 06/17/2021 Document Reviewed: 06/17/2021 Elsevier Patient Education  2023 [employer].

## 2024-08-13 ENCOUNTER — Ambulatory Visit
Admission: RE | Admit: 2024-08-13 | Discharge: 2024-08-13 | Disposition: A | Discharge status: Home / Self Care | Attending: Surgery | Admitting: Surgery

## 2024-08-13 ENCOUNTER — Ambulatory Visit: Payer: Self-pay | Admitting: Urgent Care

## 2024-08-13 ENCOUNTER — Ambulatory Visit

## 2024-08-13 ENCOUNTER — Other Ambulatory Visit: Payer: Self-pay

## 2024-08-13 ENCOUNTER — Encounter
Admission: RE | Disposition: A | Discharge status: Home / Self Care | Payer: Self-pay | Source: Home / Self Care | Attending: Surgery

## 2024-08-13 ENCOUNTER — Encounter: Payer: Self-pay | Admitting: Surgery

## 2024-08-13 DIAGNOSIS — Z79899 Other long term (current) drug therapy: Secondary | ICD-10-CM | POA: Insufficient documentation

## 2024-08-13 DIAGNOSIS — G4733 Obstructive sleep apnea (adult) (pediatric): Secondary | ICD-10-CM | POA: Insufficient documentation

## 2024-08-13 DIAGNOSIS — I10 Essential (primary) hypertension: Secondary | ICD-10-CM | POA: Diagnosis not present

## 2024-08-13 DIAGNOSIS — K602 Anal fissure, unspecified: Secondary | ICD-10-CM | POA: Diagnosis present

## 2024-08-13 DIAGNOSIS — Z87891 Personal history of nicotine dependence: Secondary | ICD-10-CM | POA: Diagnosis not present

## 2024-08-13 HISTORY — PX: INJECTION, CHEMODENERVATION AGENT: SHX7597

## 2024-08-13 HISTORY — PX: RECTAL EXAM UNDER ANESTHESIA: SHX6399

## 2024-08-13 LAB — GLUCOSE, CAPILLARY: Glucose-Capillary: 78 mg/dL (ref 70–99)

## 2024-08-13 SURGERY — EXAM UNDER ANESTHESIA, RECTUM
Anesthesia: Monitor Anesthesia Care

## 2024-08-13 MED ORDER — CELECOXIB 200 MG PO CAPS
ORAL_CAPSULE | ORAL | Status: AC
  Filled 2024-08-13: qty 1

## 2024-08-13 MED ORDER — FENTANYL CITRATE (PF) 100 MCG/2ML IJ SOLN
INTRAMUSCULAR | Status: AC
  Filled 2024-08-13: qty 2

## 2024-08-13 MED ORDER — FENTANYL CITRATE (PF) 100 MCG/2ML IJ SOLN
INTRAMUSCULAR | Status: DC | PRN
  Administered 2024-08-13 (×2): 50 ug via INTRAVENOUS

## 2024-08-13 MED ORDER — DROPERIDOL 2.5 MG/ML IJ SOLN: 0.6250 mg | Freq: Once | INTRAMUSCULAR | Status: DC | PRN

## 2024-08-13 MED ORDER — ACETAMINOPHEN 10 MG/ML IV SOLN: 1000.0000 mg | Freq: Once | INTRAVENOUS | Status: DC | PRN

## 2024-08-13 MED ORDER — PROPOFOL 500 MG/50ML IV EMUL
INTRAVENOUS | Status: DC | PRN
  Administered 2024-08-13: 50 ug via INTRAVENOUS
  Administered 2024-08-13: 30 ug via INTRAVENOUS
  Administered 2024-08-13: 20 ug via INTRAVENOUS
  Administered 2024-08-13: 50 ug via INTRAVENOUS
  Administered 2024-08-13: 20 ug via INTRAVENOUS

## 2024-08-13 MED ORDER — SODIUM CHLORIDE 0.9 % IV SOLN
2.0000 g | INTRAVENOUS | Status: AC
  Administered 2024-08-13: 2 g via INTRAVENOUS

## 2024-08-13 MED ORDER — MIDAZOLAM HCL 2 MG/2ML IJ SOLN
INTRAMUSCULAR | Status: AC
  Filled 2024-08-13: qty 2

## 2024-08-13 MED ORDER — ACETAMINOPHEN 500 MG PO TABS
ORAL_TABLET | ORAL | Status: AC
  Filled 2024-08-13: qty 2

## 2024-08-13 MED ORDER — CHLORHEXIDINE GLUCONATE CLOTH 2 % EX PADS: 6.0000 | MEDICATED_PAD | Freq: Once | CUTANEOUS | Status: DC

## 2024-08-13 MED ORDER — FENTANYL CITRATE (PF) 100 MCG/2ML IJ SOLN: 25.0000 ug | INTRAMUSCULAR | Status: DC | PRN

## 2024-08-13 MED ORDER — OXYCODONE HCL 5 MG PO TABS: 5.0000 mg | ORAL_TABLET | Freq: Four times a day (QID) | ORAL | 0 refills | Status: DC | PRN

## 2024-08-13 MED ORDER — ORAL CARE MOUTH RINSE: 15.0000 mL | Freq: Once | OROMUCOSAL | Status: AC

## 2024-08-13 MED ORDER — ACETAMINOPHEN 500 MG PO TABS
1000.0000 mg | ORAL_TABLET | ORAL | Status: AC
  Administered 2024-08-13: 1000 mg via ORAL

## 2024-08-13 MED ORDER — OXYCODONE HCL 5 MG PO TABS: 5.0000 mg | ORAL_TABLET | Freq: Once | ORAL | Status: DC | PRN

## 2024-08-13 MED ORDER — LIDOCAINE HCL (CARDIAC) PF 100 MG/5ML IV SOSY
PREFILLED_SYRINGE | INTRAVENOUS | Status: DC | PRN
Start: 2024-08-13 — End: 2024-08-13
  Administered 2024-08-13: 100 mg via INTRAVENOUS

## 2024-08-13 MED ORDER — BUPIVACAINE LIPOSOME 1.3 % IJ SUSP
INTRAMUSCULAR | Status: DC | PRN
  Administered 2024-08-13: 20 mL

## 2024-08-13 MED ORDER — CHLORHEXIDINE GLUCONATE 0.12 % MT SOLN
OROMUCOSAL | Status: AC
  Filled 2024-08-13: qty 15

## 2024-08-13 MED ORDER — MIDAZOLAM HCL 2 MG/2ML IJ SOLN
INTRAMUSCULAR | Status: DC | PRN
  Administered 2024-08-13: 2 mg via INTRAVENOUS

## 2024-08-13 MED ORDER — SODIUM CHLORIDE (PF) 0.9 % IJ SOLN
INTRAMUSCULAR | Status: AC
  Filled 2024-08-13: qty 10

## 2024-08-13 MED ORDER — SODIUM CHLORIDE 0.9 % IV SOLN
INTRAVENOUS | Status: AC
  Filled 2024-08-13: qty 2

## 2024-08-13 MED ORDER — LIDOCAINE HCL (PF) 2 % IJ SOLN
INTRAMUSCULAR | Status: AC
  Filled 2024-08-13: qty 5

## 2024-08-13 MED ORDER — PROPOFOL 10 MG/ML IV BOLUS
INTRAVENOUS | Status: AC
  Filled 2024-08-13: qty 20

## 2024-08-13 MED ORDER — CHLORHEXIDINE GLUCONATE 0.12 % MT SOLN
15.0000 mL | Freq: Once | OROMUCOSAL | Status: AC
  Administered 2024-08-13: 15 mL via OROMUCOSAL

## 2024-08-13 MED ORDER — OXYCODONE HCL 5 MG/5ML PO SOLN: 5.0000 mg | Freq: Once | ORAL | Status: DC | PRN

## 2024-08-13 MED ORDER — GABAPENTIN 300 MG PO CAPS: 300.0000 mg | ORAL_CAPSULE | ORAL | Status: DC

## 2024-08-13 MED ORDER — CEFAZOLIN SODIUM-DEXTROSE 2-4 GM/100ML-% IV SOLN
INTRAVENOUS | Status: AC
  Filled 2024-08-13: qty 100

## 2024-08-13 MED ORDER — LACTATED RINGERS IV SOLN: INTRAVENOUS | Status: DC

## 2024-08-13 MED ORDER — BUPIVACAINE LIPOSOME 1.3 % IJ SUSP
INTRAMUSCULAR | Status: AC
  Filled 2024-08-13: qty 20

## 2024-08-13 MED ORDER — CELECOXIB 200 MG PO CAPS
200.0000 mg | ORAL_CAPSULE | ORAL | Status: AC
  Administered 2024-08-13: 200 mg via ORAL

## 2024-08-13 MED ORDER — ONABOTULINUMTOXINA 100 UNITS IJ SOLR
100.0000 [IU] | Freq: Once | INTRAMUSCULAR | Status: AC
  Administered 2024-08-13: 100 [IU] via INTRAMUSCULAR
  Filled 2024-08-13: qty 100

## 2024-08-13 MED ORDER — PROPOFOL 1000 MG/100ML IV EMUL
INTRAVENOUS | Status: AC
  Filled 2024-08-13: qty 100

## 2024-08-13 SURGICAL SUPPLY — 27 items
BLADE CLIPPER SURG (BLADE) ×1 IMPLANT
BLADE SURG 15 STRL LF DISP TIS (BLADE) ×1 IMPLANT
BRIEF MESH DISP 2XL (UNDERPADS AND DIAPERS) ×2 IMPLANT
DRAPE LAPAROTOMY 77X122 PED (DRAPES) ×1 IMPLANT
DRAPE LEGGINS SURG 28X43 STRL (DRAPES) IMPLANT
DRAPE SHEET LG 3/4 BI-LAMINATE (DRAPES) ×1 IMPLANT
DRAPE UNDER BUTTOCK W/FLU (DRAPES) IMPLANT
DRSG GAUZE FLUFF 36X18 (GAUZE/BANDAGES/DRESSINGS) ×1 IMPLANT
ELECT CAUTERY BLADE 6.4 (BLADE) ×1 IMPLANT
ELECTRODE REM PT RTRN 9FT ADLT (ELECTROSURGICAL) ×1 IMPLANT
GAUZE 4X4 16PLY ~~LOC~~+RFID DBL (SPONGE) ×1 IMPLANT
GLOVE BIO SURGEON STRL SZ7 (GLOVE) ×1 IMPLANT
GOWN STRL REUS W/ TWL LRG LVL3 (GOWN DISPOSABLE) ×2 IMPLANT
MANIFOLD NEPTUNE II (INSTRUMENTS) ×1 IMPLANT
NDL HYPO 22X1.5 SAFETY MO (MISCELLANEOUS) ×1 IMPLANT
NEEDLE HYPO 22X1.5 SAFETY MO (MISCELLANEOUS) ×1 IMPLANT
NS IRRIG 500ML POUR BTL (IV SOLUTION) ×1 IMPLANT
PACK BASIN MINOR ARMC (MISCELLANEOUS) ×1 IMPLANT
PAD ABD DERMACEA PRESS 5X9 (GAUZE/BANDAGES/DRESSINGS) ×1 IMPLANT
PAD PREP OB/GYN DISP 24X41 (PERSONAL CARE ITEMS) ×1 IMPLANT
SHEARS HARMONIC 9CM CVD (BLADE) ×1 IMPLANT
SOLUTION PREP PVP 2OZ (MISCELLANEOUS) ×1 IMPLANT
SPONGE T-LAP 18X18 ~~LOC~~+RFID (SPONGE) ×1 IMPLANT
SURGILUBE 2OZ TUBE FLIPTOP (MISCELLANEOUS) ×1 IMPLANT
SYR 20ML LL LF (SYRINGE) ×1 IMPLANT
TRAP FLUID SMOKE EVACUATOR (MISCELLANEOUS) ×1 IMPLANT
WATER STERILE IRR 500ML POUR (IV SOLUTION) ×1 IMPLANT

## 2024-08-13 NOTE — Discharge Instructions (Signed)
 Anal Fissure, Adult  An anal fissure is a small tear or crack in the tissue near the opening of the butt (anus). In most cases, bleeding from the tear or crack stops on its own within a few minutes. You may have bleeding each time you poop until the tear or crack heals. What are the causes? Passing a large or hard poop (stool). Having trouble pooping (constipation). Having watery poops (diarrhea). An inflammatory bowel disease, like Crohn's disease or ulcerative colitis. Childbirth. Infections. Anal sex. What are the signs or symptoms? Bleeding from the butt. Small amounts of blood on your poop. The blood coats the outside of the poop. It is not mixed with the poop. Small amounts of blood on the toilet paper or in the toilet after you poop. Pain when you poop. Itching or irritation around your butt. How is this treated? Treatment may include: Making changes to what you eat and drink. This can help if you have trouble pooping. Taking fiber supplements. These can help make your poop soft. Taking warm water baths (sitz baths). These can help heal the tear. Using creams and ointments. If other treatments do not work, you may need: A shot near the tear or crack (botulinum injection). Surgery to fix the tear or crack. Follow these instructions at home: Medicines Take over-the-counter and prescription medicines only as told by your doctor. This includes creams and ointments that have medicine in them. Use medicines to make your poop soft as told by your doctor. Treating constipation You may need to take these actions to prevent or treat trouble pooping: Drink enough fluid to keep your pee (urine) pale yellow. Eat foods that are high in fiber. These include beans, whole grains, and fresh fruits and vegetables. Stay away from unripe bananas. Ripe bananas are a good choice. Limit foods that are high in fat and sugar. These include fried or sweet foods. Avoid dairy products. This includes  milk.  General instructions  Keep the butt area clean and dry. Take a warm water bath as told by your doctor. Do not use soap. Contact a doctor if: You have more bleeding. You have a fever. You have watery poop that is mixed with blood. Your pain does not go away. Your problems get worse. This information is not intended to replace advice given to you by your health care provider. Make sure you discuss any questions you have with your health care provider. Document Revised: 12/08/2022 Document Reviewed: 12/08/2022 Elsevier Patient Education  2024 [employer].

## 2024-08-13 NOTE — Anesthesia Postprocedure Evaluation (Signed)
 Anesthesia Post Note  Patient: Shane Erickson  Procedure(s) Performed: EXAM UNDER ANESTHESIA, RECTUM INJECTION, CHEMODENERVATION AGENT  Patient location during evaluation: PACU Anesthesia Type: MAC Level of consciousness: awake and alert Pain management: pain level controlled Vital Signs Assessment: post-procedure vital signs reviewed and stable Respiratory status: spontaneous breathing, nonlabored ventilation, respiratory function stable and patient connected to nasal cannula oxygen Cardiovascular status: blood pressure returned to baseline and stable Postop Assessment: no apparent nausea or vomiting Anesthetic complications: no   There were no known notable events for this encounter.   Last Vitals:  Vitals:   08/13/24 1203 08/13/24 1214  BP: 117/79 (!) 127/96  Pulse:  79  Resp: (!) 22 20  Temp: 36.6 C (!) 36.2 C  SpO2: 100% 100%    Last Pain:  Vitals:   08/13/24 1214  TempSrc:   PainSc: 0-No pain                 Lynwood KANDICE Clause

## 2024-08-13 NOTE — Transfer of Care (Signed)
 Immediate Anesthesia Transfer of Care Note  Patient: Shane Erickson  Procedure(s) Performed: EXAM UNDER ANESTHESIA, RECTUM INJECTION, CHEMODENERVATION AGENT  Patient Location: PACU  Anesthesia Type:MAC  Level of Consciousness: awake and oriented  Airway & Oxygen Therapy: Patient Spontanous Breathing  Post-op Assessment: Report given to RN and Post -op Vital signs reviewed and stable  Post vital signs: Reviewed and stable  Last Vitals:  Vitals Value Taken Time  BP 141/97 08/13/24 11:47  Temp    Pulse 88 08/13/24 11:47  Resp 23 08/13/24 11:47  SpO2 99 % 08/13/24 11:47  Vitals shown include unfiled device data.  Last Pain:  Vitals:   08/13/24 1147  TempSrc:   PainSc: 0-No pain         Complications: There were no known notable events for this encounter.

## 2024-08-13 NOTE — Anesthesia Preprocedure Evaluation (Signed)
 Anesthesia Evaluation  Patient identified by MRN, date of birth, ID band Patient awake    Reviewed: Allergy & Precautions, H&P , NPO status , Patient's Chart, lab work & pertinent test results, reviewed documented beta blocker date and time   Airway Mallampati: II  TM Distance: >3 FB Neck ROM: full    Dental  (+) Teeth Intact   Pulmonary sleep apnea , former smoker   Pulmonary exam normal        Cardiovascular Exercise Tolerance: Good hypertension, On Medications negative cardio ROS Normal cardiovascular exam Rate:Normal     Neuro/Psych negative neurological ROS  negative psych ROS   GI/Hepatic negative GI ROS,,,(+) Hepatitis -  Endo/Other  negative endocrine ROS    Renal/GU negative Renal ROS  negative genitourinary   Musculoskeletal   Abdominal   Peds  Hematology negative hematology ROS (+)   Anesthesia Other Findings   Reproductive/Obstetrics negative OB ROS                              Anesthesia Physical Anesthesia Plan  ASA: 3  Anesthesia Plan: General LMA   Post-op Pain Management:    Induction:   PONV Risk Score and Plan: 3  Airway Management Planned:   Additional Equipment:   Intra-op Plan:   Post-operative Plan:   Informed Consent: I have reviewed the patients History and Physical, chart, labs and discussed the procedure including the risks, benefits and alternatives for the proposed anesthesia with the patient or authorized representative who has indicated his/her understanding and acceptance.       Plan Discussed with: CRNA  Anesthesia Plan Comments:         Anesthesia Quick Evaluation

## 2024-08-13 NOTE — Interval H&P Note (Signed)
 History and Physical Interval Note:  08/13/2024 10:41 AM  Shane Erickson  has presented today for surgery, with the diagnosis of Anal fissure.  The various methods of treatment have been discussed with the patient and family. After consideration of risks, benefits and other options for treatment, the patient has consented to  Procedure(s): EXAM UNDER ANESTHESIA, RECTUM (N/A) INJECTION, CHEMODENERVATION AGENT (N/A) as a surgical intervention.  The patient's history has been reviewed, patient examined, no change in status, stable for surgery.  I have reviewed the patient's chart and labs.  Questions were answered to the patient's satisfaction.     [name]

## 2024-08-13 NOTE — Op Note (Signed)
  PRE-OPERATIVE DIAGNOSIS:  Recurrent Anal fissure  POST-OPERATIVE DIAGNOSIS:  Anal fissure  PROCEDURE:   1. Anorectal Exam under Anesthesia 2. Chemical Lateral internal Sphincterotomy using 100 IU Botox . 50 units applied to the left and 50 units applied to the right of the internal sphincter  SURGEON:  Surgeon(s) and Role:    * Derion Kreiter F, MD - Primary  FINDINGS: posterior midline fissure  EBL: minimal  ANESTHESIA: General    DICTATION:  Patient was explained about the  Procedure in detail. Risks, benefits and possible complications ( including but not limited to recurrence, transient incontinence, pain, bleeding)  and a consent was obtained. The patient taken to the operating room and placed in the lithotomy position.   Exam under anesthesia using the anal speculum revealed a good size posterior midline fissure. .  No other intraluminal lesions were observed. Digital palpation was performed identifying the Internal Sphincter muscle to the left and on the lateral aspect and injected 50 international units of Botox  in the standard fashion. Identical procedure was done to the Right of the Internal Sphincter muscle by injection the other 5 I units of botox . Liposomal Marcaine   was injected around the perianal site. Needle and laparotomy counts were correct and there were no immediate complications  Laneta JULIANNA Luna, MD, FACS

## 2024-08-14 ENCOUNTER — Encounter: Payer: Self-pay | Admitting: Surgery

## 2024-08-16 ENCOUNTER — Other Ambulatory Visit: Payer: Self-pay | Admitting: General Surgery

## 2024-08-16 ENCOUNTER — Encounter: Payer: Self-pay | Admitting: General Surgery

## 2024-08-16 MED ORDER — OXYCODONE HCL 5 MG PO TABS: 5.0000 mg | ORAL_TABLET | Freq: Three times a day (TID) | ORAL | 0 refills | Status: DC | PRN

## 2024-08-21 ENCOUNTER — Ambulatory Visit (HOSPITAL_COMMUNITY)
Admission: RE | Admit: 2024-08-21 | Discharge: 2024-08-21 | Disposition: A | Discharge status: Home / Self Care | Source: Ambulatory Visit | Attending: Vascular Surgery | Admitting: Vascular Surgery

## 2024-08-21 ENCOUNTER — Other Ambulatory Visit (HOSPITAL_COMMUNITY): Payer: Self-pay | Admitting: Medical

## 2024-08-21 DIAGNOSIS — R600 Localized edema: Secondary | ICD-10-CM | POA: Diagnosis not present

## 2024-08-28 ENCOUNTER — Ambulatory Visit (INDEPENDENT_AMBULATORY_CARE_PROVIDER_SITE_OTHER): Admitting: Surgery

## 2024-08-28 ENCOUNTER — Encounter: Payer: Self-pay | Admitting: Surgery

## 2024-08-28 VITALS — BP 162/109 | HR 99 | Temp 98.9°F | Ht 72.0 in | Wt 301.6 lb

## 2024-08-28 DIAGNOSIS — K602 Anal fissure, unspecified: Secondary | ICD-10-CM

## 2024-08-28 DIAGNOSIS — Z09 Encounter for follow-up examination after completed treatment for conditions other than malignant neoplasm: Secondary | ICD-10-CM | POA: Diagnosis not present

## 2024-08-28 NOTE — Patient Instructions (Signed)
 Anal Fissure, Adult  An anal fissure is a small tear or crack in the tissue near the opening of the butt (anus). In most cases, bleeding from the tear or crack stops on its own within a few minutes. You may have bleeding each time you poop until the tear or crack heals. What are the causes? Passing a large or hard poop (stool). Having trouble pooping (constipation). Having watery poops (diarrhea). An inflammatory bowel disease, like Crohn's disease or ulcerative colitis. Childbirth. Infections. Anal sex. What are the signs or symptoms? Bleeding from the butt. Small amounts of blood on your poop. The blood coats the outside of the poop. It is not mixed with the poop. Small amounts of blood on the toilet paper or in the toilet after you poop. Pain when you poop. Itching or irritation around your butt. How is this treated? Treatment may include: Making changes to what you eat and drink. This can help if you have trouble pooping. Taking fiber supplements. These can help make your poop soft. Taking warm water baths (sitz baths). These can help heal the tear. Using creams and ointments. If other treatments do not work, you may need: A shot near the tear or crack (botulinum injection). Surgery to fix the tear or crack. Follow these instructions at home: Medicines Take over-the-counter and prescription medicines only as told by your doctor. This includes creams and ointments that have medicine in them. Use medicines to make your poop soft as told by your doctor. Treating constipation You may need to take these actions to prevent or treat trouble pooping: Drink enough fluid to keep your pee (urine) pale yellow. Eat foods that are high in fiber. These include beans, whole grains, and fresh fruits and vegetables. Stay away from unripe bananas. Ripe bananas are a good choice. Limit foods that are high in fat and sugar. These include fried or sweet foods. Avoid dairy products. This includes  milk.  General instructions  Keep the butt area clean and dry. Take a warm water bath as told by your doctor. Do not use soap. Contact a doctor if: You have more bleeding. You have a fever. You have watery poop that is mixed with blood. Your pain does not go away. Your problems get worse. This information is not intended to replace advice given to you by your health care provider. Make sure you discuss any questions you have with your health care provider. Document Revised: 12/08/2022 Document Reviewed: 12/08/2022 Elsevier Patient Education  2024 [employer].

## 2024-08-29 NOTE — Progress Notes (Signed)
 Outpatient Surgical Follow Up  08/29/2024  Shane Erickson is an 48 y.o. male.   Chief Complaint  Patient presents with   Routine Post Op    EUA 08/13/24    HPI: s/p chemical sphinterotomy, rectal pain is better but still there, he does have chronic pain issues as well. HE is appreciative   Past Medical History:  Diagnosis Date   Anal fissure 03/05/2016   Hypertension    states under control with med., has been on med. x 2 yr.   Metabolic dysfunction-associated steatohepatitis (MASH)    Obesity (BMI 30-39.9)    OSA on CPAP    Pre-diabetes     Past Surgical History:  Procedure Laterality Date   ARTHROSCOPY, ANKLE WITH DEBRIDEMENT Right 06/19/2024   Procedure: ARTHROSCOPY, ANKLE WITH DEBRIDEMENT;  Surgeon: Barton Drape, MD;  Location: [facility];  Service: Orthopedics;  Laterality: Right;  right ankle arthroscopy with peroneal tendons debridement with possible repair and/or transfer   INJECTION, CHEMODENERVATION AGENT N/A 08/13/2024   Procedure: INJECTION, CHEMODENERVATION AGENT;  Surgeon: Jordis Laneta FALCON, MD;  Location: [facility];  Service: General;  Laterality: N/A;   MENISCUS REPAIR Left 03/22/2022   RECTAL EXAM UNDER ANESTHESIA N/A 08/13/2024   Procedure: EXAM UNDER ANESTHESIA, RECTUM;  Surgeon: Jordis Laneta FALCON, MD;  Location: [facility];  Service: General;  Laterality: N/A;   SHOULDER ARTHROSCOPY WITH DISTAL CLAVICLE RESECTION Right 02/09/2024   Procedure: SHOULDER ARTHROSCOPY WITH DISTAL CLAVICLE RESECTION;  Surgeon: Sharl Selinda Dover, MD;  Location: [facility];  Service: Orthopedics;  Laterality: Right;   SHOULDER ARTHROSCOPY WITH SUBACROMIAL DECOMPRESSION Right 02/09/2024   Procedure: SHOULDER ARTHROSCOPY WITH SUBACROMIAL DECOMPRESSION;  Surgeon: Sharl Selinda Dover, MD;  Location: [facility];  Service: Orthopedics;  Laterality: Right;   SPHINCTEROTOMY N/A 04/07/2016   Procedure: LATERAL INTERNAL SPHINCTEROTOMY;  Surgeon: Donnice Lunger, MD;  Location: [facility];  Service: General;  Laterality: N/A;   TENDON TRANSFER Right 06/19/2024   Procedure: TRANSFER, TENDON;  Surgeon: Barton Drape, MD;  Location: [facility];  Service: Orthopedics;  Laterality: Right;   UMBILICAL HERNIA REPAIR  12/05/2010    Family History  Problem Relation Age of Onset   Kidney disease Father    CVA Father    Heart attack Father    Hypertension Father    CAD Father    Thyroid disease Mother    Multiple sclerosis Sister    CAD Paternal Uncle     Social History:  reports that he has quit smoking. His smoking use included e-cigarettes. He has been exposed to tobacco smoke. He has never used smokeless tobacco. He reports current alcohol use. He reports that he does not use drugs.  Allergies:  Allergies  Allergen Reactions   Chapstick Rash    BLISTERS   Sunscreens Rash    BLISTERS   Pork-Derived Products Other (See Comments)    Preference    Soap Itching    [place]    Medications reviewed.    ROS Full ROS performed and is otherwise negative other than what is stated in HPI   BP (!) 162/109   Pulse 99   Temp 98.9 F (37.2 C) (Oral)   Ht 6' (1.829 m)   Wt (!) 301 lb 9.6 oz (136.8 kg)   SpO2 95%   BMI 40.90 kg/m   Physical Exam  NAD, chaperone present Abd: soft nt Rectum: no evidence of abscess or perineal sepsis. Still tender on palpation,  no bleeding    Assessment/Plan: Doing better but persistent anorectal pain, d/w pt that it may take a while and his pain is multifactorial.  Complex case He wishes to seek colorectal specialist and we will make arrangements to see Dr Debby Encourage to f/u w pain team and continue nifedipine , sitz baths and medical rx    Laneta Luna, MD [facility] General Surgeon

## 2024-10-09 NOTE — Progress Notes (Addendum)
  Date of COVID positive in last 90 days:  PCP - Nell Piety, FNP Cardiologist -   Chest x-ray - N/A EKG - 02-08-24 Epic Stress Test - N/A ECHO - N/A Cardiac Cath -  Pacemaker/ICD device last checked:N/A Spinal Cord Stimulator:N/A  Bowel Prep - N/A  Sleep Study - N/A CPAP -   Fasting Blood Sugar - N/A Checks Blood Sugar _____ times a day  Ozempic Last dose of GLP1 agonist-  N/A GLP1 instructions:  Do not take after 10-15-24   Last dose of SGLT-2 inhibitors-  N/A SGLT-2 instructions:  Do not take after     Blood Thinner Instructions: N/A Last dose:   Time: Aspirin Instructions:N/A Last Dose:  Activity level:  Can go up a flight of stairs and perform activities of daily living without stopping and without symptoms of chest pain or shortness of breath.  Able to exercise without symptoms  Unable to go up a flight of stairs without symptoms of     Anesthesia review: N/A  Patient denies shortness of breath, fever, cough and chest pain at PAT appointment  Patient verbalized understanding of instructions that were given to them at the PAT appointment. Patient was also instructed that they will need to review over the PAT instructions again at home before surgery.

## 2024-10-09 NOTE — Patient Instructions (Signed)
 SURGICAL WAITING ROOM VISITATION Patients having surgery or a procedure may have no more than 2 support people in the waiting area - these visitors may rotate.    Children under the age of 35 must have an adult with them who is not the patient.  If the patient needs to stay at the hospital during part of their recovery, the visitor guidelines for inpatient rooms apply. Pre-op nurse will coordinate an appropriate time for 1 support person to accompany patient in pre-op.  This support person may not rotate.    Please refer to the [facility] website for the visitor guidelines for Inpatients (after your surgery is over and you are in a regular room).       Your procedure is scheduled on: 10-23-24   Report to [facility] Main Entrance    Report to admitting at 8:00 AM   Call this number if you have problems the morning of surgery [phone]   Do not eat food :After Midnight.   After Midnight you may have the following liquids until 7:30 AM DAY OF SURGERY  Water Non-Citrus Juices (without pulp, NO RED-Apple, White grape, White cranberry) Black Coffee (NO MILK/CREAM OR CREAMERS, sugar ok)  Clear Tea (NO MILK/CREAM OR CREAMERS, sugar ok) regular and decaf                             Plain Jell-O (NO RED)                                           Fruit ices (not with fruit pulp, NO RED)                                     Popsicles (NO RED)                                                               Sports drinks like Gatorade (NO RED)                   The day of surgery:  Drink ONE (1) Pre-Surgery Clear Ensure or G2 by 7:30 AM the morning of surgery. Drink in one sitting. Do not sip.  This drink was given to you during your hospital  pre-op appointment visit. Nothing else to drink after completing the Pre-Surgery Clear Ensure or G2.          If you have questions, please contact your surgeon's office.   FOLLOW  ANY ADDITIONAL PRE OP INSTRUCTIONS YOU RECEIVED FROM  YOUR SURGEON'S OFFICE!!!     Oral Hygiene is also important to reduce your risk of infection.                                    Remember - BRUSH YOUR TEETH THE MORNING OF SURGERY WITH YOUR REGULAR TOOTHPASTE   Do NOT smoke after Midnight   Take these medicines the morning of surgery with A SIP OF WATER:  Citalopram   Gabapentin    If needed Oxycodone , Ondansetron   Stop all vitamins and herbal supplements 7 days before surgery  DO NOT TAKE ANY ORAL DIABETIC MEDICATIONS DAY OF YOUR SURGERY  Hold Ozempic 7 days before surgery (do not take after 10-15-24)  Bring CPAP mask and tubing day of surgery.                              You may not have any metal on your body including  jewelry, and body piercing             Do not wear lotions, powders, cologne, or deodorant              Men may shave face and neck.   Do not bring valuables to the hospital. [facility] IS NOT RESPONSIBLE   FOR VALUABLES.   Contacts, dentures or bridgework may not be worn into surgery.   Bring small overnight bag day of surgery.   DO NOT BRING YOUR HOME MEDICATIONS TO THE HOSPITAL. PHARMACY WILL DISPENSE MEDICATIONS LISTED ON YOUR MEDICATION LIST TO YOU DURING YOUR ADMISSION IN THE HOSPITAL!    Special Instructions: Bring a copy of your healthcare power of attorney and living will documents the day of surgery if you haven't scanned them before.              Please read over the following fact sheets you were given: IF YOU HAVE QUESTIONS ABOUT YOUR PRE-OP INSTRUCTIONS PLEASE CALL [phone] Gwen  If you received a COVID test during your pre-op visit  it is requested that you wear a mask when out in public, stay away from anyone that may not be feeling well and notify your surgeon if you develop symptoms. If you test positive for Covid or have been in contact with anyone that has tested positive in the last 10 days please notify you surgeon.   Pre-operative 4 CHG Bath Instructions  DYNA-Hex 4  Chlorhexidine  Gluconate 4% Solution Antiseptic 4 fl. oz   You can play a key role in reducing the risk of infection after surgery. Your skin needs to be as free of germs as possible. You can reduce the number of germs on your skin by washing with CHG (chlorhexidine  gluconate) soap before surgery. CHG is an antiseptic soap that kills germs and continues to kill germs even after washing.   DO NOT use if you have an allergy to chlorhexidine /CHG or antibacterial soaps. If your skin becomes reddened or irritated, stop using the CHG and notify one of our RNs at   Please shower with the CHG soap starting 4 days before surgery using the following schedule:     Please keep in mind the following:  DO NOT shave, including legs and underarms, starting the day of your first shower.   You may shave your face at any point before/day of surgery.  Place clean sheets on your bed the day you start using CHG soap. Use a clean washcloth (not used since being washed) for each shower. DO NOT sleep with pets once you start using the CHG.  CHG Shower Instructions:  If you choose to wash your hair and private area, wash first with your normal shampoo/soap.  After you use shampoo/soap, rinse your hair and body thoroughly to remove shampoo/soap residue.  Turn the water OFF and apply about 3 tablespoons (45 ml) of CHG soap to a CLEAN washcloth.  Apply CHG soap  ONLY FROM YOUR NECK DOWN TO YOUR TOES (washing for 3-5 minutes)  DO NOT use CHG soap on face, private areas, open wounds, or sores.  Pay special attention to the area where your surgery is being performed.  If you are having back surgery, having someone wash your back for you may be helpful. Wait 2 minutes after CHG soap is applied, then you may rinse off the CHG soap.  Pat dry with a clean towel  Put on clean clothes/pajamas   If you choose to wear lotion, please use ONLY the CHG-compatible lotions on the back of this paper.     Additional instructions for  the day of surgery: DO NOT APPLY any lotions, deodorants, cologne, or perfumes.   Put on clean/comfortable clothes.  Brush your teeth.  Ask your nurse before applying any prescription medications to the skin.   CHG Compatible Lotions   Aveeno Moisturizing lotion  Cetaphil Moisturizing Cream  Cetaphil Moisturizing Lotion  Clairol Herbal Essence Moisturizing Lotion, Dry Skin  Clairol Herbal Essence Moisturizing Lotion, Extra Dry Skin  Clairol Herbal Essence Moisturizing Lotion, Normal Skin  Curel Age Defying Therapeutic Moisturizing Lotion with Alpha Hydroxy  Curel Extreme Care Body Lotion  Curel Soothing Hands Moisturizing Hand Lotion  Curel Therapeutic Moisturizing Cream, Fragrance-Free  Curel Therapeutic Moisturizing Lotion, Fragrance-Free  Curel Therapeutic Moisturizing Lotion, Original Formula  Eucerin Daily Replenishing Lotion  Eucerin Dry Skin Therapy Plus Alpha Hydroxy Crme  Eucerin Dry Skin Therapy Plus Alpha Hydroxy Lotion  Eucerin Original Crme  Eucerin Original Lotion  Eucerin Plus Crme Eucerin Plus Lotion  Eucerin TriLipid Replenishing Lotion  Keri Anti-Bacterial Hand Lotion  Keri Deep Conditioning Original Lotion Dry Skin Formula Softly Scented  Keri Deep Conditioning Original Lotion, Fragrance Free Sensitive Skin Formula  Keri Lotion Fast Absorbing Fragrance Free Sensitive Skin Formula  Keri Lotion Fast Absorbing Softly Scented Dry Skin Formula  Keri Original Lotion  Keri Skin Renewal Lotion Keri Silky Smooth Lotion  Keri Silky Smooth Sensitive Skin Lotion  Nivea Body Creamy Conditioning Oil  Nivea Body Extra Enriched Lotion  Nivea Body Original Lotion  Nivea Body Sheer Moisturizing Lotion Nivea Crme  Nivea Skin Firming Lotion  NutraDerm 30 Skin Lotion  NutraDerm Skin Lotion  NutraDerm Therapeutic Skin Cream  NutraDerm Therapeutic Skin Lotion  ProShield Protective Hand Cream  Provon moisturizing lotion   PATIENT  SIGNATURE_________________________________  NURSE SIGNATURE__________________________________  ________________________________________________________________________    Shane Erickson  An incentive spirometer is a tool that can help keep your lungs clear and active. This tool measures how well you are filling your lungs with each breath. Taking long deep breaths may help reverse or decrease the chance of developing breathing (pulmonary) problems (especially infection) following: A long period of time when you are unable to move or be active. BEFORE THE PROCEDURE  If the spirometer includes an indicator to show your best effort, your nurse or respiratory therapist will set it to a desired goal. If possible, sit up straight or lean slightly forward. Try not to slouch. Hold the incentive spirometer in an upright position. INSTRUCTIONS FOR USE  Sit on the edge of your bed if possible, or sit up as far as you can in bed or on a chair. Hold the incentive spirometer in an upright position. Breathe out normally. Place the mouthpiece in your mouth and seal your lips tightly around it. Breathe in slowly and as deeply as possible, raising the piston or the ball toward the top of the column. Hold your breath for  3-5 seconds or for as long as possible. Allow the piston or ball to fall to the bottom of the column. Remove the mouthpiece from your mouth and breathe out normally. Rest for a few seconds and repeat Steps 1 through 7 at least 10 times every 1-2 hours when you are awake. Take your time and take a few normal breaths between deep breaths. The spirometer may include an indicator to show your best effort. Use the indicator as a goal to work toward during each repetition. After each set of 10 deep breaths, practice coughing to be sure your lungs are clear. If you have an incision (the cut made at the time of surgery), support your incision when coughing by placing a pillow or rolled up towels  firmly against it. Once you are able to get out of bed, walk around indoors and cough well. You may stop using the incentive spirometer when instructed by your caregiver.  RISKS AND COMPLICATIONS Take your time so you do not get dizzy or light-headed. If you are in pain, you may need to take or ask for pain medication before doing incentive spirometry. It is harder to take a deep breath if you are having pain. AFTER USE Rest and breathe slowly and easily. It can be helpful to keep track of a log of your progress. Your caregiver can provide you with a simple table to help with this. If you are using the spirometer at home, follow these instructions: SEEK MEDICAL CARE IF:  You are having difficultly using the spirometer. You have trouble using the spirometer as often as instructed. Your pain medication is not giving enough relief while using the spirometer. You develop fever of 100.5 F (38.1 C) or higher. SEEK IMMEDIATE MEDICAL CARE IF:  You cough up bloody sputum that had not been present before. You develop fever of 102 F (38.9 C) or greater. You develop worsening pain at or near the incision site. MAKE SURE YOU:  Understand these instructions. Will watch your condition. Will get help right away if you are not doing well or get worse. Document Released: 04/03/2007 Document Revised: 02/13/2012 Document Reviewed: 06/04/2007 [employer] Patient Information 2014 [employer], [place].   ________________________________________________________________________ WHAT IS A BLOOD TRANSFUSION? Blood Transfusion Information  A transfusion is the replacement of blood or some of its parts. Blood is made up of multiple cells which provide different functions. Red blood cells carry oxygen and are used for blood loss replacement. White blood cells fight against infection. Platelets control bleeding. Plasma helps clot blood. Other blood products are available for specialized needs, such as hemophilia or  other clotting disorders. BEFORE THE TRANSFUSION  Who gives blood for transfusions?  Healthy volunteers who are fully evaluated to make sure their blood is safe. This is blood bank blood. Transfusion therapy is the safest it has ever been in the practice of medicine. Before blood is taken from a donor, a complete history is taken to make sure that person has no history of diseases nor engages in risky social behavior (examples are intravenous drug use or sexual activity with multiple partners). The donor's travel history is screened to minimize risk of transmitting infections, such as malaria. The donated blood is tested for signs of infectious diseases, such as HIV and hepatitis. The blood is then tested to be sure it is compatible with you in order to minimize the chance of a transfusion reaction. If you or a relative donates blood, this is often done in anticipation of surgery and  is not appropriate for emergency situations. It takes many days to process the donated blood. RISKS AND COMPLICATIONS Although transfusion therapy is very safe and saves many lives, the main dangers of transfusion include:  Getting an infectious disease. Developing a transfusion reaction. This is an allergic reaction to something in the blood you were given. Every precaution is taken to prevent this. The decision to have a blood transfusion has been considered carefully by your caregiver before blood is given. Blood is not given unless the benefits outweigh the risks. AFTER THE TRANSFUSION Right after receiving a blood transfusion, you will usually feel much better and more energetic. This is especially true if your red blood cells have gotten low (anemic). The transfusion raises the level of the red blood cells which carry oxygen, and this usually causes an energy increase. The nurse administering the transfusion will monitor you carefully for complications. HOME CARE INSTRUCTIONS  No special instructions are needed after  a transfusion. You may find your energy is better. Speak with your caregiver about any limitations on activity for underlying diseases you may have. SEEK MEDICAL CARE IF:  Your condition is not improving after your transfusion. You develop redness or irritation at the intravenous (IV) site. SEEK IMMEDIATE MEDICAL CARE IF:  Any of the following symptoms occur over the next 12 hours: Shaking chills. You have a temperature by mouth above 102 F (38.9 C), not controlled by medicine. Chest, back, or muscle pain. People around you feel you are not acting correctly or are confused. Shortness of breath or difficulty breathing. Dizziness and fainting. You get a rash or develop hives. You have a decrease in urine output. Your urine turns a dark color or changes to pink, red, or brown. Any of the following symptoms occur over the next 10 days: You have a temperature by mouth above 102 F (38.9 C), not controlled by medicine. Shortness of breath. Weakness after normal activity. The white part of the eye turns yellow (jaundice). You have a decrease in the amount of urine or are urinating less often. Your urine turns a dark color or changes to pink, red, or brown. Document Released: 11/18/2000 Document Revised: 02/13/2012 Document Reviewed: 07/07/2008 [facility] Patient Information 2014 [place], [place].  _______________________________________________________________________

## 2024-10-10 ENCOUNTER — Encounter (HOSPITAL_COMMUNITY)
Admission: RE | Admit: 2024-10-10 | Discharge: 2024-10-10 | Disposition: A | Discharge status: Home / Self Care | Source: Ambulatory Visit | Attending: Orthopedic Surgery | Admitting: Orthopedic Surgery

## 2024-10-10 DIAGNOSIS — K769 Liver disease, unspecified: Secondary | ICD-10-CM

## 2024-10-10 DIAGNOSIS — Z01818 Encounter for other preprocedural examination: Secondary | ICD-10-CM

## 2024-10-10 DIAGNOSIS — I10 Essential (primary) hypertension: Secondary | ICD-10-CM

## 2024-10-21 ENCOUNTER — Other Ambulatory Visit: Payer: Self-pay

## 2024-10-21 ENCOUNTER — Ambulatory Visit (HOSPITAL_BASED_OUTPATIENT_CLINIC_OR_DEPARTMENT_OTHER): Payer: Worker's Compensation | Admitting: Physical Therapy

## 2024-10-21 ENCOUNTER — Encounter (HOSPITAL_BASED_OUTPATIENT_CLINIC_OR_DEPARTMENT_OTHER): Payer: Self-pay | Admitting: Physical Therapy

## 2024-10-21 ENCOUNTER — Ambulatory Visit (HOSPITAL_BASED_OUTPATIENT_CLINIC_OR_DEPARTMENT_OTHER): Payer: Worker's Compensation | Attending: Orthopedic Surgery | Admitting: Physical Therapy

## 2024-10-21 DIAGNOSIS — M6281 Muscle weakness (generalized): Secondary | ICD-10-CM | POA: Insufficient documentation

## 2024-10-21 DIAGNOSIS — M25562 Pain in left knee: Secondary | ICD-10-CM | POA: Diagnosis present

## 2024-10-21 DIAGNOSIS — M5459 Other low back pain: Secondary | ICD-10-CM | POA: Diagnosis present

## 2024-10-21 DIAGNOSIS — R2689 Other abnormalities of gait and mobility: Secondary | ICD-10-CM | POA: Diagnosis present

## 2024-10-21 DIAGNOSIS — M25561 Pain in right knee: Secondary | ICD-10-CM | POA: Insufficient documentation

## 2024-10-21 DIAGNOSIS — G8929 Other chronic pain: Secondary | ICD-10-CM | POA: Insufficient documentation

## 2024-10-21 NOTE — Therapy (Signed)
 OUTPATIENT PHYSICAL THERAPY THORACOLUMBAR EVALUATION   Patient Name: Shane Erickson MRN: [id number] DOB:[birth year], 48 y.o., male Today's Date: 10/21/2024  END OF SESSION:  PT End of Session - 10/21/24 1136     Visit Number 1    Number of Visits 12    Date for Recertification  12/06/24    Authorization Type workers comp    PT Start Time 1100    PT Stop Time 1135    PT Time Calculation (min) 35 min    Activity Tolerance Patient tolerated treatment well    Behavior During Therapy WFL for tasks assessed/performed          Past Medical History:  Diagnosis Date   Anal fissure 03/05/2016   Hypertension    states under control with med., has been on med. x 2 yr.   Metabolic dysfunction-associated steatohepatitis (MASH)    Obesity (BMI 30-39.9)    OSA on CPAP    Pre-diabetes    Past Surgical History:  Procedure Laterality Date   ARTHROSCOPY, ANKLE WITH DEBRIDEMENT Right 06/19/2024   Procedure: ARTHROSCOPY, ANKLE WITH DEBRIDEMENT;  Surgeon: Barton Drape, MD;  Location: [facility];  Service: Orthopedics;  Laterality: Right;  right ankle arthroscopy with peroneal tendons debridement with possible repair and/or transfer   INJECTION, CHEMODENERVATION AGENT N/A 08/13/2024   Procedure: INJECTION, CHEMODENERVATION AGENT;  Surgeon: Jordis Laneta FALCON, MD;  Location: [facility];  Service: General;  Laterality: N/A;   MENISCUS REPAIR Left 03/22/2022   RECTAL EXAM UNDER ANESTHESIA N/A 08/13/2024   Procedure: EXAM UNDER ANESTHESIA, RECTUM;  Surgeon: Jordis Laneta FALCON, MD;  Location: [facility];  Service: General;  Laterality: N/A;   SHOULDER ARTHROSCOPY WITH DISTAL CLAVICLE RESECTION Right 02/09/2024   Procedure: SHOULDER ARTHROSCOPY WITH DISTAL CLAVICLE RESECTION;  Surgeon: Sharl Selinda Dover, MD;  Location: [facility];  Service: Orthopedics;  Laterality: Right;   SHOULDER ARTHROSCOPY WITH SUBACROMIAL DECOMPRESSION Right 02/09/2024   Procedure: SHOULDER ARTHROSCOPY WITH  SUBACROMIAL DECOMPRESSION;  Surgeon: Sharl Selinda Dover, MD;  Location: [facility];  Service: Orthopedics;  Laterality: Right;   SPHINCTEROTOMY N/A 04/07/2016   Procedure: LATERAL INTERNAL SPHINCTEROTOMY;  Surgeon: Donnice Lunger, MD;  Location:  SURGERY CENTER;  Service: General;  Laterality: N/A;   TENDON TRANSFER Right 06/19/2024   Procedure: TRANSFER, TENDON;  Surgeon: Barton Drape, MD;  Location:  SURGERY CENTER;  Service: Orthopedics;  Laterality: Right;   UMBILICAL HERNIA REPAIR  12/05/2010   Patient Active Problem List   Diagnosis Date Noted   Anal fissure 08/13/2024   Metabolic dysfunction-associated steatohepatitis (MASH) 02/27/2024   Prediabetes 02/27/2024   OSA (obstructive sleep apnea) - possibly severe, on CPAP 02/27/2024   Primary hypertension 02/27/2024   Class 3 severe obesity with serious comorbidity and body mass index (BMI) of 40.0 to 44.9 in adult [facility]) 02/27/2024   Decreased GFR 02/27/2024   Hx of rectal sphincterotomy May 2017 04/07/2016   Paresthesia 04/29/2014    PCP: Nell Piety FNP   REFERRING PROVIDER: Donaciano Sprang MD  REFERRING DIAG: M54.2 (ICD-10-CM) - Cervicalgia  M17.0 (ICD-10-CM) - Bilateral primary osteoarthritis of knee   Rationale for Evaluation and Treatment: Rehabilitation  THERAPY DIAG:  Bilateral chronic knee pain  Other low back pain  Muscle weakness (generalized)  ONSET DATE: 08/14/23  SUBJECTIVE:  SUBJECTIVE STATEMENT: C5-7 messed up.  Should be orders for my neck, back and knees.  Need to have R THR after I lose 6 lbs, both hips need to be replaced. Going to pain management for my back.  Don't want to have any more surgery. Had ankle surgery 3 months ago. Doing land based PT at [facility] 2 x week (foe  ankle, back and hips). I have access to a pool near my home  PERTINENT HISTORY:  Fall 9/9 fell into hole at work. Had multiple injuries including right wrist fx. Hurt back both shoulders, both knees, hips and back. Had spinal injection in March did not relieve pain. May 22 another spine injection s.. June 2nd arthroscopic left knee surgery for meniscus repair.   02/09/2024 Surgery SHOULDER ARTHROSCOPY WITH SUBACROMIAL DECOMPRESSION   PAIN:  Are you having pain? Yes: NPRS scale: 6/10 Pain location: hip/back neck/ generalized Pain description: ache Aggravating factors: movement Relieving factors: unknown  PRECAUTIONS: None  RED FLAGS: None   WEIGHT BEARING RESTRICTIONS: No  FALLS:  Has patient fallen in last 6 months? No  LIVING ENVIRONMENT: Lives with: lives with their family Lives in: House/apartment Stairs: Yes: External: 16 steps; on right going up Has following equipment at home: None  OCCUPATION: out from work due to injury  PLOF: Independent  PATIENT GOALS: better movement, strengthening core  NEXT MD VISIT: as needed  OBJECTIVE:  Note: Objective measures were completed at Evaluation unless otherwise noted.  DIAGNOSTIC FINDINGS:  The MRI did show a central disc protrusion with left greater than right S1 nerve irritation.  Bone spur L5  PATIENT SURVEYS:  LEFS:18/80  COGNITION: Overall cognitive status: Within functional limits for tasks assessed     SENSATION: Numbness/tingling ulnar nerve distribution left Reports tingling bilat LE  POSTURE: rounded shoulders and forward head  PALPATION: Mild TTP joint lines bil knees   CERVICAL ROM:   Active ROM A/PROM (deg) eval  Flexion Limited 50%P!  Extension Limited 50%  Right lateral flexion Limited 50%P!  Left lateral flexion Limited 50% P!  Right rotation full  Left rotation Limited 25%   (Blank rows = not tested)  LUMBAR ROM:   AROM eval  Flexion FT to mid shin  Extension Full P!  Right  lateral flexion Limited 25%P!  Left lateral flexion Limited 25%P!  Right rotation   Left rotation    (Blank rows = not tested)  LOWER EXTREMITY ROM:     Active  Right eval Left eval  Hip flexion    Hip extension    Hip abduction    Hip adduction    Hip internal rotation    Hip external rotation    Knee flexion 115 122  Knee extension    Ankle dorsiflexion    Ankle plantarflexion    Ankle inversion    Ankle eversion     (Blank rows = not tested)  LOWER EXTREMITY MMT:    MMT Right eval Left eval  Hip flexion 25.4 37.3  Hip extension    Hip abduction 17.5 33.5  Hip adduction    Hip internal rotation    Hip external rotation    Knee flexion    Knee extension 32.6 24.0  Ankle dorsiflexion    Ankle plantarflexion    Ankle inversion    Ankle eversion     (Blank rows = not tested)  LUMBAR SPECIAL TESTS:  Slump test: Negative  FUNCTIONAL TESTS:  Timed up and go (TUG): 17.52   4 stage  balance: passed GAIT: Distance walked: 500 ft Assistive device utilized: None Level of assistance: Complete Independence Comments: bilat knee varus  TREATMENT  Eval Self care:Posture and optometrist instruction                                                                                                                              PATIENT EDUCATION:  Education details: Discussed eval findings, rehab rationale, aquatic program progression/POC and pools in area. Patient is in agreement  Person educated: Patient Education method: Explanation Education comprehension: verbalized understanding  HOME EXERCISE PROGRAM: Pt indep with land based PT as per [facility]  Aquatic assigned last episode  ut not issued/instructed Aquatic Access Code: [email or web address] URL: [email or web address] Date: 06/12/2024 Prepared by: Frankie Ermin Parisien  ASSESSMENT:  CLINICAL IMPRESSION: Patient is a 48 y.o. m who was seen today for physical therapy evaluation and treatment for  cervicalgia and bilat knee OA.  Pt is under workers comp and reports [facility] working land based on all areas including cervical spine.  He prefers to focus and knee and back pain in aquatics working cervical spine at [employer]. He is well known to this clinic being seen for 6 weeks not returning in early July due to having an additional surgical procedure on ankle.  Initial injury in Sept 2024 from fall at work with surgical interventions repairing left shoulder, right ankle and bilateral knees. He is pending surgical intervention for hips knees.  He reports continued back (cervical through Lumbar) pain from fall.  He VU of the purpose and benefits of using the properties of water to allow for movement, some strengthening and pain management.  Testing identifies weakness in his hips and core but balance looks to be intact.  Her has had no falls.  He is a good candidate for aquatic intervention for instruction and improvement on toleration to end range movement and usage of the properties of water for pain management.  He understands he will need pool access going forward to benefit from long term pain management using water for his chronic conditions.    Of Note: his strength as compared to last episode in hips have reduced but in knees have improved.  OBJECTIVE IMPAIRMENTS: Abnormal gait, decreased activity tolerance, decreased balance, decreased endurance, decreased mobility, difficulty walking, decreased strength, and pain.    ACTIVITY LIMITATIONS: carrying, lifting, bending, sitting, standing, squatting, sleeping, stairs, transfers, and locomotion level   PARTICIPATION LIMITATIONS: meal prep, cleaning, laundry, shopping, community activity, occupation, and yard work   PERSONAL FACTORS: Multiple orthopedic issues being delt with at same time are also affecting patient's functional outcome.    REHAB POTENTIAL: Good   CLINICAL DECISION MAKING: Evolving/moderate complexity   EVALUATION  COMPLEXITY: Moderate   GOALS: Goals reviewed with patient? Yes  SHORT TERM GOALS: Target date: 11/05/24  Pt will tolerate full aquatic sessions consistently without increase in pain and with improving function to demonstrate good toleration and effectiveness of intervention.  Baseline: Goal status: INITIAL  2.  Pt will consider gaining pool access for use of the properties of water for chronic conditions maintaining mobility and minimizing pain. Baseline:  Goal status: INITIAL    LONG TERM GOALS: Target date: 12/07/23  Pt to improve on LEFS by at least 9 point to demonstrate statistically significant Improvement in function. Baseline: 18/80 Goal status: INITIAL  2.  Pt will be indep with final aquatic HEP for continued management of condition Baseline:  Goal status: INITIAL  3.  Pt will report decrease in pain by at least 50% (all areas) for improved toleration to activity/quality of life and to demonstrate improved management of pain.  Baseline:  Goal status: INITIAL  4.  Pt will improve strength in all areas listed by at least 5 lbs  to demonstrate improved overall physical function Baseline:  Goal status: INITIAL   PLAN:  PT FREQUENCY: 2x/week  PT DURATION: 6 weeks  PLANNED INTERVENTIONS: [id number]- PT Re-evaluation, 97750- Physical Performance Testing, 97110-Therapeutic exercises, 97530- Therapeutic activity, 97112- Neuromuscular re-education, 97535- Self Care, [id number]- Manual therapy, [id number]- Gait training, [phone]- Aquatic Therapy, [phone] (1-2 muscles), [id number] (3+ muscles)- Dry Needling, Patient/Family education, Balance training, Stair training, Taping, Joint mobilization, DME instructions, Cryotherapy, and Moist heat.  PLAN FOR NEXT SESSION: aquatic only: le/core strengthening/stretching; pain management; HEP   Ronal Foots) Cindra Austad MPT 10/21/24 1:11 PM [facility] [address] [place], [place], [address] Phone:  [phone]   Fax:  [phone]

## 2024-10-23 ENCOUNTER — Ambulatory Visit (HOSPITAL_COMMUNITY): Admit: 2024-10-23 | Payer: Worker's Compensation | Admitting: Orthopedic Surgery

## 2024-10-23 SURGERY — ARTHROPLASTY, HIP, TOTAL, ANTERIOR APPROACH
Anesthesia: Choice | Site: Hip | Laterality: Right

## 2024-10-29 ENCOUNTER — Ambulatory Visit (HOSPITAL_BASED_OUTPATIENT_CLINIC_OR_DEPARTMENT_OTHER): Payer: Worker's Compensation | Admitting: Physical Therapy

## 2024-10-29 ENCOUNTER — Encounter (HOSPITAL_BASED_OUTPATIENT_CLINIC_OR_DEPARTMENT_OTHER): Payer: Self-pay | Admitting: Physical Therapy

## 2024-10-29 DIAGNOSIS — G8929 Other chronic pain: Secondary | ICD-10-CM

## 2024-10-29 DIAGNOSIS — R2689 Other abnormalities of gait and mobility: Secondary | ICD-10-CM

## 2024-10-29 DIAGNOSIS — M6281 Muscle weakness (generalized): Secondary | ICD-10-CM

## 2024-10-29 DIAGNOSIS — M25561 Pain in right knee: Secondary | ICD-10-CM | POA: Diagnosis not present

## 2024-10-29 DIAGNOSIS — M5459 Other low back pain: Secondary | ICD-10-CM

## 2024-10-29 NOTE — Therapy (Signed)
 OUTPATIENT PHYSICAL THERAPY THORACOLUMBAR TREATMENT   Patient Name: Shane Erickson MRN: [id number] DOB:07-May-1976, 48 y.o., male Today's Date: 10/29/2024  END OF SESSION:  PT End of Session - 10/29/24 0808     Visit Number 2    Number of Visits 12    Date for Recertification  12/06/24    Authorization Type workers comp    PT Start Time 0800    PT Stop Time 0840    PT Time Calculation (min) 40 min    Activity Tolerance Patient tolerated treatment well    Behavior During Therapy WFL for tasks assessed/performed          Past Medical History:  Diagnosis Date   Anal fissure 03/05/2016   Hypertension    states under control with med., has been on med. x 2 yr.   Metabolic dysfunction-associated steatohepatitis (MASH)    Obesity (BMI 30-39.9)    OSA on CPAP    Pre-diabetes    Past Surgical History:  Procedure Laterality Date   ARTHROSCOPY, ANKLE WITH DEBRIDEMENT Right 06/19/2024   Procedure: ARTHROSCOPY, ANKLE WITH DEBRIDEMENT;  Surgeon: Barton Drape, MD;  Location: [facility];  Service: Orthopedics;  Laterality: Right;  right ankle arthroscopy with peroneal tendons debridement with possible repair and/or transfer   INJECTION, CHEMODENERVATION AGENT N/A 08/13/2024   Procedure: INJECTION, CHEMODENERVATION AGENT;  Surgeon: Jordis Laneta FALCON, MD;  Location: [facility];  Service: General;  Laterality: N/A;   MENISCUS REPAIR Left 03/22/2022   RECTAL EXAM UNDER ANESTHESIA N/A 08/13/2024   Procedure: EXAM UNDER ANESTHESIA, RECTUM;  Surgeon: Jordis Laneta FALCON, MD;  Location: [facility];  Service: General;  Laterality: N/A;   SHOULDER ARTHROSCOPY WITH DISTAL CLAVICLE RESECTION Right 02/09/2024   Procedure: SHOULDER ARTHROSCOPY WITH DISTAL CLAVICLE RESECTION;  Surgeon: Sharl Selinda Dover, MD;  Location: [facility];  Service: Orthopedics;  Laterality: Right;   SHOULDER ARTHROSCOPY WITH SUBACROMIAL DECOMPRESSION Right 02/09/2024   Procedure: SHOULDER ARTHROSCOPY WITH  SUBACROMIAL DECOMPRESSION;  Surgeon: Sharl Selinda Dover, MD;  Location: [facility];  Service: Orthopedics;  Laterality: Right;   SPHINCTEROTOMY N/A 04/07/2016   Procedure: LATERAL INTERNAL SPHINCTEROTOMY;  Surgeon: Donnice Lunger, MD;  Location: [facility];  Service: General;  Laterality: N/A;   TENDON TRANSFER Right 06/19/2024   Procedure: TRANSFER, TENDON;  Surgeon: Barton Drape, MD;  Location: [facility];  Service: Orthopedics;  Laterality: Right;   UMBILICAL HERNIA REPAIR  12/05/2010   Patient Active Problem List   Diagnosis Date Noted   Anal fissure 08/13/2024   Metabolic dysfunction-associated steatohepatitis (MASH) 02/27/2024   Prediabetes 02/27/2024   OSA (obstructive sleep apnea) - possibly severe, on CPAP 02/27/2024   Primary hypertension 02/27/2024   Class 3 severe obesity with serious comorbidity and body mass index (BMI) of 40.0 to 44.9 in adult [facility]) 02/27/2024   Decreased GFR 02/27/2024   Hx of rectal sphincterotomy May 2017 04/07/2016   Paresthesia 04/29/2014    PCP: Nell Piety FNP   REFERRING PROVIDER: Donaciano Sprang MD  REFERRING DIAG: M54.2 (ICD-10-CM) - Cervicalgia  M17.0 (ICD-10-CM) - Bilateral primary osteoarthritis of knee   Rationale for Evaluation and Treatment: Rehabilitation  THERAPY DIAG:  Bilateral chronic knee pain  Other low back pain  Muscle weakness (generalized)  Other abnormalities of gait and mobility  ONSET DATE: 08/14/23  SUBJECTIVE:  SUBJECTIVE STATEMENT: Pt reports no new changes since initial evaluation.   POOL ACCESS: [employer] member  Initial subjective statement:  C5-7 messed up.  Should be orders for my neck, back and knees.  Need to have R THR after I lose 6 lbs, both hips need to be replaced.  Going to pain management for my back.  Don't want to have any more surgery. Had ankle surgery 3 months ago. Doing land based PT at [facility] 2 x week (foe ankle, back and hips). I have access to a pool near my home  PERTINENT HISTORY:  Fall 9/9 fell into hole at work. Had multiple injuries including right wrist fx. Hurt back both shoulders, both knees, hips and back. Had spinal injection in March did not relieve pain. May 22 another spine injection s.. June 2nd arthroscopic left knee surgery for meniscus repair.   02/09/2024 Surgery SHOULDER ARTHROSCOPY WITH SUBACROMIAL DECOMPRESSION   PAIN:  Are you having pain? Yes: NPRS scale: 6/10 Pain location: hip/back neck/ generalized Pain description: ache, tingling into Lt hand and RLE to foot Aggravating factors: movement Relieving factors: unknown  PRECAUTIONS: None  RED FLAGS: None   WEIGHT BEARING RESTRICTIONS: No  FALLS:  Has patient fallen in last 6 months? No  LIVING ENVIRONMENT: Lives with: lives with their family Lives in: House/apartment Stairs: Yes: External: 16 steps; on right going up Has following equipment at home: None  OCCUPATION: out from work due to injury  PLOF: Independent  PATIENT GOALS: better movement, strengthening core  NEXT MD VISIT: as needed  OBJECTIVE:  Note: Objective measures were completed at Evaluation unless otherwise noted.  DIAGNOSTIC FINDINGS:  The MRI did show a central disc protrusion with left greater than right S1 nerve irritation.  Bone spur L5  PATIENT SURVEYS:  LEFS:18/80  COGNITION: Overall cognitive status: Within functional limits for tasks assessed     SENSATION: Numbness/tingling ulnar nerve distribution left Reports tingling bilat LE  POSTURE: rounded shoulders and forward head  PALPATION: Mild TTP joint lines bil knees   CERVICAL ROM:   Active ROM A/PROM (deg) eval  Flexion Limited 50%P!  Extension Limited 50%  Right lateral flexion Limited 50%P!  Left  lateral flexion Limited 50% P!  Right rotation full  Left rotation Limited 25%   (Blank rows = not tested)  LUMBAR ROM:   AROM eval  Flexion FT to mid shin  Extension Full P!  Right lateral flexion Limited 25%P!  Left lateral flexion Limited 25%P!  Right rotation   Left rotation    (Blank rows = not tested)  LOWER EXTREMITY ROM:     Active  Right eval Left eval  Hip flexion    Hip extension    Hip abduction    Hip adduction    Hip internal rotation    Hip external rotation    Knee flexion 115 122  Knee extension    Ankle dorsiflexion    Ankle plantarflexion    Ankle inversion    Ankle eversion     (Blank rows = not tested)  LOWER EXTREMITY MMT:    MMT Right eval Left eval  Hip flexion 25.4 37.3  Hip extension    Hip abduction 17.5 33.5  Hip adduction    Hip internal rotation    Hip external rotation    Knee flexion    Knee extension 32.6 24.0  Ankle dorsiflexion    Ankle plantarflexion    Ankle inversion    Ankle eversion     (  Blank rows = not tested)  LUMBAR SPECIAL TESTS:  Slump test: Negative  FUNCTIONAL TESTS:  Timed up and go (TUG): 17.52   4 stage balance: passed GAIT: Distance walked: 500 ft Assistive device utilized: None Level of assistance: Complete Independence Comments: bilat knee varus  TREATMENT  [facility] Adult PT Treatment:                                             Date: 10/29/24 Pt seen for aquatic therapy today.  Treatment took place in water 3.5-4.75 ft in depth at the [facility] pool. Temp of water was 91.  Pt entered/exited the pool via stairs independently with bil rail.  - unsupported walking forward/ backward with arm swing, multiple laps  - unsupported side stepping -> with arm add/abdct-> with yellow hand floats -> rainbow hand floats - suitcase carry with bil/ single rainbow hand float and walking forward/backward ->repeated single yellow hand floats - UE on wall: alternating hamstring curls x 10; alternating  single leg clams x 10;  - UE on wall: 2 sets-- toe/heel raises x10; hip add/abdct x10 ; hip flexion /extension x10;   - return to walking forward/ backward with reciprocal arm swing  - TrA set with alternating rainbow hand float pull down to thighs x 10  - staggered stance with horz abdct/add with rainbow hand floats x 8 each side - R/L hamstring stretch at stairs, 3 x each LE, heel on 2nd step with ankle in DF, 15sec   Pt requires the buoyancy and hydrostatic pressure of water for support, and to offload joints by unweighting joint load by at least 50 % in navel deep water and by at least 75-80% in chest to neck deep water.  Viscosity of the water is needed for resistance of strengthening. Water current perturbations provides challenge to standing balance requiring increased core activation.                                                                                                                                PATIENT EDUCATION:  Education details: reacquainting to aquatic therapy  Person educated: Patient Education method: Explanation Education comprehension: verbalized understanding  HOME EXERCISE PROGRAM: Pt indep with land based PT as per [facility]  Aquatic assigned last episode  but not issued/instructed Aquatic Access Code: [email or web address] URL: [email or web address] Date: 06/12/2024 Prepared by: Frankie Ziemba  ASSESSMENT:  CLINICAL IMPRESSION: Pt demonstrates safety and independence in aquatic setting with therapist instructing from deck. Pt is confident in setting, moving throughout all depths easily.  Pt is directed through various movement patterns in standing positions with good tolerance.   Pt is provided VC and demonstration throughout session for execution of exercises.Pt reported reduction of tightness and pain (to 2/10) during session. Encouraged pt to bring in land based exercise papers for understanding  of what he is doing outside aquatic visits.   Goals are ongoing.     From initial evaluation:  Patient is a 48 y.o. m who was seen today for physical therapy evaluation and treatment for cervicalgia and bilat knee OA.  Pt is under workers comp and reports [facility] working land based on all areas including cervical spine.  He prefers to focus and knee and back pain in aquatics working cervical spine at [employer]. He is well known to this clinic being seen for 6 weeks not returning in early July due to having an additional surgical procedure on ankle.  Initial injury in Sept 2024 from fall at work with surgical interventions repairing left shoulder, right ankle and bilateral knees. He is pending surgical intervention for hips knees.  He reports continued back (cervical through Lumbar) pain from fall.  He VU of the purpose and benefits of using the properties of water to allow for movement, some strengthening and pain management.  Testing identifies weakness in his hips and core but balance looks to be intact.  Her has had no falls.  He is a good candidate for aquatic intervention for instruction and improvement on toleration to end range movement and usage of the properties of water for pain management.  He understands he will need pool access going forward to benefit from long term pain management using water for his chronic conditions.    Of Note: his strength as compared to last episode in hips have reduced but in knees have improved.  OBJECTIVE IMPAIRMENTS: Abnormal gait, decreased activity tolerance, decreased balance, decreased endurance, decreased mobility, difficulty walking, decreased strength, and pain.    ACTIVITY LIMITATIONS: carrying, lifting, bending, sitting, standing, squatting, sleeping, stairs, transfers, and locomotion level   PARTICIPATION LIMITATIONS: meal prep, cleaning, laundry, shopping, community activity, occupation, and yard work   PERSONAL FACTORS: Multiple orthopedic issues being delt with at same time are also  affecting patient's functional outcome.    REHAB POTENTIAL: Good   CLINICAL DECISION MAKING: Evolving/moderate complexity   EVALUATION COMPLEXITY: Moderate   GOALS: Goals reviewed with patient? Yes  SHORT TERM GOALS: Target date: 11/05/24  Pt will tolerate full aquatic sessions consistently without increase in pain and with improving function to demonstrate good toleration and effectiveness of intervention.  Baseline: Goal status: INITIAL  2.  Pt will consider gaining pool access for use of the properties of water for chronic conditions maintaining mobility and minimizing pain. Baseline:  Goal status: INITIAL    LONG TERM GOALS: Target date: 12/07/23  Pt to improve on LEFS by at least 9 point to demonstrate statistically significant Improvement in function. Baseline: 18/80 Goal status: INITIAL  2.  Pt will be indep with final aquatic HEP for continued management of condition Baseline:  Goal status: INITIAL  3.  Pt will report decrease in pain by at least 50% (all areas) for improved toleration to activity/quality of life and to demonstrate improved management of pain.  Baseline:  Goal status: INITIAL  4.  Pt will improve strength in all areas listed by at least 5 lbs  to demonstrate improved overall physical function Baseline:  Goal status: INITIAL   PLAN:  PT FREQUENCY: 2x/week  PT DURATION: 6 weeks  PLANNED INTERVENTIONS: 97164- PT Re-evaluation, 97750- Physical Performance Testing, 97110-Therapeutic exercises, 97530- Therapeutic activity, [id number]- Neuromuscular re-education, 97535- Self Care, [id number]- Manual therapy, [id number]- Gait training, [phone]- Aquatic Therapy, [phone] (1-2 muscles), 20561 (3+ muscles)- Dry Needling, Patient/Family education, Balance training, Stair training, Taping, Joint  mobilization, DME instructions, Cryotherapy, and Moist heat.  PLAN FOR NEXT SESSION: aquatic only: le/core strengthening/stretching; pain management; HEP  Delon Aquas,  PTA 10/29/24 8:46 AM [facility] [address] [place], [place], [address] Phone: [phone]   Fax:  [phone]

## 2024-11-04 ENCOUNTER — Encounter: Payer: Self-pay | Admitting: Surgery

## 2024-11-04 ENCOUNTER — Telehealth: Payer: Self-pay | Admitting: Surgery

## 2024-11-04 ENCOUNTER — Ambulatory Visit: Admitting: Surgery

## 2024-11-04 VITALS — BP 135/92 | HR 89 | Ht 72.0 in | Wt 313.0 lb

## 2024-11-04 DIAGNOSIS — K602 Anal fissure, unspecified: Secondary | ICD-10-CM | POA: Diagnosis not present

## 2024-11-04 NOTE — Progress Notes (Unsigned)
 Outpatient Surgical Follow Up  11/04/2024  Shane Erickson is an 48 y.o. male.   Chief Complaint  Patient presents with  . Follow-up    HPI: ***  Past Medical History:  Diagnosis Date  . Anal fissure 03/05/2016  . Hypertension    states under control with med., has been on med. x 2 yr.  . Metabolic dysfunction-associated steatohepatitis (MASH)   . Obesity (BMI 30-39.9)   . OSA on CPAP   . Pre-diabetes     Past Surgical History:  Procedure Laterality Date  . ARTHROSCOPY, ANKLE WITH DEBRIDEMENT Right 06/19/2024   Procedure: ARTHROSCOPY, ANKLE WITH DEBRIDEMENT;  Surgeon: Barton Drape, MD;  Location: [facility];  Service: Orthopedics;  Laterality: Right;  right ankle arthroscopy with peroneal tendons debridement with possible repair and/or transfer  . INJECTION, CHEMODENERVATION AGENT N/A 08/13/2024   Procedure: INJECTION, CHEMODENERVATION AGENT;  Surgeon: Jordis Laneta FALCON, MD;  Location: [facility];  Service: General;  Laterality: N/A;  . MENISCUS REPAIR Left 03/22/2022  . RECTAL EXAM UNDER ANESTHESIA N/A 08/13/2024   Procedure: EXAM UNDER ANESTHESIA, RECTUM;  Surgeon: Jordis Laneta FALCON, MD;  Location: [facility];  Service: General;  Laterality: N/A;  . SHOULDER ARTHROSCOPY WITH DISTAL CLAVICLE RESECTION Right 02/09/2024   Procedure: SHOULDER ARTHROSCOPY WITH DISTAL CLAVICLE RESECTION;  Surgeon: Sharl Selinda Dover, MD;  Location: [facility];  Service: Orthopedics;  Laterality: Right;  . SHOULDER ARTHROSCOPY WITH SUBACROMIAL DECOMPRESSION Right 02/09/2024   Procedure: SHOULDER ARTHROSCOPY WITH SUBACROMIAL DECOMPRESSION;  Surgeon: Sharl Selinda Dover, MD;  Location: [facility];  Service: Orthopedics;  Laterality: Right;  . SPHINCTEROTOMY N/A 04/07/2016   Procedure: LATERAL INTERNAL SPHINCTEROTOMY;  Surgeon: Donnice Lunger, MD;  Location: [facility];  Service: General;  Laterality: N/A;  . TENDON TRANSFER Right 06/19/2024   Procedure:  TRANSFER, TENDON;  Surgeon: Barton Drape, MD;  Location: [facility];  Service: Orthopedics;  Laterality: Right;  . UMBILICAL HERNIA REPAIR  12/05/2010    Family History  Problem Relation Age of Onset  . Kidney disease Father   . CVA Father   . Heart attack Father   . Hypertension Father   . CAD Father   . Thyroid disease Mother   . Multiple sclerosis Sister   . CAD Paternal Uncle     Social History:  reports that he has quit smoking. His smoking use included e-cigarettes. He has been exposed to tobacco smoke. He has never used smokeless tobacco. He reports current alcohol use. He reports that he does not use drugs.  Allergies:  Allergies  Allergen Reactions  . Chapstick Rash    BLISTERS  . Sunscreens Rash    BLISTERS  . Porcine (Pork) Protein-Containing Drug Products Other (See Comments)    Preference   . Soap Itching    [place]    Medications reviewed.    ROS Full ROS performed and is otherwise negative other than what is stated in HPI   BP (!) 135/92   Pulse 89   Ht 6' (1.829 m)   Wt (!) 313 lb (142 kg)   SpO2 98%   BMI 42.45 kg/m   Physical Exam     No results found for this or any previous visit (from the past 48 hours). No results found.  Assessment/Plan:  I personally spent a total of 30 minutes in the care of the patient today including performing a medically appropriate exam/evaluation, counseling and educating, placing orders, referring and communicating with  other health care professionals, documenting clinical information in the EHR, independently interpreting and reviewing images studies and coordinating care.   Laneta Luna, MD [facility] General Surgeon

## 2024-11-04 NOTE — H&P (View-Only) (Signed)
 Outpatient Surgical Follow Up  11/04/2024  Shane Erickson is an 48 y.o. male.   Chief Complaint  Patient presents with  . Follow-up    HPI: ***  Past Medical History:  Diagnosis Date  . Anal fissure 03/05/2016  . Hypertension    states under control with med., has been on med. x 2 yr.  . Metabolic dysfunction-associated steatohepatitis (MASH)   . Obesity (BMI 30-39.9)   . OSA on CPAP   . Pre-diabetes     Past Surgical History:  Procedure Laterality Date  . ARTHROSCOPY, ANKLE WITH DEBRIDEMENT Right 06/19/2024   Procedure: ARTHROSCOPY, ANKLE WITH DEBRIDEMENT;  Surgeon: Barton Drape, MD;  Location: [facility];  Service: Orthopedics;  Laterality: Right;  right ankle arthroscopy with peroneal tendons debridement with possible repair and/or transfer  . INJECTION, CHEMODENERVATION AGENT N/A 08/13/2024   Procedure: INJECTION, CHEMODENERVATION AGENT;  Surgeon: Jordis Laneta FALCON, MD;  Location: [facility];  Service: General;  Laterality: N/A;  . MENISCUS REPAIR Left 03/22/2022  . RECTAL EXAM UNDER ANESTHESIA N/A 08/13/2024   Procedure: EXAM UNDER ANESTHESIA, RECTUM;  Surgeon: Jordis Laneta FALCON, MD;  Location: [facility];  Service: General;  Laterality: N/A;  . SHOULDER ARTHROSCOPY WITH DISTAL CLAVICLE RESECTION Right 02/09/2024   Procedure: SHOULDER ARTHROSCOPY WITH DISTAL CLAVICLE RESECTION;  Surgeon: Sharl Selinda Dover, MD;  Location: [facility];  Service: Orthopedics;  Laterality: Right;  . SHOULDER ARTHROSCOPY WITH SUBACROMIAL DECOMPRESSION Right 02/09/2024   Procedure: SHOULDER ARTHROSCOPY WITH SUBACROMIAL DECOMPRESSION;  Surgeon: Sharl Selinda Dover, MD;  Location: [facility];  Service: Orthopedics;  Laterality: Right;  . SPHINCTEROTOMY N/A 04/07/2016   Procedure: LATERAL INTERNAL SPHINCTEROTOMY;  Surgeon: Donnice Lunger, MD;  Location: [facility];  Service: General;  Laterality: N/A;  . TENDON TRANSFER Right 06/19/2024   Procedure:  TRANSFER, TENDON;  Surgeon: Barton Drape, MD;  Location: [facility];  Service: Orthopedics;  Laterality: Right;  . UMBILICAL HERNIA REPAIR  12/05/2010    Family History  Problem Relation Age of Onset  . Kidney disease Father   . CVA Father   . Heart attack Father   . Hypertension Father   . CAD Father   . Thyroid disease Mother   . Multiple sclerosis Sister   . CAD Paternal Uncle     Social History:  reports that he has quit smoking. His smoking use included e-cigarettes. He has been exposed to tobacco smoke. He has never used smokeless tobacco. He reports current alcohol use. He reports that he does not use drugs.  Allergies:  Allergies  Allergen Reactions  . Chapstick Rash    BLISTERS  . Sunscreens Rash    BLISTERS  . Porcine (Pork) Protein-Containing Drug Products Other (See Comments)    Preference   . Soap Itching    [place]    Medications reviewed.    ROS Full ROS performed and is otherwise negative other than what is stated in HPI   BP (!) 135/92   Pulse 89   Ht 6' (1.829 m)   Wt (!) 313 lb (142 kg)   SpO2 98%   BMI 42.45 kg/m   Physical Exam     No results found for this or any previous visit (from the past 48 hours). No results found.  Assessment/Plan:  I personally spent a total of 30 minutes in the care of the patient today including performing a medically appropriate exam/evaluation, counseling and educating, placing orders, referring and communicating with  other health care professionals, documenting clinical information in the EHR, independently interpreting and reviewing images studies and coordinating care.   Laneta Luna, MD [facility] General Surgeon

## 2024-11-04 NOTE — Patient Instructions (Signed)
 You have requested to have an exam under anesthesia with Botox  injection. This will be done at [facility] with Dr. Jordis .  If you are on any injectable weight loss medication, you will need to stop taking your GLP-1 injectable (weight loss) medications 8 days before your surgery to avoid any complications with anesthesia.   You will most likely be out of work 3-4 days for this surgery.  If you have FMLA or disability paperwork that needs filled out you may drop this off at our office or this can be faxed to [phone].  Please see the (blue)pre-care form that you have been given today. Our surgery scheduler will call you to verify surgery date and to go over information.   If you have any questions, please call our office.   Botox  is injected close to or into the internal sphincter muscle. The injection works by causing the muscle to become paralysed and thereby reducing the amount of spasm or contraction that it undergoes in conjunction with a fissure. This enforces relaxation and allows more blood to the area to heal the fissure. The Botox  lasts in your system for between 8-12 weeks. This will usually allow the fissure enough time to heal. It is usually well tolerated but does require a general anaesthetic to administer. This will also allow the surgeon to examine you in some detail, which may be too uncomfortable in the clinic. As it is well tolerated, people who fail to heal on the first injection may often be offered repeated injections in an attempt to heal the fissure. If the fissure still continues to be a problem despite repeated Botox  injections, then further procedures may be considered depending on your case. Due to the weakening of the muscle, some people do experience a degree of urgency or incontinence after the injection, although this is temporary as the Botox  does wear off.   Anal Fissure, Adult  An anal fissure is a small tear or crack in the tissue around the  opening of the butt (anus). Bleeding from the tear or crack usually stops on its own within a few minutes. The bleeding may happen every time you poop (have a bowel movement) until the tear or crack heals. What are the causes? This condition is usually caused by passing a large or hard poop (stool). Other causes include: Trouble pooping (constipation). Passing watery poop (diarrhea). Inflammatory bowel disease (Crohn's disease or ulcerative colitis). Childbirth. Infections. Anal sex. What are the signs or symptoms? Symptoms of this condition include: Bleeding from the butt. Small amounts of blood on your poop. The blood coats the outside of the poop. It is not mixed with the poop. Small amounts of blood on the toilet paper or in the toilet after you poop. Pain when passing poop. Itching or irritation around the opening of the butt. How is this diagnosed? This condition may be diagnosed based on a physical exam. Your doctor may: Check your butt. A tear can often be seen by checking the area with care. Check your butt using a short tube (anoscope). The light in the tube will show any problems in your butt. How is this treated? Treatment for this condition may include: Treating problems that make it hard for you to pass poop. You may be told to: Eat more fiber. Drink more fluid. Take fiber supplements. Take medicines that make poop soft. Taking warm water baths (sitz baths). This may help to heal the tear. Using creams and ointments. If your  condition gets worse, other treatments may be needed such as: A shot near the tear or crack (botulinum injection). Surgery to repair the tear or crack. Follow these instructions at home: Eating and drinking  Avoid bananas and dairy products. These foods can make it hard to poop. Drink enough fluid to keep your pee (urine) pale yellow. Eat foods that have a lot of fiber in them, such as: Beans. Whole grains. Fresh fruits. Fresh  vegetables. General instructions  Take over-the-counter and prescription medicines only as told by your doctor. Use creams or ointments only as told by your doctor. Keep the butt area as clean and dry as you can. Take a warm water bath as told by your doctor. Do not use soap. Keep all follow-up visits as told by your doctor. This is important. Contact a doctor if: You have more bleeding. You have a fever. You have watery poop that is mixed with blood. You have pain. Your problem gets worse, not better. Summary An anal fissure is a small tear or crack in the skin around the opening of the butt (anus). This condition is usually caused by passing a large or hard poop (stool). Treatment includes treating the problems that make it hard for you to pass poop. Follow your doctor's instructions about caring for your condition at home. Keep all follow-up visits as told by your doctor. This is important. This information is not intended to replace advice given to you by your health care provider. Make sure you discuss any questions you have with your health care provider. Document Revised: 06/17/2021 Document Reviewed: 06/17/2021 Elsevier Patient Education  2023 [employer].

## 2024-11-04 NOTE — Telephone Encounter (Signed)
 Patient has been advised of Pre-Admission date/time, and Surgery date at [facility].  Surgery Date: 11/19/24 Preadmission Testing Date: 11/08/24 (phone 1p-4p)  Patient informed of the scheduling process and surgery information given at time of office visit.  Patient has been made aware to call [phone], between 1-3:00pm the day before surgery, to find out what time to arrive for surgery.

## 2024-11-06 ENCOUNTER — Ambulatory Visit (HOSPITAL_BASED_OUTPATIENT_CLINIC_OR_DEPARTMENT_OTHER): Payer: Worker's Compensation | Admitting: Physical Therapy

## 2024-11-06 ENCOUNTER — Encounter (HOSPITAL_BASED_OUTPATIENT_CLINIC_OR_DEPARTMENT_OTHER): Payer: Self-pay | Admitting: Physical Therapy

## 2024-11-06 DIAGNOSIS — M25562 Pain in left knee: Secondary | ICD-10-CM | POA: Insufficient documentation

## 2024-11-06 DIAGNOSIS — G8929 Other chronic pain: Secondary | ICD-10-CM | POA: Insufficient documentation

## 2024-11-06 DIAGNOSIS — M5459 Other low back pain: Secondary | ICD-10-CM | POA: Diagnosis present

## 2024-11-06 DIAGNOSIS — M6281 Muscle weakness (generalized): Secondary | ICD-10-CM | POA: Insufficient documentation

## 2024-11-06 DIAGNOSIS — M25561 Pain in right knee: Secondary | ICD-10-CM | POA: Diagnosis present

## 2024-11-06 MED ORDER — ONABOTULINUMTOXINA 100 UNITS IJ SOLR: 100.0000 [IU] | Freq: Once | INTRAMUSCULAR | Status: AC

## 2024-11-06 NOTE — Therapy (Signed)
OUTPATIENT PHYSICAL THERAPY THORACOLUMBAR TREATMENT   Patient Name: Shane Erickson MRN: [id number] DOB:[birth year], 48 y.o., male Today's Date: 11/06/2024  END OF SESSION:  PT End of Session - 11/06/24 1516     Visit Number 3    Number of Visits 12    Date for Recertification  12/06/24    Authorization Type workers comp    PT Start Time 1515    PT Stop Time 1558    PT Time Calculation (min) 43 min    Activity Tolerance Patient tolerated treatment well    Behavior During Therapy WFL for tasks assessed/performed          Past Medical History:  Diagnosis Date   Anal fissure 03/05/2016   Hypertension    states under control with med., has been on med. x 2 yr.   Metabolic dysfunction-associated steatohepatitis (MASH)    Obesity (BMI 30-39.9)    OSA on CPAP    Pre-diabetes    Past Surgical History:  Procedure Laterality Date   ARTHROSCOPY, ANKLE WITH DEBRIDEMENT Right 06/19/2024   Procedure: ARTHROSCOPY, ANKLE WITH DEBRIDEMENT;  Surgeon: Barton Drape, MD;  Location: [facility];  Service: Orthopedics;  Laterality: Right;  right ankle arthroscopy with peroneal tendons debridement with possible repair and/or transfer   INJECTION, CHEMODENERVATION AGENT N/A 08/13/2024   Procedure: INJECTION, CHEMODENERVATION AGENT;  Surgeon: Jordis Laneta FALCON, MD;  Location: [facility];  Service: General;  Laterality: N/A;   MENISCUS REPAIR Left 03/22/2022   RECTAL EXAM UNDER ANESTHESIA N/A 08/13/2024   Procedure: EXAM UNDER ANESTHESIA, RECTUM;  Surgeon: Jordis Laneta FALCON, MD;  Location: [facility];  Service: General;  Laterality: N/A;   SHOULDER ARTHROSCOPY WITH DISTAL CLAVICLE RESECTION Right 02/09/2024   Procedure: SHOULDER ARTHROSCOPY WITH DISTAL CLAVICLE RESECTION;  Surgeon: Sharl Selinda Dover, MD;  Location: [facility];  Service: Orthopedics;  Laterality: Right;   SHOULDER ARTHROSCOPY WITH SUBACROMIAL DECOMPRESSION Right 02/09/2024   Procedure: SHOULDER ARTHROSCOPY WITH  SUBACROMIAL DECOMPRESSION;  Surgeon: Sharl Selinda Dover, MD;  Location: [facility];  Service: Orthopedics;  Laterality: Right;   SPHINCTEROTOMY N/A 04/07/2016   Procedure: LATERAL INTERNAL SPHINCTEROTOMY;  Surgeon: Donnice Lunger, MD;  Location: [facility];  Service: General;  Laterality: N/A;   TENDON TRANSFER Right 06/19/2024   Procedure: TRANSFER, TENDON;  Surgeon: Barton Drape, MD;  Location: [facility];  Service: Orthopedics;  Laterality: Right;   UMBILICAL HERNIA REPAIR  12/05/2010   Patient Active Problem List   Diagnosis Date Noted   Anal fissure 08/13/2024   Metabolic dysfunction-associated steatohepatitis (MASH) 02/27/2024   Prediabetes 02/27/2024   OSA (obstructive sleep apnea) - possibly severe, on CPAP 02/27/2024   Primary hypertension 02/27/2024   Class 3 severe obesity with serious comorbidity and body mass index (BMI) of 40.0 to 44.9 in adult [facility]) 02/27/2024   Decreased GFR 02/27/2024   Hx of rectal sphincterotomy May 2017 04/07/2016   Paresthesia 04/29/2014    PCP: Nell Piety FNP   REFERRING PROVIDER: Donaciano Sprang MD  REFERRING DIAG: M54.2 (ICD-10-CM) - Cervicalgia  M17.0 (ICD-10-CM) - Bilateral primary osteoarthritis of knee   Rationale for Evaluation and Treatment: Rehabilitation  THERAPY DIAG:  Bilateral chronic knee pain  Other low back pain  Muscle weakness (generalized)  ONSET DATE: 08/14/23  SUBJECTIVE:  SUBJECTIVE STATEMENT: Knees 4-5/10; hips 8/10.  Felt better for a few hours after last/first session but then it just came right back  POOL ACCESS: [employer] member  Initial subjective statement:  C5-7 messed up.  Should be orders for my neck, back and knees.  Need to have R THR after I lose 6 lbs, both hips  need to be replaced. Going to pain management for my back.  Don't want to have any more surgery. Had ankle surgery 3 months ago. Doing land based PT at [facility] 2 x week (foe ankle, back and hips). I have access to a pool near my home  PERTINENT HISTORY:  Fall 9/9 fell into hole at work. Had multiple injuries including right wrist fx. Hurt back both shoulders, both knees, hips and back. Had spinal injection in March did not relieve pain. May 22 another spine injection s.. June 2nd arthroscopic left knee surgery for meniscus repair.   02/09/2024 Surgery SHOULDER ARTHROSCOPY WITH SUBACROMIAL DECOMPRESSION   PAIN:  Are you having pain? Yes: NPRS scale: 6/10 Pain location: hip/back neck/ generalized Pain description: ache, tingling into Lt hand and RLE to foot Aggravating factors: movement Relieving factors: unknown  PRECAUTIONS: None  RED FLAGS: None   WEIGHT BEARING RESTRICTIONS: No  FALLS:  Has patient fallen in last 6 months? No  LIVING ENVIRONMENT: Lives with: lives with their family Lives in: House/apartment Stairs: Yes: External: 16 steps; on right going up Has following equipment at home: None  OCCUPATION: out from work due to injury  PLOF: Independent  PATIENT GOALS: better movement, strengthening core  NEXT MD VISIT: as needed  OBJECTIVE:  Note: Objective measures were completed at Evaluation unless otherwise noted.  DIAGNOSTIC FINDINGS:  The MRI did show a central disc protrusion with left greater than right S1 nerve irritation.  Bone spur L5  PATIENT SURVEYS:  LEFS:18/80  COGNITION: Overall cognitive status: Within functional limits for tasks assessed     SENSATION: Numbness/tingling ulnar nerve distribution left Reports tingling bilat LE  POSTURE: rounded shoulders and forward head  PALPATION: Mild TTP joint lines bil knees   CERVICAL ROM:   Active ROM A/PROM (deg) eval  Flexion Limited 50%P!  Extension Limited 50%  Right lateral flexion  Limited 50%P!  Left lateral flexion Limited 50% P!  Right rotation full  Left rotation Limited 25%   (Blank rows = not tested)  LUMBAR ROM:   AROM eval  Flexion FT to mid shin  Extension Full P!  Right lateral flexion Limited 25%P!  Left lateral flexion Limited 25%P!  Right rotation   Left rotation    (Blank rows = not tested)  LOWER EXTREMITY ROM:     Active  Right eval Left eval  Hip flexion    Hip extension    Hip abduction    Hip adduction    Hip internal rotation    Hip external rotation    Knee flexion 115 122  Knee extension    Ankle dorsiflexion    Ankle plantarflexion    Ankle inversion    Ankle eversion     (Blank rows = not tested)  LOWER EXTREMITY MMT:    MMT Right eval Left eval  Hip flexion 25.4 37.3  Hip extension    Hip abduction 17.5 33.5  Hip adduction    Hip internal rotation    Hip external rotation    Knee flexion    Knee extension 32.6 24.0  Ankle dorsiflexion    Ankle plantarflexion  Ankle inversion    Ankle eversion     (Blank rows = not tested)  LUMBAR SPECIAL TESTS:  Slump test: Negative  FUNCTIONAL TESTS:  Timed up and go (TUG): 17.52   4 stage balance: passed GAIT: Distance walked: 500 ft Assistive device utilized: None Level of assistance: Complete Independence Comments: bilat knee varus  TREATMENT  [facility] Adult PT Treatment:                                             Date: 11/06/24 Pt seen for aquatic therapy today.  Treatment took place in water 3.5-4.75 ft in depth at the [facility] pool. Temp of water was 91.  Pt entered/exited the pool via stairs independently with bil rail.  - unsupported walking forward/ backward with arm swing, multiple laps  -Forward march with kick - unsupported side stepping -> with arm add/abdct using yellow hand floats ->side lunge - UE on yellow HB: alternating hamstring curls x 10; toe/heel raises x20; hip add/abdct x10 ; hip flexion /extension x10;  -Solid black noodle  stomp R/L - R/L hamstring stretch at stairs, 3 x each LE, heel on 2nd step with ankle in DF, 15sec  - quad stretch at wall using black noodle  -walking forward and back between exercises for recovery   Pt requires the buoyancy and hydrostatic pressure of water for support, and to offload joints by unweighting joint load by at least 50 % in navel deep water and by at least 75-80% in chest to neck deep water.  Viscosity of the water is needed for resistance of strengthening. Water current perturbations provides challenge to standing balance requiring increased core activation.                                                                                                                                PATIENT EDUCATION:  Education details: reacquainting to aquatic therapy  Person educated: Patient Education method: Explanation Education comprehension: verbalized understanding  HOME EXERCISE PROGRAM: Pt indep with land based PT as per [facility]  Aquatic assigned last episode  but not issued/instructed Aquatic Access Code: [email or web address] URL: [email or web address] Date: 06/12/2024 Prepared by: Frankie Cleatus Gabriel  ASSESSMENT:   CLINICAL IMPRESSION: Good response from initial aquatic session with reduction in pain for a few hours. Discussed pool access for future. Progressed le strengthening and stretching.  Able to gain good quad stretch using noodle without end range knee flex which causes discomfort in knee joint.  He does have some right gastoc cramping end of sesison.  Instructed to increase fluids as able.  Good session.  Goals ongoing     From initial evaluation:  Patient is a 48 y.o. m who was seen today for physical therapy evaluation and treatment for cervicalgia and bilat knee OA.  Pt is under workers comp and  reports [facility] working land based on all areas including cervical spine.  He prefers to focus and knee and back pain in aquatics working cervical spine at  [employer]. He is well known to this clinic being seen for 6 weeks not returning in early July due to having an additional surgical procedure on ankle.  Initial injury in Sept 2024 from fall at work with surgical interventions repairing left shoulder, right ankle and bilateral knees. He is pending surgical intervention for hips knees.  He reports continued back (cervical through Lumbar) pain from fall.  He VU of the purpose and benefits of using the properties of water to allow for movement, some strengthening and pain management.  Testing identifies weakness in his hips and core but balance looks to be intact.  Her has had no falls.  He is a good candidate for aquatic intervention for instruction and improvement on toleration to end range movement and usage of the properties of water for pain management.  He understands he will need pool access going forward to benefit from long term pain management using water for his chronic conditions.    Of Note: his strength as compared to last episode in hips have reduced but in knees have improved.  OBJECTIVE IMPAIRMENTS: Abnormal gait, decreased activity tolerance, decreased balance, decreased endurance, decreased mobility, difficulty walking, decreased strength, and pain.    ACTIVITY LIMITATIONS: carrying, lifting, bending, sitting, standing, squatting, sleeping, stairs, transfers, and locomotion level   PARTICIPATION LIMITATIONS: meal prep, cleaning, laundry, shopping, community activity, occupation, and yard work   PERSONAL FACTORS: Multiple orthopedic issues being delt with at same time are also affecting patient's functional outcome.    REHAB POTENTIAL: Good   CLINICAL DECISION MAKING: Evolving/moderate complexity   EVALUATION COMPLEXITY: Moderate   GOALS: Goals reviewed with patient? Yes  SHORT TERM GOALS: Target date: 11/05/24  Pt will tolerate full aquatic sessions consistently without increase in pain and with improving function to  demonstrate good toleration and effectiveness of intervention.  Baseline: Goal status: Met 11/06/24  2.  Pt will consider gaining pool access for use of the properties of water for chronic conditions maintaining mobility and minimizing pain. Baseline:  Goal status: In progress 11/06/24    LONG TERM GOALS: Target date: 12/07/23  Pt to improve on LEFS by at least 9 point to demonstrate statistically significant Improvement in function. Baseline: 18/80 Goal status: INITIAL  2.  Pt will be indep with final aquatic HEP for continued management of condition Baseline:  Goal status: INITIAL  3.  Pt will report decrease in pain by at least 50% (all areas) for improved toleration to activity/quality of life and to demonstrate improved management of pain.  Baseline:  Goal status: INITIAL  4.  Pt will improve strength in all areas listed by at least 5 lbs  to demonstrate improved overall physical function Baseline:  Goal status: INITIAL   PLAN:  PT FREQUENCY: 2x/week  PT DURATION: 6 weeks  PLANNED INTERVENTIONS: 97164- PT Re-evaluation, 97750- Physical Performance Testing, 97110-Therapeutic exercises, 97530- Therapeutic activity, 97112- Neuromuscular re-education, 97535- Self Care, 02859- Manual therapy, [id number]- Gait training, [phone]- Aquatic Therapy, [phone] (1-2 muscles), 20561 (3+ muscles)- Dry Needling, Patient/Family education, Balance training, Stair training, Taping, Joint mobilization, DME instructions, Cryotherapy, and Moist heat.  PLAN FOR NEXT SESSION: aquatic only: le/core strengthening/stretching; pain management; HEP  Ronal Foots) Xenia Nile MPT 11/06/24 4:06 PM [facility] [address] [place], [place], [address] Phone: [phone]   Fax:  [phone]   

## 2024-11-08 ENCOUNTER — Other Ambulatory Visit: Payer: Self-pay

## 2024-11-08 ENCOUNTER — Inpatient Hospital Stay: Admission: RE | Admit: 2024-11-08 | Discharge: 2024-11-08 | Attending: Surgery

## 2024-11-08 DIAGNOSIS — I10 Essential (primary) hypertension: Secondary | ICD-10-CM

## 2024-11-08 DIAGNOSIS — Z79899 Other long term (current) drug therapy: Secondary | ICD-10-CM

## 2024-11-08 DIAGNOSIS — Z01812 Encounter for preprocedural laboratory examination: Secondary | ICD-10-CM

## 2024-11-08 DIAGNOSIS — Z01810 Encounter for preprocedural cardiovascular examination: Secondary | ICD-10-CM

## 2024-11-08 DIAGNOSIS — K602 Anal fissure, unspecified: Secondary | ICD-10-CM

## 2024-11-08 HISTORY — DX: Depression, unspecified: F32.A

## 2024-11-08 HISTORY — DX: Anxiety disorder, unspecified: F41.9

## 2024-11-08 HISTORY — DX: Diverticulosis of large intestine without perforation or abscess without bleeding: K57.30

## 2024-11-08 HISTORY — DX: Unspecified osteoarthritis, unspecified site: M19.90

## 2024-11-08 NOTE — Patient Instructions (Addendum)
 Your procedure is scheduled on: 11/19/24 - Tuesday Report to the Registration Desk on the 1st floor of the [facility]. To find out your arrival time, please call [phone] between 1PM - 3PM on: 11/18/24 - Monday If your arrival time is 6:00 am, do not arrive before that time as the [facility] entrance doors do not open until 6:00 am. [facility] [address] [facility] , [address], for labs/EKG on 11/18/24 at 9:30 am  REMEMBER: Instructions that are not followed completely may result in serious medical risk, up to and including death; or upon the discretion of your surgeon and anesthesiologist your surgery may need to be rescheduled.  Do not eat food after midnight the night before surgery.  No gum chewing or hard candies.  You may however, drink CLEAR liquids up to 2 hours before you are scheduled to arrive for your surgery. Do not drink anything within 2 hours of your scheduled arrival time.  Clear liquids include: - water  - apple juice without pulp - gatorade (not RED colors) - black coffee or tea (Do NOT add milk or creamers to the coffee or tea) Do NOT drink anything that is not on this list.  One week prior to surgery: Stop Anti-inflammatories (NSAIDS) such as Advil, Aleve , Ibuprofen, Motrin, Naproxen , Naprosyn  , VOLTAREN , and Aspirin based products such as Excedrin, Goody's Powder, BC Powder. You may take Tylenol  if needed for pain up until the day of surgery.  Stop ANY OVER THE COUNTER supplements until after surgery.  HOLD olmesartan-hydrochlorothiazide on the morning of surgery.  OZEMPIC hold 7 days prior to surgery.  meloxicam (MOBIC) hold 7 days prior to surgery.  ON THE DAY OF SURGERY ONLY TAKE THESE MEDICATIONS WITH SIPS OF WATER:  oxyCODONE -acetaminophen  if needed citalopram (CELEXA)    No Alcohol for 24 hours before or after surgery.  No Smoking including e-cigarettes for 24 hours before surgery.  No chewable  tobacco products for at least 6 hours before surgery.  No nicotine patches on the day of surgery.  Do not use any recreational drugs for at least a week (preferably 2 weeks) before your surgery.  Please be advised that the combination of cocaine and anesthesia may have negative outcomes, up to and including death. If you test positive for cocaine, your surgery will be cancelled.  On the morning of surgery brush your teeth with toothpaste and water, you may rinse your mouth with mouthwash if you wish. Do not swallow any toothpaste or mouthwash.  Do not wear jewelry, make-up, hairpins, clips or nail polish.  For welded (permanent) jewelry: bracelets, anklets, waist bands, etc.  Please have this removed prior to surgery.  If it is not removed, there is a chance that hospital personnel will need to cut it off on the day of surgery.  Do not wear lotions, powders, or perfumes.   Do not shave body hair from the neck down 48 hours before surgery.  Contact lenses, hearing aids and dentures may not be worn into surgery.  Do not bring valuables to the hospital. [facility] is not responsible for any missing/lost belongings or valuables.   Bring your C-PAP to the hospital in case you may have to spend the night.   Notify your doctor if there is any change in your medical condition (cold, fever, infection).  Wear comfortable clothing (specific to your surgery type) to the hospital.  After surgery, you can help prevent lung  complications by doing breathing exercises.  Take deep breaths and cough every 1-2 hours. Your doctor may order a device called an Incentive Spirometer to help you take deep breaths.  If you are being admitted to the hospital overnight, leave your suitcase in the car. After surgery it may be brought to your room.  In case of increased patient census, it may be necessary for you, the patient, to continue your postoperative care in the Same Day Surgery department.  If you are  being discharged the day of surgery, you will not be allowed to drive home. You will need a responsible individual to drive you home and stay with you for 24 hours after surgery.   If you are taking public transportation, you will need to have a responsible individual with you.  Please call the Pre-admissions Testing Dept. at [phone] if you have any questions about these instructions.  Surgery Visitation Policy:  Patients having surgery or a procedure may have two visitors.  Children under the age of 21 must have an adult with them who is not the patient.  Inpatient Visitation:    Visiting hours are 7 a.m. to 8 p.m. Up to four visitors are allowed at one time in a patient room. The visitors may rotate out with other people during the day.  One visitor age 73 or older may stay with the patient overnight and must be in the room by 8 p.m.   Merchandiser, Retail to address health-related social needs:  [email or web address]

## 2024-11-11 LAB — COLOGUARD: COLOGUARD: NEGATIVE

## 2024-11-14 ENCOUNTER — Ambulatory Visit (HOSPITAL_BASED_OUTPATIENT_CLINIC_OR_DEPARTMENT_OTHER): Payer: Self-pay | Admitting: Physical Therapy

## 2024-11-18 ENCOUNTER — Inpatient Hospital Stay: Admission: RE | Admit: 2024-11-18 | Discharge: 2024-11-18 | Attending: Surgery

## 2024-11-18 ENCOUNTER — Ambulatory Visit (HOSPITAL_BASED_OUTPATIENT_CLINIC_OR_DEPARTMENT_OTHER): Payer: Worker's Compensation | Admitting: Physical Therapy

## 2024-11-18 DIAGNOSIS — Z01812 Encounter for preprocedural laboratory examination: Secondary | ICD-10-CM

## 2024-11-18 DIAGNOSIS — I10 Essential (primary) hypertension: Secondary | ICD-10-CM | POA: Diagnosis not present

## 2024-11-18 DIAGNOSIS — Z01810 Encounter for preprocedural cardiovascular examination: Secondary | ICD-10-CM

## 2024-11-18 DIAGNOSIS — K601 Chronic anal fissure: Secondary | ICD-10-CM | POA: Diagnosis not present

## 2024-11-18 DIAGNOSIS — F32A Depression, unspecified: Secondary | ICD-10-CM | POA: Diagnosis not present

## 2024-11-18 DIAGNOSIS — Z01818 Encounter for other preprocedural examination: Secondary | ICD-10-CM | POA: Insufficient documentation

## 2024-11-18 DIAGNOSIS — G4733 Obstructive sleep apnea (adult) (pediatric): Secondary | ICD-10-CM | POA: Diagnosis not present

## 2024-11-18 DIAGNOSIS — K602 Anal fissure, unspecified: Secondary | ICD-10-CM | POA: Diagnosis present

## 2024-11-18 DIAGNOSIS — Z79899 Other long term (current) drug therapy: Secondary | ICD-10-CM | POA: Insufficient documentation

## 2024-11-18 DIAGNOSIS — K6289 Other specified diseases of anus and rectum: Secondary | ICD-10-CM | POA: Diagnosis not present

## 2024-11-18 DIAGNOSIS — F419 Anxiety disorder, unspecified: Secondary | ICD-10-CM | POA: Diagnosis not present

## 2024-11-18 LAB — CBC
HCT: 41.5 % (ref 39.0–52.0)
Hemoglobin: 13.5 g/dL (ref 13.0–17.0)
MCH: 27.8 pg (ref 26.0–34.0)
MCHC: 32.5 g/dL (ref 30.0–36.0)
MCV: 85.6 fL (ref 80.0–100.0)
Platelets: 340 K/uL (ref 150–400)
RBC: 4.85 MIL/uL (ref 4.22–5.81)
RDW: 14.6 % (ref 11.5–15.5)
WBC: 5 K/uL (ref 4.0–10.5)
nRBC: 0 % (ref 0.0–0.2)

## 2024-11-18 LAB — BASIC METABOLIC PANEL WITH GFR
Anion gap: 11 (ref 5–15)
BUN: 20 mg/dL (ref 6–20)
CO2: 30 mmol/L (ref 22–32)
Calcium: 9.5 mg/dL (ref 8.9–10.3)
Chloride: 101 mmol/L (ref 98–111)
Creatinine, Ser: 1.34 mg/dL — ABNORMAL HIGH (ref 0.61–1.24)
GFR, Estimated: >60 mL/min (ref >60)
Glucose, Bld: 120 mg/dL — ABNORMAL HIGH (ref 70–99)
Potassium: 3.6 mmol/L (ref 3.5–5.1)
Sodium: 142 mmol/L (ref 135–145)

## 2024-11-19 ENCOUNTER — Encounter: Admission: RE | Disposition: A | Payer: Self-pay | Attending: Surgery

## 2024-11-19 ENCOUNTER — Ambulatory Visit: Admission: RE | Admit: 2024-11-19 | Discharge: 2024-11-19 | Disposition: A | Attending: Surgery | Admitting: Surgery

## 2024-11-19 ENCOUNTER — Other Ambulatory Visit: Payer: Self-pay

## 2024-11-19 ENCOUNTER — Encounter: Payer: Self-pay | Admitting: Surgery

## 2024-11-19 ENCOUNTER — Encounter: Payer: Self-pay | Admitting: Urgent Care

## 2024-11-19 ENCOUNTER — Ambulatory Visit

## 2024-11-19 DIAGNOSIS — F419 Anxiety disorder, unspecified: Secondary | ICD-10-CM | POA: Insufficient documentation

## 2024-11-19 DIAGNOSIS — K601 Chronic anal fissure: Secondary | ICD-10-CM | POA: Diagnosis not present

## 2024-11-19 DIAGNOSIS — F32A Depression, unspecified: Secondary | ICD-10-CM | POA: Insufficient documentation

## 2024-11-19 DIAGNOSIS — K602 Anal fissure, unspecified: Secondary | ICD-10-CM | POA: Diagnosis not present

## 2024-11-19 DIAGNOSIS — I10 Essential (primary) hypertension: Secondary | ICD-10-CM | POA: Insufficient documentation

## 2024-11-19 DIAGNOSIS — G4733 Obstructive sleep apnea (adult) (pediatric): Secondary | ICD-10-CM | POA: Insufficient documentation

## 2024-11-19 DIAGNOSIS — K6289 Other specified diseases of anus and rectum: Secondary | ICD-10-CM | POA: Insufficient documentation

## 2024-11-19 DIAGNOSIS — Z79899 Other long term (current) drug therapy: Secondary | ICD-10-CM | POA: Insufficient documentation

## 2024-11-19 HISTORY — PX: INJECTION, CHEMODENERVATION AGENT: SHX7597

## 2024-11-19 HISTORY — PX: RECTAL EXAM UNDER ANESTHESIA: SHX6399

## 2024-11-19 SURGERY — EXAM UNDER ANESTHESIA, RECTUM
Anesthesia: General

## 2024-11-19 MED ORDER — CHLORHEXIDINE GLUCONATE 0.12 % MT SOLN
OROMUCOSAL | Status: AC
  Filled 2024-11-19: qty 15

## 2024-11-19 MED ORDER — CHLORHEXIDINE GLUCONATE CLOTH 2 % EX PADS: 6.0000 | MEDICATED_PAD | Freq: Once | CUTANEOUS | Status: DC

## 2024-11-19 MED ORDER — FENTANYL CITRATE (PF) 100 MCG/2ML IJ SOLN
INTRAMUSCULAR | Status: DC | PRN
  Administered 2024-11-19: 13:00:00 50 ug via INTRAVENOUS

## 2024-11-19 MED ORDER — LIDOCAINE HCL (PF) 2 % IJ SOLN
INTRAMUSCULAR | Status: AC
  Filled 2024-11-19: qty 5

## 2024-11-19 MED ORDER — SODIUM CHLORIDE (PF) 0.9 % IJ SOLN
INTRAMUSCULAR | Status: DC | PRN
  Administered 2024-11-19: 13:00:00 10 mL via INTRAVENOUS

## 2024-11-19 MED ORDER — OXYCODONE HCL 5 MG PO TABS: 5.0000 mg | ORAL_TABLET | Freq: Once | ORAL | Status: DC | PRN

## 2024-11-19 MED ORDER — SODIUM CHLORIDE (PF) 0.9 % IJ SOLN
INTRAMUSCULAR | Status: AC
  Filled 2024-11-19: qty 10

## 2024-11-19 MED ORDER — FENTANYL CITRATE (PF) 100 MCG/2ML IJ SOLN
INTRAMUSCULAR | Status: AC
  Filled 2024-11-19: qty 2

## 2024-11-19 MED ORDER — GABAPENTIN 300 MG PO CAPS
300.0000 mg | ORAL_CAPSULE | ORAL | Status: AC
  Administered 2024-11-19: 12:00:00 300 mg via ORAL

## 2024-11-19 MED ORDER — PROPOFOL 1000 MG/100ML IV EMUL
INTRAVENOUS | Status: AC
  Filled 2024-11-19: qty 100

## 2024-11-19 MED ORDER — BUPIVACAINE LIPOSOME 1.3 % IJ SUSP
INTRAMUSCULAR | Status: DC | PRN
  Administered 2024-11-19: 13:00:00 20 mL

## 2024-11-19 MED ORDER — GABAPENTIN 300 MG PO CAPS
ORAL_CAPSULE | ORAL | Status: AC
  Filled 2024-11-19: qty 1

## 2024-11-19 MED ORDER — ORAL CARE MOUTH RINSE: 15.0000 mL | Freq: Once | OROMUCOSAL | Status: AC

## 2024-11-19 MED ORDER — CHLORHEXIDINE GLUCONATE 0.12 % MT SOLN
15.0000 mL | Freq: Once | OROMUCOSAL | Status: AC
  Administered 2024-11-19: 12:00:00 15 mL via OROMUCOSAL

## 2024-11-19 MED ORDER — MIDAZOLAM HCL (PF) 2 MG/2ML IJ SOLN
INTRAMUSCULAR | Status: DC | PRN
  Administered 2024-11-19: 13:00:00 2 mg via INTRAVENOUS

## 2024-11-19 MED ORDER — SODIUM CHLORIDE 0.9 % IV SOLN
2.0000 g | INTRAVENOUS | Status: AC
  Administered 2024-11-19: 13:00:00 2 g via INTRAVENOUS

## 2024-11-19 MED ORDER — DROPERIDOL 2.5 MG/ML IJ SOLN: 0.6250 mg | Freq: Once | INTRAMUSCULAR | Status: DC | PRN

## 2024-11-19 MED ORDER — ACETAMINOPHEN 500 MG PO TABS
ORAL_TABLET | ORAL | Status: AC
  Filled 2024-11-19: qty 2

## 2024-11-19 MED ORDER — ONABOTULINUMTOXINA 100 UNITS IJ SOLR
100.0000 [IU] | Freq: Once | INTRAMUSCULAR | Status: AC
  Administered 2024-11-19: 13:00:00 100 [IU] via INTRAMUSCULAR
  Filled 2024-11-19: qty 100

## 2024-11-19 MED ORDER — PROPOFOL 10 MG/ML IV BOLUS
INTRAVENOUS | Status: DC | PRN
  Administered 2024-11-19: 13:00:00 120 ug/kg/min via INTRAVENOUS

## 2024-11-19 MED ORDER — LIDOCAINE HCL (CARDIAC) PF 100 MG/5ML IV SOSY
PREFILLED_SYRINGE | INTRAVENOUS | Status: DC | PRN
  Administered 2024-11-19: 13:00:00 50 mg via INTRAVENOUS

## 2024-11-19 MED ORDER — 0.9 % SODIUM CHLORIDE (POUR BTL) OPTIME
TOPICAL | Status: DC | PRN
  Administered 2024-11-19: 13:00:00 500 mL

## 2024-11-19 MED ORDER — ACETAMINOPHEN 500 MG PO TABS
1000.0000 mg | ORAL_TABLET | ORAL | Status: AC
  Administered 2024-11-19: 12:00:00 1000 mg via ORAL

## 2024-11-19 MED ORDER — OXYCODONE HCL 5 MG/5ML PO SOLN: 5.0000 mg | Freq: Once | ORAL | Status: DC | PRN

## 2024-11-19 MED ORDER — LACTATED RINGERS IV SOLN: INTRAVENOUS | Status: DC

## 2024-11-19 MED ORDER — OXYCODONE HCL 5 MG PO TABS: 5.0000 mg | ORAL_TABLET | ORAL | 0 refills | Status: AC | PRN

## 2024-11-19 MED ORDER — SODIUM CHLORIDE 0.9 % IV SOLN
INTRAVENOUS | Status: AC
  Filled 2024-11-19: qty 2

## 2024-11-19 MED ORDER — ACETAMINOPHEN 10 MG/ML IV SOLN: 1000.0000 mg | Freq: Once | INTRAVENOUS | Status: DC | PRN

## 2024-11-19 MED ORDER — FENTANYL CITRATE (PF) 100 MCG/2ML IJ SOLN: 25.0000 ug | INTRAMUSCULAR | Status: DC | PRN

## 2024-11-19 MED ORDER — BUPIVACAINE LIPOSOME 1.3 % IJ SUSP
INTRAMUSCULAR | Status: AC
  Filled 2024-11-19: qty 20

## 2024-11-19 MED ORDER — MIDAZOLAM HCL 2 MG/2ML IJ SOLN
INTRAMUSCULAR | Status: AC
  Filled 2024-11-19: qty 2

## 2024-11-19 SURGICAL SUPPLY — 21 items
BLADE SURG 15 STRL LF DISP TIS (BLADE) ×1 IMPLANT
BRIEF MESH DISP 2XL (UNDERPADS AND DIAPERS) ×1 IMPLANT
ELECT CAUTERY BLADE 6.4 (BLADE) ×1 IMPLANT
ELECTRODE REM PT RTRN 9FT ADLT (ELECTROSURGICAL) ×1 IMPLANT
GAUZE 4X4 16PLY ~~LOC~~+RFID DBL (SPONGE) ×1 IMPLANT
GLOVE BIO SURGEON STRL SZ7 (GLOVE) ×1 IMPLANT
GOWN STRL REUS W/ TWL LRG LVL3 (GOWN DISPOSABLE) ×2 IMPLANT
KIT TURNOVER CYSTO (KITS) ×1 IMPLANT
MANIFOLD NEPTUNE II (INSTRUMENTS) ×1 IMPLANT
NDL HYPO 22X1.5 SAFETY MO (MISCELLANEOUS) ×3 IMPLANT
NEEDLE HYPO 22X1.5 SAFETY MO (MISCELLANEOUS) ×3 IMPLANT
PACK BASIN MINOR ARMC (MISCELLANEOUS) ×1 IMPLANT
PAD OB MATERNITY 11 LF (PERSONAL CARE ITEMS) ×1 IMPLANT
PAD PREP OB/GYN DISP 24X41 (PERSONAL CARE ITEMS) ×1 IMPLANT
SOLN STERILE WATER 500 ML (IV SOLUTION) ×1 IMPLANT
SOLUTION PREP PVP 2OZ (MISCELLANEOUS) ×1 IMPLANT
SPONGE T-LAP 18X18 ~~LOC~~+RFID (SPONGE) ×1 IMPLANT
SURGILUBE 2OZ TUBE FLIPTOP (MISCELLANEOUS) ×1 IMPLANT
SYR 10ML LL (SYRINGE) ×1 IMPLANT
SYR 20ML LL LF (SYRINGE) ×1 IMPLANT
TRAP FLUID SMOKE EVACUATOR (MISCELLANEOUS) ×1 IMPLANT

## 2024-11-19 NOTE — Discharge Instructions (Addendum)
 Lateral Internal Sphincterotomy, Care After After a lateral internal sphincterotomy, it is common to have: Pain in your rectum. You may have pain when you poop. Some tenderness and swelling. A small amount of fluid or blood coming from your rectum. Follow these instructions at home: Medicines Take over-the-counter and prescription medicines only as told by your health care provider. These include medicines that help you poop (laxatives) and soften your poop (stool softeners). If you were prescribed antibiotics, take them as told by your provider. Do not stop using the antibiotic even if you start to feel better. Ask your provider if the medicine prescribed to you: Requires you to avoid driving or using machinery. Can cause constipation. You may need to take these actions to prevent or treat constipation: Drink enough fluid to keep your pee (urine) pale yellow. Take over-the-counter or prescription medicines. Eat foods that are high in fiber, such as beans, whole grains, and fresh fruits and vegetables. Limit foods that are high in fat and processed sugars, such as fried or sweet foods. Incision care  Follow instructions from your provider about how to take care of your incision. Make sure you: Take sitz baths as told by your provider. A sitz bath is a warm water bath that you take while sitting down. The water should come up to your hips and cover your butt. You may be told to take several sitz baths a day. Wear a pad in your underwear to help absorb drainage. Change this pad as needed. Check the area around your rectum every day for signs of infection. Check for: Redness. More swelling or pain. Pus. Activity If you were given a sedative during the procedure, it can affect you for several hours. Do not drive or operate machinery until your provider says that it is safe. You may have to avoid lifting. Ask your provider how much you can safely lift. Return to your normal activities as told  by your provider. Ask your provider what activities are safe for you. General instructions Do not use any products that contain nicotine or tobacco. These products include cigarettes, chewing tobacco, and vaping devices, such as e-cigarettes. If you need help quitting, ask your provider. Do not strain to poop. Do not take baths, swim, or use a hot tub until your provider approves. Ask your provider if you may take showers. You may only be allowed to take sponge baths. Your provider may give you more instructions. Make sure you know what you can and cannot do. Contact a health care provider if: You have a fever. Your pain does not get better with medicine. You get constipated. You have any signs of infection. Get help right away if: You have severe pain. You have bleeding from your rectum that does not stop. You cannot pee (urinate). This information is not intended to replace advice given to you by your health care provider. Make sure you discuss any questions you have with your health care provider. Document Revised: 12/12/2022 Document Reviewed: 10/26/2022 Elsevier Patient Education  2024 [employer].

## 2024-11-19 NOTE — Anesthesia Postprocedure Evaluation (Signed)
 Anesthesia Post Note  Patient: Shane Erickson  Procedure(s) Performed: EXAM UNDER ANESTHESIA, RECTUM INJECTION, CHEMODENERVATION AGENT  Patient location during evaluation: PACU Anesthesia Type: General Level of consciousness: awake and alert Pain management: pain level controlled Vital Signs Assessment: post-procedure vital signs reviewed and stable Respiratory status: spontaneous breathing, nonlabored ventilation, respiratory function stable and patient connected to nasal cannula oxygen Cardiovascular status: blood pressure returned to baseline and stable Postop Assessment: no apparent nausea or vomiting Anesthetic complications: no   No notable events documented.   Last Vitals:  Vitals:   11/19/24 1416 11/19/24 1437  BP: 113/79 (!) 142/93  Pulse: 71 77  Resp: 13 14  Temp: 36.7 C 36.7 C  SpO2: 99% 100%    Last Pain:  Vitals:   11/19/24 1437  TempSrc: Temporal  PainSc: 0-No pain                 Lynwood KANDICE Clause

## 2024-11-19 NOTE — Interval H&P Note (Signed)
 History and Physical Interval Note:  11/19/2024 12:22 PM  Shane Erickson  has presented today for surgery, with the diagnosis of anal fissure.  The various methods of treatment have been discussed with the patient and family. After consideration of risks, benefits and other options for treatment, the patient has consented to  Procedures: EXAM UNDER ANESTHESIA, RECTUM (N/A) INJECTION, CHEMODENERVATION AGENT (N/A) as a surgical intervention.  The patient's history has been reviewed, patient examined, no change in status, stable for surgery.  I have reviewed the patient's chart and labs.  Questions were answered to the patient's satisfaction.     [name] F Avi Archuleta

## 2024-11-19 NOTE — Anesthesia Preprocedure Evaluation (Signed)
 Anesthesia Evaluation  Patient identified by MRN, date of birth, ID band Patient awake    Reviewed: Allergy & Precautions, H&P , NPO status , Patient's Chart, lab work & pertinent test results, reviewed documented beta blocker date and time   Airway Mallampati: II   Neck ROM: full    Dental  (+) Poor Dentition   Pulmonary sleep apnea and Continuous Positive Airway Pressure Ventilation , former smoker   Pulmonary exam normal        Cardiovascular Exercise Tolerance: Good hypertension, On Medications negative cardio ROS Normal cardiovascular exam Rhythm:regular Rate:Normal     Neuro/Psych   Anxiety Depression    negative neurological ROS  negative psych ROS   GI/Hepatic negative GI ROS,,,(+) Hepatitis -  Endo/Other    Class 3 obesity  Renal/GU negative Renal ROS  negative genitourinary   Musculoskeletal   Abdominal   Peds  Hematology negative hematology ROS (+)   Anesthesia Other Findings Past Medical History: 03/05/2016: Anal fissure No date: Anxiety No date: Arthritis No date: Depression No date: Hypertension     Comment:  states under control with med., has been on med. x 2 yr. No date: Metabolic dysfunction-associated steatohepatitis (MASH) No date: Obesity (BMI 30-39.9) No date: OSA on CPAP No date: Pre-diabetes No date: Sigmoid diverticulosis Past Surgical History: 06/19/2024: ARTHROSCOPY, ANKLE WITH DEBRIDEMENT; Right     Comment:  Procedure: ARTHROSCOPY, ANKLE WITH DEBRIDEMENT;                Surgeon: Barton Drape, MD;  Location: [facility];  Service: Orthopedics;  Laterality:               Right;  right ankle arthroscopy with peroneal tendons               debridement with possible repair and/or transfer 08/13/2024: INJECTION, CHEMODENERVATION AGENT; N/A     Comment:  Procedure: INJECTION, CHEMODENERVATION AGENT;  Surgeon:               Jordis Laneta FALCON, MD;   Location: [facility];  Service:               General;  Laterality: N/A; 03/22/2022: MENISCUS REPAIR; Left 08/13/2024: RECTAL EXAM UNDER ANESTHESIA; N/A     Comment:  Procedure: EXAM UNDER ANESTHESIA, RECTUM;  Surgeon:               Jordis Laneta FALCON, MD;  Location: [facility];  Service:               General;  Laterality: N/A; 02/09/2024: SHOULDER ARTHROSCOPY WITH DISTAL CLAVICLE RESECTION; Right     Comment:  Procedure: SHOULDER ARTHROSCOPY WITH DISTAL CLAVICLE               RESECTION;  Surgeon: Sharl Selinda Dover, MD;                Location: [facility];  Service:               Orthopedics;  Laterality: Right; 02/09/2024: SHOULDER ARTHROSCOPY WITH SUBACROMIAL DECOMPRESSION; Right     Comment:  Procedure: SHOULDER ARTHROSCOPY WITH SUBACROMIAL               DECOMPRESSION;  Surgeon: Sharl Selinda Dover, MD;                Location: [facility];  Service:  Orthopedics;  Laterality: Right; 04/07/2016: SPHINCTEROTOMY; N/A     Comment:  Procedure: LATERAL INTERNAL SPHINCTEROTOMY;  Surgeon:               Donnice Lunger, MD;  Location: [facility];              Service: General;  Laterality: N/A; 06/19/2024: TENDON TRANSFER; Right     Comment:  Procedure: TRANSFER, TENDON;  Surgeon: Barton Drape, MD;  Location: [facility];                Service: Orthopedics;  Laterality: Right; 12/05/2010: UMBILICAL HERNIA REPAIR BMI    Body Mass Index: 42.45 kg/m     Reproductive/Obstetrics negative OB ROS                              Anesthesia Physical Anesthesia Plan  ASA: 3  Anesthesia Plan: General   Post-op Pain Management:    Induction:   PONV Risk Score and Plan:   Airway Management Planned:   Additional Equipment:   Intra-op Plan:   Post-operative Plan:   Informed Consent: I have reviewed the patients History and Physical, chart, labs and discussed the procedure  including the risks, benefits and alternatives for the proposed anesthesia with the patient or authorized representative who has indicated his/her understanding and acceptance.     Dental Advisory Given  Plan Discussed with: CRNA  Anesthesia Plan Comments:         Anesthesia Quick Evaluation

## 2024-11-19 NOTE — Op Note (Signed)
°  PRE-OPERATIVE DIAGNOSIS:  Recurrent Anal fissure   POST-OPERATIVE DIAGNOSIS:  Anal fissure   PROCEDURE:   1. Anorectal Exam under Anesthesia 2. Bilateral Chemical Lateral internal Sphincterotomy using 100 IU Botox .  50 units applied to the left and 50 units applied to the right of the internal sphincter   SURGEON:  Surgeon(s) and Role:    * Arnetia Bronk F, MD - Primary   FINDINGS: posterior midline fissure   EBL: minimal   ANESTHESIA: General      DICTATION:  The Patient was explained about the  Procedure in detail. Risks, benefits and possible complications ( including but not limited to recurrence, transient incontinence, pain, bleeding)  and a consent was obtained. The patient taken to the operating room and placed in the lithotomy position.    Exam under anesthesia using the anal speculum revealed a good size posterior midline fissure. .  No other intraluminal lesions were observed. Digital palpation was performed identifying the Internal Sphincter muscle to the left and on the lateral aspect and injected 50 international units of Botox  in the standard fashion. Identical procedure was done to the Right of the Internal Sphincter muscle by injection the other 50 Int units of botox . Liposomal Marcaine   was injected around the perianal site. Needle and laparotomy counts were correct and there were no immediate complications  Pt preoperatively requested morphine  PO for pain, I was candid and told him I did not feel comfortable prescribing this, I would do short course oxy and he will touch base with hi pain doctor.  Alainna Stawicki JULIANNA Luna, MD, FACS

## 2024-11-19 NOTE — Transfer of Care (Signed)
 Immediate Anesthesia Transfer of Care Note  Patient: Shane Erickson  Procedure(s) Performed: EXAM UNDER ANESTHESIA, RECTUM INJECTION, CHEMODENERVATION AGENT  Patient Location: PACU  Anesthesia Type:MAC  Level of Consciousness: awake  Airway & Oxygen Therapy: Patient Spontanous Breathing and Patient connected to nasal cannula oxygen  Post-op Assessment: Report given to RN and Post -op Vital signs reviewed and stable  Post vital signs: Reviewed and stable  Last Vitals:  Vitals Value Taken Time  BP 114/87 11/19/24 13:32  Temp    Pulse 84 11/19/24 13:37  Resp 15 11/19/24 13:37  SpO2 95 % 11/19/24 13:37  Vitals shown include unfiled device data.  Last Pain:  Vitals:   11/19/24 1142  TempSrc: Temporal  PainSc: 7          Complications: No notable events documented.

## 2024-11-20 ENCOUNTER — Ambulatory Visit (HOSPITAL_BASED_OUTPATIENT_CLINIC_OR_DEPARTMENT_OTHER): Payer: Worker's Compensation | Admitting: Physical Therapy

## 2024-11-20 ENCOUNTER — Encounter: Payer: Self-pay | Admitting: Surgery

## 2024-11-27 ENCOUNTER — Ambulatory Visit (HOSPITAL_BASED_OUTPATIENT_CLINIC_OR_DEPARTMENT_OTHER): Admitting: Physical Therapy

## 2024-11-29 ENCOUNTER — Ambulatory Visit (HOSPITAL_BASED_OUTPATIENT_CLINIC_OR_DEPARTMENT_OTHER): Admitting: Physical Therapy

## 2024-12-02 ENCOUNTER — Ambulatory Visit (HOSPITAL_BASED_OUTPATIENT_CLINIC_OR_DEPARTMENT_OTHER): Admitting: Physical Therapy

## 2024-12-04 ENCOUNTER — Encounter (HOSPITAL_BASED_OUTPATIENT_CLINIC_OR_DEPARTMENT_OTHER): Payer: Self-pay | Admitting: Physical Therapy

## 2024-12-04 ENCOUNTER — Ambulatory Visit (HOSPITAL_BASED_OUTPATIENT_CLINIC_OR_DEPARTMENT_OTHER): Admitting: Physical Therapy

## 2024-12-09 ENCOUNTER — Ambulatory Visit (HOSPITAL_BASED_OUTPATIENT_CLINIC_OR_DEPARTMENT_OTHER): Payer: Worker's Compensation | Attending: Orthopedic Surgery | Admitting: Physical Therapy

## 2024-12-09 ENCOUNTER — Encounter (HOSPITAL_BASED_OUTPATIENT_CLINIC_OR_DEPARTMENT_OTHER): Payer: Self-pay | Admitting: Physical Therapy

## 2024-12-09 DIAGNOSIS — M5459 Other low back pain: Secondary | ICD-10-CM | POA: Diagnosis present

## 2024-12-09 DIAGNOSIS — M25561 Pain in right knee: Secondary | ICD-10-CM | POA: Diagnosis present

## 2024-12-09 DIAGNOSIS — G8929 Other chronic pain: Secondary | ICD-10-CM | POA: Insufficient documentation

## 2024-12-09 DIAGNOSIS — M25562 Pain in left knee: Secondary | ICD-10-CM | POA: Insufficient documentation

## 2024-12-09 DIAGNOSIS — R2689 Other abnormalities of gait and mobility: Secondary | ICD-10-CM | POA: Insufficient documentation

## 2024-12-09 DIAGNOSIS — M6281 Muscle weakness (generalized): Secondary | ICD-10-CM | POA: Diagnosis present

## 2024-12-09 NOTE — Therapy (Signed)
 " OUTPATIENT PHYSICAL THERAPY THORACOLUMBAR TREATMENT         Re-Cert  Patient Name: Shane Erickson MRN: [id number] DOB:15-Jan-1976, 49 y.o., male Today's Date: 12/09/2024  END OF SESSION:  PT End of Session - 12/09/24 0942     Visit Number 4    Number of Visits 12    Date for Recertification  01/24/25    Authorization Type workers comp    PT Start Time 0930    PT Stop Time 1015    PT Time Calculation (min) 45 min    Activity Tolerance Patient tolerated treatment well    Behavior During Therapy WFL for tasks assessed/performed           Past Medical History:  Diagnosis Date   Anal fissure 03/05/2016   Anxiety    Arthritis    Depression    Hypertension    states under control with med., has been on med. x 2 yr.   Metabolic dysfunction-associated steatohepatitis (MASH)    Obesity (BMI 30-39.9)    OSA on CPAP    Pre-diabetes    Sigmoid diverticulosis    Past Surgical History:  Procedure Laterality Date   ARTHROSCOPY, ANKLE WITH DEBRIDEMENT Right 06/19/2024   Procedure: ARTHROSCOPY, ANKLE WITH DEBRIDEMENT;  Surgeon: Barton Drape, MD;  Location: [facility];  Service: Orthopedics;  Laterality: Right;  right ankle arthroscopy with peroneal tendons debridement with possible repair and/or transfer   INJECTION, CHEMODENERVATION AGENT N/A 08/13/2024   Procedure: INJECTION, CHEMODENERVATION AGENT;  Surgeon: Jordis Laneta FALCON, MD;  Location: [facility];  Service: General;  Laterality: N/A;   INJECTION, CHEMODENERVATION AGENT N/A 11/19/2024   Procedure: INJECTION, CHEMODENERVATION AGENT;  Surgeon: Jordis Laneta FALCON, MD;  Location: [facility];  Service: General;  Laterality: N/A;   MENISCUS REPAIR Left 03/22/2022   RECTAL EXAM UNDER ANESTHESIA N/A 08/13/2024   Procedure: EXAM UNDER ANESTHESIA, RECTUM;  Surgeon: Jordis Laneta FALCON, MD;  Location: [facility];  Service: General;  Laterality: N/A;   RECTAL EXAM UNDER ANESTHESIA N/A 11/19/2024   Procedure: EXAM UNDER ANESTHESIA, RECTUM;   Surgeon: Jordis Laneta FALCON, MD;  Location: [facility];  Service: General;  Laterality: N/A;   SHOULDER ARTHROSCOPY WITH DISTAL CLAVICLE RESECTION Right 02/09/2024   Procedure: SHOULDER ARTHROSCOPY WITH DISTAL CLAVICLE RESECTION;  Surgeon: Sharl Selinda Dover, MD;  Location: [facility];  Service: Orthopedics;  Laterality: Right;   SHOULDER ARTHROSCOPY WITH SUBACROMIAL DECOMPRESSION Right 02/09/2024   Procedure: SHOULDER ARTHROSCOPY WITH SUBACROMIAL DECOMPRESSION;  Surgeon: Sharl Selinda Dover, MD;  Location: [facility];  Service: Orthopedics;  Laterality: Right;   SPHINCTEROTOMY N/A 04/07/2016   Procedure: LATERAL INTERNAL SPHINCTEROTOMY;  Surgeon: Donnice Lunger, MD;  Location: [facility];  Service: General;  Laterality: N/A;   TENDON TRANSFER Right 06/19/2024   Procedure: TRANSFER, TENDON;  Surgeon: Barton Drape, MD;  Location: [facility];  Service: Orthopedics;  Laterality: Right;   UMBILICAL HERNIA REPAIR  12/05/2010   Patient Active Problem List   Diagnosis Date Noted   Anal fissure 08/13/2024   Metabolic dysfunction-associated steatohepatitis (MASH) 02/27/2024   Prediabetes 02/27/2024   OSA (obstructive sleep apnea) - possibly severe, on CPAP 02/27/2024   Primary hypertension 02/27/2024   Class 3 severe obesity with serious comorbidity and body mass index (BMI) of 40.0 to 44.9 in adult [facility]) 02/27/2024   Decreased GFR 02/27/2024   Hx of rectal sphincterotomy May 2017 04/07/2016   Paresthesia 04/29/2014    PCP: Nell Piety FNP  REFERRING PROVIDER: Donaciano Sprang MD  REFERRING DIAG: M54.2 (ICD-10-CM) - Cervicalgia  M17.0 (ICD-10-CM) - Bilateral primary osteoarthritis of knee   Rationale for Evaluation and Treatment: Rehabilitation  THERAPY DIAG:  Bilateral chronic knee pain  Muscle weakness (generalized)  Other abnormalities of gait and mobility  ONSET DATE: 08/14/23  SUBJECTIVE:                                                                                                                                                                                            SUBJECTIVE STATEMENT: Knees 5/10, lt knee 3/10; Rt hip 8/10, Lt 5/10.  No changes.  Did MRI of right knee yesterday.  May need TKR  POOL ACCESS: [employer] member  Initial subjective statement:  C5-7 messed up.  Should be orders for my neck, back and knees.  Need to have R THR after I lose 6 lbs, both hips need to be replaced. Going to pain management for my back.  Don't want to have any more surgery. Had ankle surgery 3 months ago. Doing land based PT at [facility] 2 x week (foe ankle, back and hips). I have access to a pool near my home  PERTINENT HISTORY:  Fall 9/9 fell into hole at work. Had multiple injuries including right wrist fx. Hurt back both shoulders, both knees, hips and back. Had spinal injection in March did not relieve pain. May 22 another spine injection s.. June 2nd arthroscopic left knee surgery for meniscus repair.   02/09/2024 Surgery SHOULDER ARTHROSCOPY WITH SUBACROMIAL DECOMPRESSION   PAIN:  Are you having pain? Yes: NPRS scale: 6/10 Pain location: hip/back neck/ generalized Pain description: ache, tingling into Lt hand and RLE to foot Aggravating factors: movement Relieving factors: unknown  PRECAUTIONS: None  RED FLAGS: None   WEIGHT BEARING RESTRICTIONS: No  FALLS:  Has patient fallen in last 6 months? No  LIVING ENVIRONMENT: Lives with: lives with their family Lives in: House/apartment Stairs: Yes: External: 16 steps; on right going up Has following equipment at home: None  OCCUPATION: out from work due to injury  PLOF: Independent  PATIENT GOALS: better movement, strengthening core  NEXT MD VISIT: 1/8 and 1/9  OBJECTIVE:  Note: Objective measures were completed at Evaluation unless otherwise noted.  DIAGNOSTIC FINDINGS:  The MRI did show a central disc protrusion with left greater than right  S1 nerve irritation.  Bone spur L5  PATIENT SURVEYS:  LEFS:18/80   12/10/23:19/80 COGNITION: Overall cognitive status: Within functional limits for tasks assessed     SENSATION: Numbness/tingling ulnar nerve distribution left Reports tingling bilat LE  POSTURE: rounded shoulders and forward head  PALPATION: Mild TTP joint lines bil knees   CERVICAL ROM:   Active ROM A/PROM (deg) eval  Flexion Limited 50%P!  Extension Limited 50%  Right lateral flexion Limited 50%P!  Left lateral flexion Limited 50% P!  Right rotation full  Left rotation Limited 25%   (Blank rows = not tested)  LUMBAR ROM:   AROM eval  Flexion FT to mid shin  Extension Full P!  Right lateral flexion Limited 25%P!  Left lateral flexion Limited 25%P!  Right rotation   Left rotation    (Blank rows = not tested)  LOWER EXTREMITY ROM:     Active  Right eval Left eval  Hip flexion    Hip extension    Hip abduction    Hip adduction    Hip internal rotation    Hip external rotation    Knee flexion 115 122  Knee extension    Ankle dorsiflexion    Ankle plantarflexion    Ankle inversion    Ankle eversion     (Blank rows = not tested)  LOWER EXTREMITY MMT:    MMT Right eval Left eval R / L 12/09/24  Hip flexion 25.4 37.3 21.9 / 35.0  Hip extension     Hip abduction 17.5 33.5 15.5 / 24.0  Hip adduction     Hip internal rotation     Hip external rotation     Knee flexion     Knee extension 32.6 24.0 13.8 / 24.0  Ankle dorsiflexion     Ankle plantarflexion     Ankle inversion     Ankle eversion      (Blank rows = not tested)  LUMBAR SPECIAL TESTS:  Slump test: Negative  FUNCTIONAL TESTS:  Timed up and go (TUG): 17.52   4 stage balance: passed      12/09/24: TUG 18.23 GAIT: Distance walked: 500 ft Assistive device utilized: None Level of assistance: Complete Independence Comments: bilat knee varus  TREATMENT  [facility] Adult PT Treatment:                                              Date: 12/09/24 Pt seen for aquatic therapy today.  Treatment took place in water 3.5-4.75 ft in depth at the [facility] pool. Temp of water was 91.  Pt entered/exited the pool via stairs independently with bil rail.  Re-cert testing - unsupported walking forward/ backward with arm swing, multiple laps  -Forward and backward march with kick - unsupported side stepping -> with arm add/abdct using blue hand floats ->side lunge - UE on yellow HB: alternating hamstring curls x 10; toe/heel raises x20; hip add/abdct crossing midline x10 ; hip flexion /extension x10;  -Solid black noodle stomp R/L; hip neutral then ext rotation -walking forward and back between exercises for recovery   Pt requires the buoyancy and hydrostatic pressure of water for support, and to offload joints by unweighting joint load by at least 50 % in navel deep water and by at least 75-80% in chest to neck deep water.  Viscosity of the water is needed for resistance of strengthening. Water current perturbations provides challenge to standing balance requiring increased core activation.  PATIENT EDUCATION:  Education details: reacquainting to aquatic therapy  Person educated: Patient Education method: Explanation Education comprehension: verbalized understanding  HOME EXERCISE PROGRAM: Pt indep with land based PT as per [facility]  Aquatic assigned last episode  but not issued/instructed Aquatic Access Code: [email or web address] URL: [email or web address] Date: 06/12/2024 Prepared by: Frankie Alma Mohiuddin  ASSESSMENT:   CLINICAL IMPRESSION: Pt with delay in skilled PT due to minor surgical intervention.  He returns today ready to re-engage. He was only able to attend 2 sessions.  Repeat testing (subjective and objective) for re-assess demonstrate no significant changes as noted in above  charts. He did have an MRI completed yesterday Rt knee as pt reports md's deciding of TKR necessary. Pt reports pain relief in bilateral knees with aquatic session once submerged and engaged in exercise.  Pain sensitivity elevated today initially. He will continue to benefit from skilled aquatic physical therapy intervention to improve function, reduce pain and reach stated goals.         From initial evaluation:  Patient is a 49 y.o. m who was seen today for physical therapy evaluation and treatment for cervicalgia and bilat knee OA.  Pt is under workers comp and reports [facility] working land based on all areas including cervical spine.  He prefers to focus and knee and back pain in aquatics working cervical spine at [employer]. He is well known to this clinic being seen for 6 weeks not returning in early July due to having an additional surgical procedure on ankle.  Initial injury in Sept 2024 from fall at work with surgical interventions repairing left shoulder, right ankle and bilateral knees. He is pending surgical intervention for hips.  He reports continued back (cervical through Lumbar) pain from fall.  He VU of the purpose and benefits of using the properties of water to allow for movement, some strengthening and pain management.  Testing identifies weakness in his hips and core but balance looks to be intact.  Her has had no falls.  He is a good candidate for aquatic intervention for instruction and improvement on toleration to end range movement and usage of the properties of water for pain management.  He understands he will need pool access going forward to benefit from long term pain management using water for his chronic conditions.    Of Note: his strength as compared to last episode in hips have reduced but in knees have improved.  OBJECTIVE IMPAIRMENTS: Abnormal gait, decreased activity tolerance, decreased balance, decreased endurance, decreased mobility, difficulty walking,  decreased strength, and pain.    ACTIVITY LIMITATIONS: carrying, lifting, bending, sitting, standing, squatting, sleeping, stairs, transfers, and locomotion level   PARTICIPATION LIMITATIONS: meal prep, cleaning, laundry, shopping, community activity, occupation, and yard work   PERSONAL FACTORS: Multiple orthopedic issues being delt with at same time are also affecting patient's functional outcome.    REHAB POTENTIAL: Good   CLINICAL DECISION MAKING: Evolving/moderate complexity   EVALUATION COMPLEXITY: Moderate   GOALS: Goals reviewed with patient? Yes  SHORT TERM GOALS: Target date: 11/05/24  Pt will tolerate full aquatic sessions consistently without increase in pain and with improving function to demonstrate good toleration and effectiveness of intervention.  Baseline: Goal status: Met 11/06/24  2.  Pt will consider gaining pool access for use of the properties of water for chronic conditions maintaining mobility and minimizing pain. Baseline:  Goal status: In progress 11/06/24    LONG TERM GOALS: Target date: 01/24/25  Pt to improve on LEFS by at  least 9 point to demonstrate statistically significant Improvement in function. Baseline: 18/80 Goal status: INITIAL  2.  Pt will be indep with final aquatic HEP for continued management of condition Baseline:  Goal status: INITIAL  3.  Pt will report decrease in pain by at least 50% (all areas) for improved toleration to activity/quality of life and to demonstrate improved management of pain.  Baseline:  Goal status: INITIAL  4.  Pt will improve strength in all areas listed by at least 5 lbs  to demonstrate improved overall physical function Baseline:  Goal status: INITIAL   PLAN:  PT FREQUENCY: 2x/week  PT DURATION: 6 weeks  PLANNED INTERVENTIONS: 97164- PT Re-evaluation, 97750- Physical Performance Testing, 97110-Therapeutic exercises, 97530- Therapeutic activity, 97112- Neuromuscular re-education, 97535- Self  Care, [id number]- Manual therapy, [id number]- Gait training, [phone]- Aquatic Therapy, [phone] (1-2 muscles), [id number] (3+ muscles)- Dry Needling, Patient/Family education, Balance training, Stair training, Taping, Joint mobilization, DME instructions, Cryotherapy, and Moist heat.  PLAN FOR NEXT SESSION: aquatic only: le/core strengthening/stretching; pain management; HEP  Ronal Foots) Ozzie Remmers MPT 12/09/2024 9:50 AM [facility] [address] [place], [place], [address] Phone: [phone]   Fax:  [phone]   "

## 2024-12-15 NOTE — Therapy (Signed)
 " OUTPATIENT PHYSICAL THERAPY THORACOLUMBAR TREATMENT         Re-Cert  Patient Name: Shane Erickson MRN: [id number] DOB:July 11, 1976, 49 y.o., male Today's Date: 12/15/2024  END OF SESSION:     Past Medical History:  Diagnosis Date   Anal fissure 03/05/2016   Anxiety    Arthritis    Depression    Hypertension    states under control with med., has been on med. x 2 yr.   Metabolic dysfunction-associated steatohepatitis (MASH)    Obesity (BMI 30-39.9)    OSA on CPAP    Pre-diabetes    Sigmoid diverticulosis    Past Surgical History:  Procedure Laterality Date   ARTHROSCOPY, ANKLE WITH DEBRIDEMENT Right 06/19/2024   Procedure: ARTHROSCOPY, ANKLE WITH DEBRIDEMENT;  Surgeon: Barton Drape, MD;  Location: [facility];  Service: Orthopedics;  Laterality: Right;  right ankle arthroscopy with peroneal tendons debridement with possible repair and/or transfer   INJECTION, CHEMODENERVATION AGENT N/A 08/13/2024   Procedure: INJECTION, CHEMODENERVATION AGENT;  Surgeon: Jordis Laneta FALCON, MD;  Location: [facility];  Service: General;  Laterality: N/A;   INJECTION, CHEMODENERVATION AGENT N/A 11/19/2024   Procedure: INJECTION, CHEMODENERVATION AGENT;  Surgeon: Jordis Laneta FALCON, MD;  Location: [facility];  Service: General;  Laterality: N/A;   MENISCUS REPAIR Left 03/22/2022   RECTAL EXAM UNDER ANESTHESIA N/A 08/13/2024   Procedure: EXAM UNDER ANESTHESIA, RECTUM;  Surgeon: Jordis Laneta FALCON, MD;  Location: [facility];  Service: General;  Laterality: N/A;   RECTAL EXAM UNDER ANESTHESIA N/A 11/19/2024   Procedure: EXAM UNDER ANESTHESIA, RECTUM;  Surgeon: Jordis Laneta FALCON, MD;  Location: [facility];  Service: General;  Laterality: N/A;   SHOULDER ARTHROSCOPY WITH DISTAL CLAVICLE RESECTION Right 02/09/2024   Procedure: SHOULDER ARTHROSCOPY WITH DISTAL CLAVICLE RESECTION;  Surgeon: Sharl Selinda Dover, MD;  Location: [facility];  Service: Orthopedics;  Laterality: Right;   SHOULDER  ARTHROSCOPY WITH SUBACROMIAL DECOMPRESSION Right 02/09/2024   Procedure: SHOULDER ARTHROSCOPY WITH SUBACROMIAL DECOMPRESSION;  Surgeon: Sharl Selinda Dover, MD;  Location: [facility];  Service: Orthopedics;  Laterality: Right;   SPHINCTEROTOMY N/A 04/07/2016   Procedure: LATERAL INTERNAL SPHINCTEROTOMY;  Surgeon: Donnice Lunger, MD;  Location: [facility];  Service: General;  Laterality: N/A;   TENDON TRANSFER Right 06/19/2024   Procedure: TRANSFER, TENDON;  Surgeon: Barton Drape, MD;  Location:  SUR[facility]ERY C[facility];  Service: Orthopedics;  Laterality: Right;   UMBILICAL HERNIA REPAIR  12/05/2010   Patient Active Problem List   Diagnosis Date Noted   Anal fissure 08/13/2024   Metabolic dysfunction-associated steatohepatitis (MASH) 02/27/2024   Prediabetes 02/27/2024   OSA (obstructive sleep apnea) - possibly severe, on CPAP 02/27/2024   Primary hypertension 02/27/2024   Class 3 severe obesity with serious comorbidity and body mass index (BMI) of 40.0 to 44.9 in adult [facility]) 02/27/2024   Decreased GFR 02/27/2024   Hx of rectal sphincterotomy May 2017 04/07/2016   Paresthesia 04/29/2014    PCP: Nell Piety FNP   REFERRING PROVIDER: Donaciano Sprang MD  REFERRING DIAG: M54.2 (ICD-10-CM) - Cervicalgia  M17.0 (ICD-10-CM) - Bilateral primary osteoarthritis of knee   Rationale for Evaluation and Treatment: Rehabilitation  THERAPY DIAG:  No diagnosis found.  ONSET DATE: 08/14/23  SUBJECTIVE:  SUBJECTIVE STATEMENT: Knees 5/10, lt knee 3/10; Rt hip 8/10, Lt 5/10.  No changes.  Did MRI of right knee yesterday.  May need TKR  POOL ACCESS: [employer] member  Initial subjective statement:  C5-7 messed up.  Should be orders for my neck, back and knees.  Need to have R THR  after I lose 6 lbs, both hips need to be replaced. Going to pain management for my back.  Don't want to have any more surgery. Had ankle surgery 3 months ago. Doing land based PT at [facility] 2 x week (foe ankle, back and hips). I have access to a pool near my home  PERTINENT HISTORY:  Fall 9/9 fell into hole at work. Had multiple injuries including right wrist fx. Hurt back both shoulders, both knees, hips and back. Had spinal injection in March did not relieve pain. May 22 another spine injection s.. June 2nd arthroscopic left knee surgery for meniscus repair.   02/09/2024 Surgery SHOULDER ARTHROSCOPY WITH SUBACROMIAL DECOMPRESSION   PAIN:  Are you having pain? Yes: NPRS scale: 6/10 Pain location: hip/back neck/ generalized Pain description: ache, tingling into Lt hand and RLE to foot Aggravating factors: movement Relieving factors: unknown  PRECAUTIONS: None  RED FLAGS: None   WEIGHT BEARING RESTRICTIONS: No  FALLS:  Has patient fallen in last 6 months? No  LIVING ENVIRONMENT: Lives with: lives with their family Lives in: House/apartment Stairs: Yes: External: 16 steps; on right going up Has following equipment at home: None  OCCUPATION: out from work due to injury  PLOF: Independent  PATIENT GOALS: better movement, strengthening core  NEXT MD VISIT: 1/8 and 1/9  OBJECTIVE:  Note: Objective measures were completed at Evaluation unless otherwise noted.  DIAGNOSTIC FINDINGS:  The MRI did show a central disc protrusion with left greater than right S1 nerve irritation.  Bone spur L5  PATIENT SURVEYS:  LEFS:18/80   12/10/23:19/80 COGNITION: Overall cognitive status: Within functional limits for tasks assessed     SENSATION: Numbness/tingling ulnar nerve distribution left Reports tingling bilat LE  POSTURE: rounded shoulders and forward head  PALPATION: Mild TTP joint lines bil knees   CERVICAL ROM:   Active ROM A/PROM (deg) eval  Flexion Limited 50%P!   Extension Limited 50%  Right lateral flexion Limited 50%P!  Left lateral flexion Limited 50% P!  Right rotation full  Left rotation Limited 25%   (Blank rows = not tested)  LUMBAR ROM:   AROM eval  Flexion FT to mid shin  Extension Full P!  Right lateral flexion Limited 25%P!  Left lateral flexion Limited 25%P!  Right rotation   Left rotation    (Blank rows = not tested)  LOWER EXTREMITY ROM:     Active  Right eval Left eval  Hip flexion    Hip extension    Hip abduction    Hip adduction    Hip internal rotation    Hip external rotation    Knee flexion 115 122  Knee extension    Ankle dorsiflexion    Ankle plantarflexion    Ankle inversion    Ankle eversion     (Blank rows = not tested)  LOWER EXTREMITY MMT:    MMT Right eval Left eval R / L 12/09/24  Hip flexion 25.4 37.3 21.9 / 35.0  Hip extension     Hip abduction 17.5 33.5 15.5 / 24.0  Hip adduction     Hip internal rotation     Hip external rotation  Knee flexion     Knee extension 32.6 24.0 13.8 / 24.0  Ankle dorsiflexion     Ankle plantarflexion     Ankle inversion     Ankle eversion      (Blank rows = not tested)  LUMBAR SPECIAL TESTS:  Slump test: Negative  FUNCTIONAL TESTS:  Timed up and go (TUG): 17.52   4 stage balance: passed      12/09/24: TUG 18.23 GAIT: Distance walked: 500 ft Assistive device utilized: None Level of assistance: Complete Independence Comments: bilat knee varus  TREATMENT  [facility] Adult PT Treatment:                                             Date: 12/09/24 Pt seen for aquatic therapy today.  Treatment took place in water 3.5-4.75 ft in depth at the [facility] pool. Temp of water was 91.  Pt entered/exited the pool via stairs independently with bil rail.  Re-cert testing - unsupported walking forward/ backward with arm swing, multiple laps  -Forward and backward march with kick - unsupported side stepping -> with arm add/abdct using blue hand floats  ->side lunge - UE on yellow HB: alternating hamstring curls x 10; toe/heel raises x20; hip add/abdct crossing midline x10 ; hip flexion /extension x10;  -Solid black noodle stomp R/L; hip neutral then ext rotation -walking forward and back between exercises for recovery   Pt requires the buoyancy and hydrostatic pressure of water for support, and to offload joints by unweighting joint load by at least 50 % in navel deep water and by at least 75-80% in chest to neck deep water.  Viscosity of the water is needed for resistance of strengthening. Water current perturbations provides challenge to standing balance requiring increased core activation.                                                                                                                                PATIENT EDUCATION:  Education details: reacquainting to aquatic therapy  Person educated: Patient Education method: Explanation Education comprehension: verbalized understanding  HOME EXERCISE PROGRAM: Pt indep with land based PT as per [facility]  Aquatic assigned last episode  but not issued/instructed Aquatic Access Code: [email or web address] URL: [email or web address] Date: 06/12/2024 Prepared by: Frankie Frankee Gritz  ASSESSMENT:   CLINICAL IMPRESSION: Pt with delay in skilled PT due to minor surgical intervention.  He returns today ready to re-engage. He was only able to attend 2 sessions.  Repeat testing (subjective and objective) for re-assess demonstrate no significant changes as noted in above charts. He did have an MRI completed yesterday Rt knee as pt reports md's deciding of TKR necessary. Pt reports pain relief in bilateral knees with aquatic session once submerged and engaged in exercise.  Pain sensitivity elevated today initially. He will continue to benefit  from skilled aquatic physical therapy intervention to improve function, reduce pain and reach stated goals.         From initial evaluation:   Patient is a 49 y.o. m who was seen today for physical therapy evaluation and treatment for cervicalgia and bilat knee OA.  Pt is under workers comp and reports [facility] working land based on all areas including cervical spine.  He prefers to focus and knee and back pain in aquatics working cervical spine at [employer]. He is well known to this clinic being seen for 6 weeks not returning in early July due to having an additional surgical procedure on ankle.  Initial injury in Sept 2024 from fall at work with surgical interventions repairing left shoulder, right ankle and bilateral knees. He is pending surgical intervention for hips.  He reports continued back (cervical through Lumbar) pain from fall.  He VU of the purpose and benefits of using the properties of water to allow for movement, some strengthening and pain management.  Testing identifies weakness in his hips and core but balance looks to be intact.  Her has had no falls.  He is a good candidate for aquatic intervention for instruction and improvement on toleration to end range movement and usage of the properties of water for pain management.  He understands he will need pool access going forward to benefit from long term pain management using water for his chronic conditions.    Of Note: his strength as compared to last episode in hips have reduced but in knees have improved.  OBJECTIVE IMPAIRMENTS: Abnormal gait, decreased activity tolerance, decreased balance, decreased endurance, decreased mobility, difficulty walking, decreased strength, and pain.    ACTIVITY LIMITATIONS: carrying, lifting, bending, sitting, standing, squatting, sleeping, stairs, transfers, and locomotion level   PARTICIPATION LIMITATIONS: meal prep, cleaning, laundry, shopping, community activity, occupation, and yard work   PERSONAL FACTORS: Multiple orthopedic issues being delt with at same time are also affecting patient's functional outcome.    REHAB POTENTIAL:  Good   CLINICAL DECISION MAKING: Evolving/moderate complexity   EVALUATION COMPLEXITY: Moderate   GOALS: Goals reviewed with patient? Yes  SHORT TERM GOALS: Target date: 11/05/24  Pt will tolerate full aquatic sessions consistently without increase in pain and with improving function to demonstrate good toleration and effectiveness of intervention.  Baseline: Goal status: Met 11/06/24  2.  Pt will consider gaining pool access for use of the properties of water for chronic conditions maintaining mobility and minimizing pain. Baseline:  Goal status: In progress 11/06/24    LONG TERM GOALS: Target date: 01/24/25  Pt to improve on LEFS by at least 9 point to demonstrate statistically significant Improvement in function. Baseline: 18/80 Goal status: INITIAL  2.  Pt will be indep with final aquatic HEP for continued management of condition Baseline:  Goal status: INITIAL  3.  Pt will report decrease in pain by at least 50% (all areas) for improved toleration to activity/quality of life and to demonstrate improved management of pain.  Baseline:  Goal status: INITIAL  4.  Pt will improve strength in all areas listed by at least 5 lbs  to demonstrate improved overall physical function Baseline:  Goal status: INITIAL   PLAN:  PT FREQUENCY: 2x/week  PT DURATION: 6 weeks  PLANNED INTERVENTIONS: 97164- PT Re-evaluation, 97750- Physical Performance Testing, 97110-Therapeutic exercises, 97530- Therapeutic activity, [id number]- Neuromuscular re-education, 97535- Self Care, [id number]- Manual therapy, [id number]- Gait training, [id number]- Aquatic Therapy, [phone] (1-2 muscles), [id number] (3+ muscles)- Dry  Needling, Patient/Family education, Balance training, Stair training, Taping, Joint mobilization, DME instructions, Cryotherapy, and Moist heat.  PLAN FOR NEXT SESSION: aquatic only: le/core strengthening/stretching; pain management; HEP  Ronal Foots) Mihail Prettyman MPT 12/15/2024 3:46 PM [facility]  [facility] [address] [place], [place], [address] Phone: [phone]   Fax:  [phone]   "

## 2024-12-16 ENCOUNTER — Ambulatory Visit: Admitting: Surgery

## 2024-12-16 ENCOUNTER — Ambulatory Visit (HOSPITAL_BASED_OUTPATIENT_CLINIC_OR_DEPARTMENT_OTHER): Payer: Worker's Compensation | Admitting: Physical Therapy

## 2024-12-16 ENCOUNTER — Encounter (HOSPITAL_BASED_OUTPATIENT_CLINIC_OR_DEPARTMENT_OTHER): Payer: Self-pay | Admitting: Physical Therapy

## 2024-12-16 ENCOUNTER — Encounter: Payer: Self-pay | Admitting: Surgery

## 2024-12-16 VITALS — BP 131/87 | HR 101 | Ht 72.0 in | Wt 309.0 lb

## 2024-12-16 DIAGNOSIS — G8929 Other chronic pain: Secondary | ICD-10-CM

## 2024-12-16 DIAGNOSIS — K602 Anal fissure, unspecified: Secondary | ICD-10-CM

## 2024-12-16 DIAGNOSIS — M25561 Pain in right knee: Secondary | ICD-10-CM | POA: Diagnosis not present

## 2024-12-16 DIAGNOSIS — R2689 Other abnormalities of gait and mobility: Secondary | ICD-10-CM

## 2024-12-16 DIAGNOSIS — M5459 Other low back pain: Secondary | ICD-10-CM

## 2024-12-16 DIAGNOSIS — M6281 Muscle weakness (generalized): Secondary | ICD-10-CM

## 2024-12-16 NOTE — Progress Notes (Signed)
 Outpatient Surgical Follow Up  12/16/2024  Shane Erickson is an 49 y.o. male.   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  He did have improvement of symptoms after most recent injection and  He reports significant improvement of rectal pain . No bleeding and he is happy    Past Medical History:  Diagnosis Date   Anal fissure 03/05/2016   Anxiety    Arthritis    Depression    Hypertension    states under control with med., has been on med. x 2 yr.   Metabolic dysfunction-associated steatohepatitis (MASH)    Obesity (BMI 30-39.9)    OSA on CPAP    Pre-diabetes    Sigmoid diverticulosis     Past Surgical History:  Procedure Laterality Date   ARTHROSCOPY, ANKLE WITH DEBRIDEMENT Right 06/19/2024   Procedure: ARTHROSCOPY, ANKLE WITH DEBRIDEMENT;  Surgeon: Shane Drape, MD;  Location: [facility];  Service: Orthopedics;  Laterality: Right;  right ankle arthroscopy with peroneal tendons debridement with possible repair and/or transfer   INJECTION, CHEMODENERVATION AGENT N/A 08/13/2024   Procedure: INJECTION, CHEMODENERVATION AGENT;  Surgeon: Shane Shane FALCON, MD;  Location: [facility];  Service: General;  Laterality: N/A;   INJECTION, CHEMODENERVATION AGENT N/A 11/19/2024   Procedure: INJECTION, CHEMODENERVATION AGENT;  Surgeon: Shane Shane FALCON, MD;  Location: [facility];  Service: General;  Laterality: N/A;   MENISCUS REPAIR Left 03/22/2022   RECTAL EXAM UNDER ANESTHESIA N/A 08/13/2024   Procedure: EXAM UNDER ANESTHESIA, RECTUM;  Surgeon: Shane Shane FALCON, MD;  Location: [facility];  Service: General;  Laterality: N/A;   RECTAL EXAM UNDER ANESTHESIA N/A 11/19/2024   Procedure: EXAM UNDER ANESTHESIA, RECTUM;  Surgeon: Shane Shane FALCON, MD;  Location: [facility];  Service: General;  Laterality: N/A;   SHOULDER ARTHROSCOPY WITH DISTAL CLAVICLE RESECTION Right 02/09/2024   Procedure:  SHOULDER ARTHROSCOPY WITH DISTAL CLAVICLE RESECTION;  Surgeon: Shane Selinda Dover, MD;  Location: [facility];  Service: Orthopedics;  Laterality: Right;   SHOULDER ARTHROSCOPY WITH SUBACROMIAL DECOMPRESSION Right 02/09/2024   Procedure: SHOULDER ARTHROSCOPY WITH SUBACROMIAL DECOMPRESSION;  Surgeon: Shane Selinda Dover, MD;  Location: [facility];  Service: Orthopedics;  Laterality: Right;   SPHINCTEROTOMY N/A 04/07/2016   Procedure: LATERAL INTERNAL SPHINCTEROTOMY;  Surgeon: Shane Lunger, MD;  Location: [facility];  Service: General;  Laterality: N/A;   TENDON TRANSFER Right 06/19/2024   Procedure: TRANSFER, TENDON;  Surgeon: Shane Drape, MD;  Location:  SUR[facility]ERY CENTER;  Service: Orthopedics;  Laterality: Right;   UMBILICAL HERNIA REPAIR  12/05/2010    Family History  Problem Relation Age of Onset   Kidney disease Father    CVA Father    Heart attack Father    Hypertension Father    CAD Father    Thyroid disease Mother    Multiple sclerosis Sister    CAD Paternal Uncle     Social History:  reports that he has quit smoking. His smoking use included e-cigarettes. He has been exposed to tobacco smoke. He has never used smokeless tobacco. He reports that he does not drink alcohol and does not use drugs.  Allergies: Allergies[1]  Medications reviewed.    ROS Full ROS performed and is otherwise negative other than what is stated in HPI   BP 131/87   Pulse (!) 101   Ht 6' (1.829 m)   [place] ROLLEN)  309 lb (140.2 kg)   SpO2 98%   BMI 41.91 kg/m   Physical Exam Vitals and nursing note reviewed. Exam conducted with a chaperone present.  Constitutional:      Appearance: Normal appearance. He is obese. He is not ill-appearing.  Pulmonary:     Effort: No respiratory distress.     Breath sounds: No stridor.  Abdominal:     General: Abdomen is flat. There is no distension.     Palpations: Abdomen is soft. There is no mass.      Tenderness: There is no abdominal tenderness. There is no guarding or rebound.     Hernia: No hernia is present.  Genitourinary:    Comments: Midline post anal fissure, much improved as compared to preop state, no infection Musculoskeletal:     Cervical back: Normal range of motion and neck supple.  Skin:    General: Skin is warm and dry.     Capillary Refill: Capillary refill takes less than 2 seconds.  Neurological:     General: No focal deficit present.     Mental Status: He is alert and oriented to person, place, and time.  Psychiatric:        Mood and Affect: Mood normal.        Behavior: Behavior normal.        Thought Content: Thought content normal.        Judgment: Judgment normal.    Assessment/Plan:\ Doing well w sxs relief after chemical sphincterotomy Continue nifedipine  cream and sitz baths  Option to return to Dr Debby but at this time he prefers to stay locally RTC 2 months  I personally spent a total of 20 minutes in the care of the patient today including performing a medically appropriate exam/evaluation, counseling and educating, placing orders, referring and communicating with other health care professionals, documenting clinical information in the EHR, independently interpreting and reviewing images studies and coordinating care.   Shane Luna, MD FACS General Surgeon      [1]  Allergies Allergen Reactions   Chapstick Rash    BLISTERS   Sunscreens Rash    BLISTERS   Porcine (Pork) Protein-Containing Drug Products Other (See Comments)    Preference    Soap Itching    [place]

## 2024-12-16 NOTE — Patient Instructions (Signed)

## 2024-12-18 ENCOUNTER — Ambulatory Visit (HOSPITAL_BASED_OUTPATIENT_CLINIC_OR_DEPARTMENT_OTHER): Payer: Self-pay | Admitting: Physical Therapy

## 2024-12-19 ENCOUNTER — Ambulatory Visit (HOSPITAL_BASED_OUTPATIENT_CLINIC_OR_DEPARTMENT_OTHER): Payer: Self-pay | Attending: Orthopedic Surgery | Admitting: Physical Therapy

## 2024-12-19 DIAGNOSIS — M6281 Muscle weakness (generalized): Secondary | ICD-10-CM | POA: Insufficient documentation

## 2024-12-19 DIAGNOSIS — M25561 Pain in right knee: Secondary | ICD-10-CM | POA: Diagnosis present

## 2024-12-19 DIAGNOSIS — M25562 Pain in left knee: Secondary | ICD-10-CM | POA: Diagnosis present

## 2024-12-19 DIAGNOSIS — M5459 Other low back pain: Secondary | ICD-10-CM | POA: Insufficient documentation

## 2024-12-19 DIAGNOSIS — G8929 Other chronic pain: Secondary | ICD-10-CM | POA: Insufficient documentation

## 2024-12-19 DIAGNOSIS — R2689 Other abnormalities of gait and mobility: Secondary | ICD-10-CM | POA: Diagnosis present

## 2024-12-19 NOTE — Therapy (Signed)
 " OUTPATIENT PHYSICAL THERAPY THORACOLUMBAR TREATMENT           Patient Name: Shane Erickson MRN: [id number] DOB:01-13-1976, 49 y.o., male Today's Date: 12/19/2024  END OF SESSION:  PT End of Session - 12/19/24 1407     Visit Number 6    Number of Visits 12    Date for Recertification  01/24/25    Authorization Type workers comp    PT Start Time 1400    PT Stop Time 1440    PT Time Calculation (min) 40 min    Activity Tolerance Patient tolerated treatment well    Behavior During Therapy WFL for tasks assessed/performed            Past Medical History:  Diagnosis Date   Anal fissure 03/05/2016   Anxiety    Arthritis    Depression    Hypertension    states under control with med., has been on med. x 2 yr.   Metabolic dysfunction-associated steatohepatitis (MASH)    Obesity (BMI 30-39.9)    OSA on CPAP    Pre-diabetes    Sigmoid diverticulosis    Past Surgical History:  Procedure Laterality Date   ARTHROSCOPY, ANKLE WITH DEBRIDEMENT Right 06/19/2024   Procedure: ARTHROSCOPY, ANKLE WITH DEBRIDEMENT;  Surgeon: Barton Drape, MD;  Location: [facility];  Service: Orthopedics;  Laterality: Right;  right ankle arthroscopy with peroneal tendons debridement with possible repair and/or transfer   INJECTION, CHEMODENERVATION AGENT N/A 08/13/2024   Procedure: INJECTION, CHEMODENERVATION AGENT;  Surgeon: Jordis Laneta FALCON, MD;  Location: [facility];  Service: General;  Laterality: N/A;   INJECTION, CHEMODENERVATION AGENT N/A 11/19/2024   Procedure: INJECTION, CHEMODENERVATION AGENT;  Surgeon: Jordis Laneta FALCON, MD;  Location: [facility];  Service: General;  Laterality: N/A;   MENISCUS REPAIR Left 03/22/2022   RECTAL EXAM UNDER ANESTHESIA N/A 08/13/2024   Procedure: EXAM UNDER ANESTHESIA, RECTUM;  Surgeon: Jordis Laneta FALCON, MD;  Location: [facility];  Service: General;  Laterality: N/A;   RECTAL EXAM UNDER ANESTHESIA N/A 11/19/2024   Procedure: EXAM UNDER ANESTHESIA, RECTUM;   Surgeon: Jordis Laneta FALCON, MD;  Location: [facility];  Service: General;  Laterality: N/A;   SHOULDER ARTHROSCOPY WITH DISTAL CLAVICLE RESECTION Right 02/09/2024   Procedure: SHOULDER ARTHROSCOPY WITH DISTAL CLAVICLE RESECTION;  Surgeon: Sharl Selinda Dover, MD;  Location: [facility];  Service: Orthopedics;  Laterality: Right;   SHOULDER ARTHROSCOPY WITH SUBACROMIAL DECOMPRESSION Right 02/09/2024   Procedure: SHOULDER ARTHROSCOPY WITH SUBACROMIAL DECOMPRESSION;  Surgeon: Sharl Selinda Dover, MD;  Location: [facility];  Service: Orthopedics;  Laterality: Right;   SPHINCTEROTOMY N/A 04/07/2016   Procedure: LATERAL INTERNAL SPHINCTEROTOMY;  Surgeon: Donnice Lunger, MD;  Location: [facility];  Service: General;  Laterality: N/A;   TENDON TRANSFER Right 06/19/2024   Procedure: TRANSFER, TENDON;  Surgeon: Barton Drape, MD;  Location: [facility];  Service: Orthopedics;  Laterality: Right;   UMBILICAL HERNIA REPAIR  12/05/2010   Patient Active Problem List   Diagnosis Date Noted   Anal fissure 08/13/2024   Metabolic dysfunction-associated steatohepatitis (MASH) 02/27/2024   Prediabetes 02/27/2024   OSA (obstructive sleep apnea) - possibly severe, on CPAP 02/27/2024   Primary hypertension 02/27/2024   Class 3 severe obesity with serious comorbidity and body mass index (BMI) of 40.0 to 44.9 in adult [facility]) 02/27/2024   Decreased GFR 02/27/2024   Hx of rectal sphincterotomy May 2017 04/07/2016   Paresthesia 04/29/2014    PCP: Nell Piety FNP  REFERRING PROVIDER: Donaciano Sprang MD  REFERRING DIAG: M54.2 (ICD-10-CM) - Cervicalgia  M17.0 (ICD-10-CM) - Bilateral primary osteoarthritis of knee   Rationale for Evaluation and Treatment: Rehabilitation  THERAPY DIAG:  Bilateral chronic knee pain  Muscle weakness (generalized)  Other abnormalities of gait and mobility  Other low back pain  ONSET DATE: 08/14/23  SUBJECTIVE:                                                                                                                                                                                            SUBJECTIVE STATEMENT: I always feel good when I get out of the water.  It helps more with my mobility than with my pain.      Lt Elbow surgery scheduled for 12/30/24.    POOL ACCESS: [employer] member  Initial subjective statement:  C5-7 messed up.  Should be orders for my neck, back and knees.  Need to have R THR after I lose 6 lbs, both hips need to be replaced. Going to pain management for my back.  Don't want to have any more surgery. Had ankle surgery 3 months ago. Doing land based PT at [facility] 2 x week (foe ankle, back and hips). I have access to a pool near my home  PERTINENT HISTORY:  Fall 9/9 fell into hole at work. Had multiple injuries including right wrist fx. Hurt back both shoulders, both knees, hips and back. Had spinal injection in March did not relieve pain. May 22 another spine injection s.. June 2nd arthroscopic left knee surgery for meniscus repair.   02/09/2024 Surgery SHOULDER ARTHROSCOPY WITH SUBACROMIAL DECOMPRESSION   PAIN:  Are you having pain? Yes: NPRS scale: 4/10- knees, 6/10 back Pain location: see above Pain description: ache,  Aggravating factors: movement Relieving factors: unknown  PRECAUTIONS: None  RED FLAGS: None   WEIGHT BEARING RESTRICTIONS: No  FALLS:  Has patient fallen in last 6 months? No  LIVING ENVIRONMENT: Lives with: lives with their family Lives in: House/apartment Stairs: Yes: External: 16 steps; on right going up Has following equipment at home: None  OCCUPATION: out from work due to injury  PLOF: Independent  PATIENT GOALS: better movement, strengthening core  NEXT MD VISIT:  OBJECTIVE:  Note: Objective measures were completed at Evaluation unless otherwise noted.  DIAGNOSTIC FINDINGS:  The MRI did show a central disc protrusion with left  greater than right S1 nerve irritation.  Bone spur L5  PATIENT SURVEYS:  LEFS:18/80   12/10/23:19/80 COGNITION: Overall cognitive status: Within functional limits for tasks assessed     SENSATION: Numbness/tingling ulnar nerve distribution left Reports tingling bilat LE  POSTURE: rounded shoulders and forward head  PALPATION: Mild TTP joint lines bil knees   CERVICAL ROM:   Active ROM A/PROM (deg) eval  Flexion Limited 50%P!  Extension Limited 50%  Right lateral flexion Limited 50%P!  Left lateral flexion Limited 50% P!  Right rotation full  Left rotation Limited 25%   (Blank rows = not tested)  LUMBAR ROM:   AROM eval  Flexion FT to mid shin  Extension Full P!  Right lateral flexion Limited 25%P!  Left lateral flexion Limited 25%P!  Right rotation   Left rotation    (Blank rows = not tested)  LOWER EXTREMITY ROM:     Active  Right eval Left eval  Hip flexion    Hip extension    Hip abduction    Hip adduction    Hip internal rotation    Hip external rotation    Knee flexion 115 122  Knee extension    Ankle dorsiflexion    Ankle plantarflexion    Ankle inversion    Ankle eversion     (Blank rows = not tested)  LOWER EXTREMITY MMT:    MMT Right eval Left eval R / L 12/09/24  Hip flexion 25.4 37.3 21.9 / 35.0  Hip extension     Hip abduction 17.5 33.5 15.5 / 24.0  Hip adduction     Hip internal rotation     Hip external rotation     Knee flexion     Knee extension 32.6 24.0 13.8 / 24.0  Ankle dorsiflexion     Ankle plantarflexion     Ankle inversion     Ankle eversion      (Blank rows = not tested)  LUMBAR SPECIAL TESTS:  Slump test: Negative  FUNCTIONAL TESTS:  Timed up and go (TUG): 17.52   4 stage balance: passed      12/09/24: TUG 18.23 GAIT: Distance walked: 500 ft Assistive device utilized: None Level of assistance: Complete Independence Comments: bilat knee varus  TREATMENT  [facility] Adult PT Treatment:                                              Date: 12/19/24 Pt seen for aquatic therapy today.  Treatment took place in water 3.5-4.75 ft in depth at the [facility] pool. Temp of water was 91.  Pt entered/exited the pool via stairs independently with bil rail.  - unsupported walking forward/ backward with arm swing, multiple laps  - unsupported side stepping -> with arm add/abdct using yellow hand floats  -Forward and backward march with kick -Solid black noodle stomp R/L, 10 slow/10 fast; repeated in hip abdct - standing balance on black solid noodle -> slow marching -> mini squats -SLS holding R/L x 20s-> opposite hand circles with black noodle cw & ccw x 10.  -Staggered stance with solid noodle pull down to thighs TrA set  - forward walking kicks - forward walking lunges with UE on yellow hand floats/ backward lunges / wide side lunges  - suspended cycling with white noodle post wrapped, hands resting on yellow hand floats   Pt requires the buoyancy and hydrostatic pressure of water for support, and to offload joints by unweighting joint load by at least 50 % in navel deep water and by at least 75-80% in chest to neck deep water.  Viscosity of the water is  needed for resistance of strengthening. Water current perturbations provides challenge to standing balance requiring increased core activation.                                                                                                                                PATIENT EDUCATION:  Education details: reacquainting to aquatic therapy  Person educated: Patient Education method: Explanation Education comprehension: verbalized understanding  HOME EXERCISE PROGRAM: Pt indep with land based PT as per [facility]  Aquatic assigned last episode  but not issued/instructed Aquatic Access Code: [email or web address] URL: [email or web address] Date: 06/12/2024 Prepared by: Frankie Ziemba  ASSESSMENT:   CLINICAL IMPRESSION: Pt reports he is  scheduled for a Lt elbow surgery 1/26. Pt reported good tolerance for exercises completed today in pool.  Will plan to create and instruct on aquatic HEP next 2 visits.  Discussed pt transitioning to independent exercise at local pool after next week. Pt is approaching their max potential in aquatic setting .  Plan to assess goals over next 2 visits.       Re-Cert:Pt with delay in skilled PT due to minor surgical intervention.  He returns today ready to re-engage. He was only able to attend 2 sessions.  Repeat testing (subjective and objective) for re-assess demonstrate no significant changes as noted in above charts. He did have an MRI completed yesterday Rt knee as pt reports md's deciding of TKR necessary. Pt reports pain relief in bilateral knees with aquatic session once submerged and engaged in exercise.  Pain sensitivity elevated today initially. He will continue to benefit from skilled aquatic physical therapy intervention to improve function, reduce pain and reach stated goals.  From initial evaluation:  Patient is a 49 y.o. m who was seen today for physical therapy evaluation and treatment for cervicalgia and bilat knee OA.  Pt is under workers comp and reports [facility] working land based on all areas including cervical spine.  He prefers to focus and knee and back pain in aquatics working cervical spine at [employer]. He is well known to this clinic being seen for 6 weeks not returning in early July due to having an additional surgical procedure on ankle.  Initial injury in Sept 2024 from fall at work with surgical interventions repairing left shoulder, right ankle and bilateral knees. He is pending surgical intervention for hips.  He reports continued back (cervical through Lumbar) pain from fall.  He VU of the purpose and benefits of using the properties of water to allow for movement, some strengthening and pain management.  Testing identifies weakness in his hips and core but balance  looks to be intact.  Her has had no falls.  He is a good candidate for aquatic intervention for instruction and improvement on toleration to end range movement and usage of the properties of water for pain management.  He understands he will need pool access going forward to benefit from long term pain management  using water for his chronic conditions.    Of Note: his strength as compared to last episode in hips have reduced but in knees have improved.  OBJECTIVE IMPAIRMENTS: Abnormal gait, decreased activity tolerance, decreased balance, decreased endurance, decreased mobility, difficulty walking, decreased strength, and pain.    ACTIVITY LIMITATIONS: carrying, lifting, bending, sitting, standing, squatting, sleeping, stairs, transfers, and locomotion level   PARTICIPATION LIMITATIONS: meal prep, cleaning, laundry, shopping, community activity, occupation, and yard work   PERSONAL FACTORS: Multiple orthopedic issues being delt with at same time are also affecting patient's functional outcome.    REHAB POTENTIAL: Good   CLINICAL DECISION MAKING: Evolving/moderate complexity   EVALUATION COMPLEXITY: Moderate   GOALS: Goals reviewed with patient? Yes  SHORT TERM GOALS: Target date: 11/05/24  Pt will tolerate full aquatic sessions consistently without increase in pain and with improving function to demonstrate good toleration and effectiveness of intervention.  Baseline: Goal status: Met 11/06/24  2.  Pt will consider gaining pool access for use of the properties of water for chronic conditions maintaining mobility and minimizing pain. Baseline:  Goal status: In progress 11/06/24    LONG TERM GOALS: Target date: 01/24/25  Pt to improve on LEFS by at least 9 point to demonstrate statistically significant Improvement in function. Baseline: 18/80 Goal status: INITIAL  2.  Pt will be indep with final aquatic HEP for continued management of condition Baseline:  Goal status: INITIAL  3.   Pt will report decrease in pain by at least 50% (all areas) for improved toleration to activity/quality of life and to demonstrate improved management of pain.  Baseline:  Goal status: INITIAL  4.  Pt will improve strength in all areas listed by at least 5 lbs  to demonstrate improved overall physical function Baseline:  Goal status: INITIAL   PLAN:  PT FREQUENCY: 2x/week  PT DURATION: 6 weeks  PLANNED INTERVENTIONS: 97164- PT Re-evaluation, 97750- Physical Performance Testing, 97110-Therapeutic exercises, 97530- Therapeutic activity, 97112- Neuromuscular re-education, 97535- Self Care, [id number]- Manual therapy, [id number]- Gait training, [phone]- Aquatic Therapy, [phone] (1-2 muscles), [id number] (3+ muscles)- Dry Needling, Patient/Family education, Balance training, Stair training, Taping, Joint mobilization, DME instructions, Cryotherapy, and Moist heat.  PLAN FOR NEXT SESSION: aquatic only: le/core strengthening/stretching; pain management; HEP  Delon Aquas, PTA 12/19/24 2:45 PM [facility] [address] [place], [place], [address] Phone: [phone]   Fax:  [phone]  "

## 2024-12-25 ENCOUNTER — Ambulatory Visit (HOSPITAL_BASED_OUTPATIENT_CLINIC_OR_DEPARTMENT_OTHER): Payer: Worker's Compensation | Admitting: Physical Therapy

## 2024-12-25 ENCOUNTER — Encounter (HOSPITAL_BASED_OUTPATIENT_CLINIC_OR_DEPARTMENT_OTHER): Payer: Self-pay | Admitting: Physical Therapy

## 2024-12-25 DIAGNOSIS — G8929 Other chronic pain: Secondary | ICD-10-CM

## 2024-12-25 DIAGNOSIS — R2689 Other abnormalities of gait and mobility: Secondary | ICD-10-CM

## 2024-12-25 DIAGNOSIS — M25561 Pain in right knee: Secondary | ICD-10-CM | POA: Diagnosis not present

## 2024-12-25 DIAGNOSIS — M6281 Muscle weakness (generalized): Secondary | ICD-10-CM

## 2024-12-25 NOTE — Therapy (Signed)
 " OUTPATIENT PHYSICAL THERAPY THORACOLUMBAR TREATMENT           Patient Name: Shane Erickson MRN: [id number] DOB:03-May-1976, 49 y.o., male Today's Date: 12/25/2024  END OF SESSION:  PT End of Session - 12/25/24 1407     Visit Number 7    Number of Visits 12    Date for Recertification  01/24/25    Authorization Type workers comp    PT Start Time 1406    PT Stop Time 1445    PT Time Calculation (min) 39 min    Activity Tolerance Patient tolerated treatment well    Behavior During Therapy WFL for tasks assessed/performed            Past Medical History:  Diagnosis Date   Anal fissure 03/05/2016   Anxiety    Arthritis    Depression    Hypertension    states under control with med., has been on med. x 2 yr.   Metabolic dysfunction-associated steatohepatitis (MASH)    Obesity (BMI 30-39.9)    OSA on CPAP    Pre-diabetes    Sigmoid diverticulosis    Past Surgical History:  Procedure Laterality Date   ARTHROSCOPY, ANKLE WITH DEBRIDEMENT Right 06/19/2024   Procedure: ARTHROSCOPY, ANKLE WITH DEBRIDEMENT;  Surgeon: Barton Drape, MD;  Location:  [facility];  Service: Orthopedics;  Laterality: Right;  right ankle arthroscopy with peroneal tendons debridement with possible repair and/or transfer   INJECTION, CHEMODENERVATION AGENT N/A 08/13/2024   Procedure: INJECTION, CHEMODENERVATION AGENT;  Surgeon: Jordis Laneta FALCON, MD;  Location: [facility];  Service: General;  Laterality: N/A;   INJECTION, CHEMODENERVATION AGENT N/A 11/19/2024   Procedure: INJECTION, CHEMODENERVATION AGENT;  Surgeon: Jordis Laneta FALCON, MD;  Location: [facility];  Service: General;  Laterality: N/A;   MENISCUS REPAIR Left 03/22/2022   RECTAL EXAM UNDER ANESTHESIA N/A 08/13/2024   Procedure: EXAM UNDER ANESTHESIA, RECTUM;  Surgeon: Jordis Laneta FALCON, MD;  Location: [facility];  Service: General;  Laterality: N/A;   RECTAL EXAM UNDER ANESTHESIA N/A 11/19/2024   Procedure: EXAM UNDER ANESTHESIA, RECTUM;   Surgeon: Jordis Laneta FALCON, MD;  Location: [facility];  Service: General;  Laterality: N/A;   SHOULDER ARTHROSCOPY WITH DISTAL CLAVICLE RESECTION Right 02/09/2024   Procedure: SHOULDER ARTHROSCOPY WITH DISTAL CLAVICLE RESECTION;  Surgeon: Sharl Selinda Dover, MD;  Location: [facility];  Service: Orthopedics;  Laterality: Right;   SHOULDER ARTHROSCOPY WITH SUBACROMIAL DECOMPRESSION Right 02/09/2024   Procedure: SHOULDER ARTHROSCOPY WITH SUBACROMIAL DECOMPRESSION;  Surgeon: Sharl Selinda Dover, MD;  Location: [facility];  Service: Orthopedics;  Laterality: Right;   SPHINCTEROTOMY N/A 04/07/2016   Procedure: LATERAL INTERNAL SPHINCTEROTOMY;  Surgeon: Donnice Lunger, MD;  Location: [facility];  Service: General;  Laterality: N/A;   TENDON TRANSFER Right 06/19/2024   Procedure: TRANSFER, TENDON;  Surgeon: Barton Drape, MD;  Location: [facility];  Service: Orthopedics;  Laterality: Right;   UMBILICAL HERNIA REPAIR  12/05/2010   Patient Active Problem List   Diagnosis Date Noted   Anal fissure 08/13/2024   Metabolic dysfunction-associated steatohepatitis (MASH) 02/27/2024   Prediabetes 02/27/2024   OSA (obstructive sleep apnea) - possibly severe, on CPAP 02/27/2024   Primary hypertension 02/27/2024   Class 3 severe obesity with serious comorbidity and body mass index (BMI) of 40.0 to 44.9 in adult [facility]) 02/27/2024   Decreased GFR 02/27/2024   Hx of rectal sphincterotomy May 2017 04/07/2016   Paresthesia 04/29/2014    PCP: Nell Piety FNP  REFERRING PROVIDER: Donaciano Sprang MD  REFERRING DIAG: M54.2 (ICD-10-CM) - Cervicalgia  M17.0 (ICD-10-CM) - Bilateral primary osteoarthritis of knee   Rationale for Evaluation and Treatment: Rehabilitation  THERAPY DIAG:  Bilateral chronic knee pain  Muscle weakness (generalized)  Other abnormalities of gait and mobility  ONSET DATE: 08/14/23  SUBJECTIVE:                                                                                                                                                                                            SUBJECTIVE STATEMENT: My knees feel pretty good all day until night time maybe 3-4/10    LBP worse than knee pain  Lt Elbow surgery scheduled for 12/30/24.    POOL ACCESS: [employer] member  Initial subjective statement:  C5-7 messed up.  Should be orders for my neck, back and knees.  Need to have R THR after I lose 6 lbs, both hips need to be replaced. Going to pain management for my back.  Don't want to have any more surgery. Had ankle surgery 3 months ago. Doing land based PT at [facility] 2 x week (foe ankle, back and hips). I have access to a pool near my home  PERTINENT HISTORY:  Fall 9/9 fell into hole at work. Had multiple injuries including right wrist fx. Hurt back both shoulders, both knees, hips and back. Had spinal injection in March did not relieve pain. May 22 another spine injection s.. June 2nd arthroscopic left knee surgery for meniscus repair.   02/09/2024 Surgery SHOULDER ARTHROSCOPY WITH SUBACROMIAL DECOMPRESSION   PAIN:  Are you having pain? Yes: NPRS scale: 4/10- knees, 6/10 back Pain location: see above Pain description: ache,  Aggravating factors: movement Relieving factors: unknown  PRECAUTIONS: None  RED FLAGS: None   WEIGHT BEARING RESTRICTIONS: No  FALLS:  Has patient fallen in last 6 months? No  LIVING ENVIRONMENT: Lives with: lives with their family Lives in: House/apartment Stairs: Yes: External: 16 steps; on right going up Has following equipment at home: None  OCCUPATION: out from work due to injury  PLOF: Independent  PATIENT GOALS: better movement, strengthening core  NEXT MD VISIT:  OBJECTIVE:  Note: Objective measures were completed at Evaluation unless otherwise noted.  DIAGNOSTIC FINDINGS:  The MRI did show a central disc protrusion with left greater than right S1 nerve  irritation.  Bone spur L5  PATIENT SURVEYS:  LEFS:18/80   12/10/23:19/80 COGNITION: Overall cognitive status: Within functional limits for tasks assessed     SENSATION: Numbness/tingling ulnar nerve distribution left Reports tingling bilat LE  POSTURE: rounded shoulders and forward head  PALPATION: Mild TTP  joint lines bil knees   CERVICAL ROM:   Active ROM A/PROM (deg) eval  Flexion Limited 50%P!  Extension Limited 50%  Right lateral flexion Limited 50%P!  Left lateral flexion Limited 50% P!  Right rotation full  Left rotation Limited 25%   (Blank rows = not tested)  LUMBAR ROM:   AROM eval  Flexion FT to mid shin  Extension Full P!  Right lateral flexion Limited 25%P!  Left lateral flexion Limited 25%P!  Right rotation   Left rotation    (Blank rows = not tested)  LOWER EXTREMITY ROM:     Active  Right eval Left eval  Hip flexion    Hip extension    Hip abduction    Hip adduction    Hip internal rotation    Hip external rotation    Knee flexion 115 122  Knee extension    Ankle dorsiflexion    Ankle plantarflexion    Ankle inversion    Ankle eversion     (Blank rows = not tested)  LOWER EXTREMITY MMT:    MMT Right eval Left eval R / L 12/09/24  Hip flexion 25.4 37.3 21.9 / 35.0  Hip extension     Hip abduction 17.5 33.5 15.5 / 24.0  Hip adduction     Hip internal rotation     Hip external rotation     Knee flexion     Knee extension 32.6 24.0 13.8 / 24.0  Ankle dorsiflexion     Ankle plantarflexion     Ankle inversion     Ankle eversion      (Blank rows = not tested)  LUMBAR SPECIAL TESTS:  Slump test: Negative  FUNCTIONAL TESTS:  Timed up and go (TUG): 17.52   4 stage balance: passed      12/09/24: TUG 18.23 GAIT: Distance walked: 500 ft Assistive device utilized: None Level of assistance: Complete Independence Comments: bilat knee varus  TREATMENT  [facility] Adult PT Treatment:                                             Date:  12/19/24 Pt seen for aquatic therapy today.  Treatment took place in water 3.5-4.75 ft in depth at the [facility] pool. Temp of water was 91.  Pt entered/exited the pool via stairs independently with bil rail.  - unsupported walking forward/ backward with arm swing, multiple laps  - unsupported side stepping -> with arm add/abdct using blue hand floats -> lunges -Forward and backward march with kick - forward walking lunges with UE on blue hand floats/ backward lunges -Solid black noodle stomp R/L, 10 slow/10 fast; repeated in hip abdct - standing balance on black solid noodle -> rotation forward and back heels/toes -> mini squats -SLS holding R/L x 20s-> ue add/abd x 10 blue HB -Staggered stance then wide with blue HB pull down to thighs TrA set  - suspended cycling with white noodle post wrapped, hands resting on yellow hand floats   Pt requires the buoyancy and hydrostatic pressure of water for support, and to offload joints by unweighting joint load by at least 50 % in navel deep water and by at least 75-80% in chest to neck deep water.  Viscosity of the water is needed for resistance of strengthening. Water current perturbations provides challenge to standing balance requiring increased core activation.  PATIENT EDUCATION:  Education details: reacquainting to aquatic therapy  Person educated: Patient Education method: Explanation Education comprehension: verbalized understanding  HOME EXERCISE PROGRAM: Pt indep with land based PT as per [facility]  Aquatic assigned last episode  but not issued/instructed Aquatic Access Code: [email or web address] URL: [email or web address] Date: 06/12/2024 Prepared by: Frankie Rahiem Schellinger  ASSESSMENT:   CLINICAL IMPRESSION: Pt completes session well with vc and demonstration. He demonstrates good execution and  maintains balance well with proprioceptive work.  No pain.  Final aquatic HEP created and printed but not instructed or issued. He is quickly approaching his max potential in setting and will be ready for DC after next session.     Re-Cert:Pt with delay in skilled PT due to minor surgical intervention.  He returns today ready to re-engage. He was only able to attend 2 sessions.  Repeat testing (subjective and objective) for re-assess demonstrate no significant changes as noted in above charts. He did have an MRI completed yesterday Rt knee as pt reports md's deciding of TKR necessary. Pt reports pain relief in bilateral knees with aquatic session once submerged and engaged in exercise.  Pain sensitivity elevated today initially. He will continue to benefit from skilled aquatic physical therapy intervention to improve function, reduce pain and reach stated goals.  From initial evaluation:  Patient is a 49 y.o. m who was seen today for physical therapy evaluation and treatment for cervicalgia and bilat knee OA.  Pt is under workers comp and reports [facility] working land based on all areas including cervical spine.  He prefers to focus and knee and back pain in aquatics working cervical spine at [employer]. He is well known to this clinic being seen for 6 weeks not returning in early July due to having an additional surgical procedure on ankle.  Initial injury in Sept 2024 from fall at work with surgical interventions repairing left shoulder, right ankle and bilateral knees. He is pending surgical intervention for hips.  He reports continued back (cervical through Lumbar) pain from fall.  He VU of the purpose and benefits of using the properties of water to allow for movement, some strengthening and pain management.  Testing identifies weakness in his hips and core but balance looks to be intact.  Her has had no falls.  He is a good candidate for aquatic intervention for instruction and improvement on  toleration to end range movement and usage of the properties of water for pain management.  He understands he will need pool access going forward to benefit from long term pain management using water for his chronic conditions.    Of Note: his strength as compared to last episode in hips have reduced but in knees have improved.  OBJECTIVE IMPAIRMENTS: Abnormal gait, decreased activity tolerance, decreased balance, decreased endurance, decreased mobility, difficulty walking, decreased strength, and pain.    ACTIVITY LIMITATIONS: carrying, lifting, bending, sitting, standing, squatting, sleeping, stairs, transfers, and locomotion level   PARTICIPATION LIMITATIONS: meal prep, cleaning, laundry, shopping, community activity, occupation, and yard work   PERSONAL FACTORS: Multiple orthopedic issues being delt with at same time are also affecting patient's functional outcome.    REHAB POTENTIAL: Good   CLINICAL DECISION MAKING: Evolving/moderate complexity   EVALUATION COMPLEXITY: Moderate   GOALS: Goals reviewed with patient? Yes  SHORT TERM GOALS: Target date: 11/05/24  Pt will tolerate full aquatic sessions consistently without increase in pain and with improving function to demonstrate good toleration and effectiveness of intervention.  Baseline: Goal status: Met  11/06/24  2.  Pt will consider gaining pool access for use of the properties of water for chronic conditions maintaining mobility and minimizing pain. Baseline:  Goal status: In progress 11/06/24    LONG TERM GOALS: Target date: 01/24/25  Pt to improve on LEFS by at least 9 point to demonstrate statistically significant Improvement in function. Baseline: 18/80 Goal status: INITIAL  2.  Pt will be indep with final aquatic HEP for continued management of condition Baseline:  Goal status: In progress 12/25/24  3.  Pt will report decrease in pain by at least 50% (all areas) for improved toleration to activity/quality of life  and to demonstrate improved management of pain.  Baseline:  Goal status: INITIAL  4.  Pt will improve strength in all areas listed by at least 5 lbs  to demonstrate improved overall physical function Baseline:  Goal status: INITIAL   PLAN:  PT FREQUENCY: 2x/week  PT DURATION: 6 weeks  PLANNED INTERVENTIONS: 97164- PT Re-evaluation, 97750- Physical Performance Testing, 97110-Therapeutic exercises, 97530- Therapeutic activity, 97112- Neuromuscular re-education, 97535- Self Care, [id number]- Manual therapy, [id number]- Gait training, [phone]- Aquatic Therapy, [phone] (1-2 muscles), [id number] (3+ muscles)- Dry Needling, Patient/Family education, Balance training, Stair training, Taping, Joint mobilization, DME instructions, Cryotherapy, and Moist heat.  PLAN FOR NEXT SESSION: aquatic only: le/core strengthening/stretching; pain management; HEP  Ronal Foots) Demontez Novack MPT 12/25/24 2:11 PM [facility] [address] [place], [place], [address] Phone: [phone]   Fax:  [phone]   "

## 2024-12-27 ENCOUNTER — Ambulatory Visit (HOSPITAL_BASED_OUTPATIENT_CLINIC_OR_DEPARTMENT_OTHER): Payer: Worker's Compensation | Admitting: Physical Therapy

## 2025-01-01 ENCOUNTER — Ambulatory Visit (HOSPITAL_BASED_OUTPATIENT_CLINIC_OR_DEPARTMENT_OTHER): Admitting: Physical Therapy

## 2025-01-02 ENCOUNTER — Ambulatory Visit (HOSPITAL_BASED_OUTPATIENT_CLINIC_OR_DEPARTMENT_OTHER): Payer: Self-pay | Admitting: Physical Therapy

## 2025-01-02 ENCOUNTER — Telehealth (HOSPITAL_BASED_OUTPATIENT_CLINIC_OR_DEPARTMENT_OTHER): Payer: Self-pay | Admitting: Physical Therapy
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
## 2025-01-03 ENCOUNTER — Ambulatory Visit (HOSPITAL_BASED_OUTPATIENT_CLINIC_OR_DEPARTMENT_OTHER): Admitting: Physical Therapy

## 2025-01-07 ENCOUNTER — Ambulatory Visit (HOSPITAL_BASED_OUTPATIENT_CLINIC_OR_DEPARTMENT_OTHER): Admitting: Physical Therapy

## 2025-01-10 ENCOUNTER — Ambulatory Visit (HOSPITAL_BASED_OUTPATIENT_CLINIC_OR_DEPARTMENT_OTHER): Admitting: Physical Therapy

## 2025-01-14 ENCOUNTER — Ambulatory Visit (HOSPITAL_BASED_OUTPATIENT_CLINIC_OR_DEPARTMENT_OTHER): Admitting: Physical Therapy

## 2025-01-17 ENCOUNTER — Ambulatory Visit (HOSPITAL_BASED_OUTPATIENT_CLINIC_OR_DEPARTMENT_OTHER): Admitting: Physical Therapy

## 2025-01-27 ENCOUNTER — Encounter: Admitting: Surgery
